# Patient Record
Sex: Male | Born: 1955 | Race: White | Hispanic: No | Marital: Married | Smoking: Former smoker
Health system: Southern US, Community
[De-identification: ages and names within clinical notes are randomized; demographics above are authoritative.]

## PROBLEM LIST (undated history)

## (undated) DIAGNOSIS — R740 Nonspecific elevation of levels of transaminase and lactic acid dehydrogenase [LDH]: Secondary | ICD-10-CM

## (undated) DIAGNOSIS — B009 Herpesviral infection, unspecified: Secondary | ICD-10-CM

## (undated) DIAGNOSIS — A0811 Acute gastroenteropathy due to Norwalk agent: Secondary | ICD-10-CM

## (undated) DIAGNOSIS — M545 Low back pain: Secondary | ICD-10-CM

## (undated) DIAGNOSIS — F1011 Alcohol abuse, in remission: Secondary | ICD-10-CM

## (undated) DIAGNOSIS — E785 Hyperlipidemia, unspecified: Secondary | ICD-10-CM

## (undated) DIAGNOSIS — K219 Gastro-esophageal reflux disease without esophagitis: Secondary | ICD-10-CM

## (undated) HISTORY — DX: Herpesviral infection, unspecified: B00.9

## (undated) HISTORY — DX: Gastro-esophageal reflux disease without esophagitis: K21.9

## (undated) HISTORY — PX: FUNCTIONAL ENDOSCOPIC SINUS SURGERY: SUR616

## (undated) HISTORY — PX: KNEE SURGERY: SHX244

## (undated) HISTORY — PX: TONSILLECTOMY: SUR1361

## (undated) HISTORY — DX: Alcohol abuse, in remission: F10.11

## (undated) HISTORY — PX: OTHER SURGICAL HISTORY: SHX169

## (undated) HISTORY — DX: Hyperlipidemia, unspecified: E78.5

## (undated) HISTORY — DX: Low back pain: M54.5

## (undated) HISTORY — PX: VASECTOMY: SHX75

## (undated) HISTORY — DX: Nonspecific elevation of levels of transaminase and lactic acid dehydrogenase (ldh): R74.0

---

## 2001-07-08 ENCOUNTER — Inpatient Hospital Stay (HOSPITAL_COMMUNITY): Admission: EM | Admit: 2001-07-08 | Discharge: 2001-07-09 | Payer: Self-pay | Admitting: Emergency Medicine

## 2001-07-08 ENCOUNTER — Encounter: Payer: Self-pay | Admitting: Emergency Medicine

## 2003-01-26 ENCOUNTER — Ambulatory Visit (HOSPITAL_BASED_OUTPATIENT_CLINIC_OR_DEPARTMENT_OTHER): Admission: RE | Admit: 2003-01-26 | Discharge: 2003-01-26 | Payer: Self-pay | Admitting: Orthopedic Surgery

## 2003-07-14 ENCOUNTER — Emergency Department (HOSPITAL_COMMUNITY): Admission: EM | Admit: 2003-07-14 | Discharge: 2003-07-14 | Payer: Self-pay | Admitting: Family Medicine

## 2003-07-17 ENCOUNTER — Emergency Department (HOSPITAL_COMMUNITY): Admission: EM | Admit: 2003-07-17 | Discharge: 2003-07-17 | Payer: Self-pay | Admitting: Family Medicine

## 2004-02-18 ENCOUNTER — Ambulatory Visit: Payer: Self-pay | Admitting: Internal Medicine

## 2004-02-26 ENCOUNTER — Ambulatory Visit: Payer: Self-pay | Admitting: Internal Medicine

## 2004-04-29 ENCOUNTER — Ambulatory Visit: Payer: Self-pay | Admitting: Internal Medicine

## 2004-05-24 ENCOUNTER — Ambulatory Visit (HOSPITAL_COMMUNITY): Admission: RE | Admit: 2004-05-24 | Discharge: 2004-05-24 | Payer: Self-pay | Admitting: Orthopedic Surgery

## 2004-07-01 ENCOUNTER — Ambulatory Visit: Payer: Self-pay | Admitting: Internal Medicine

## 2004-11-03 ENCOUNTER — Ambulatory Visit: Payer: Self-pay | Admitting: Internal Medicine

## 2004-11-24 ENCOUNTER — Ambulatory Visit: Payer: Self-pay | Admitting: Internal Medicine

## 2005-05-05 IMAGING — CR DG ORBITS FOR FOREIGN BODY
2 series · 2 of 2 positions shown · non-contrast
Comparison: none

CLINICAL DATA: Metal exposure to the eyes, pre-MRI evaluation.

EYE FOR FOREIGN BODY DETECTION
Two frontal views of the orbits demonstrate no orbital metallic foreign bodies. 
Unremarkable paranasal sinuses.
IMPRESSION
No orbital metallic foreign body.

[view not recorded (1 of 2)]
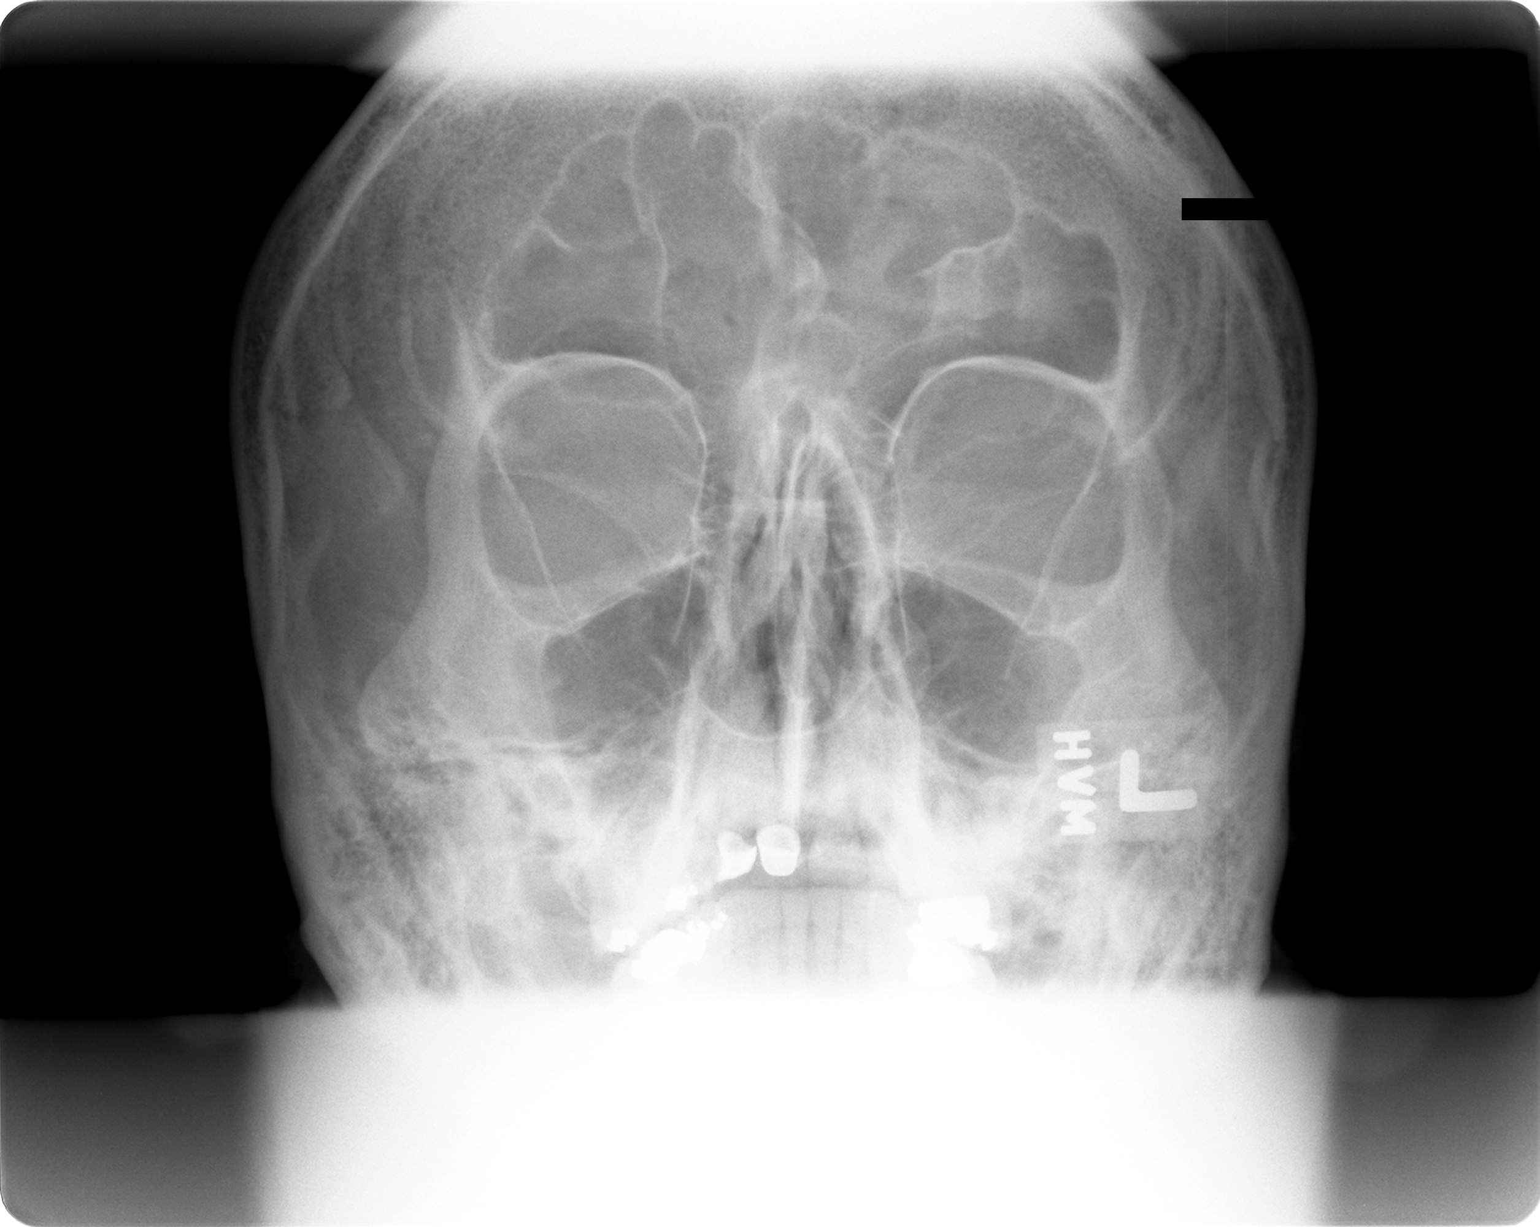

[view not recorded (2 of 2)]
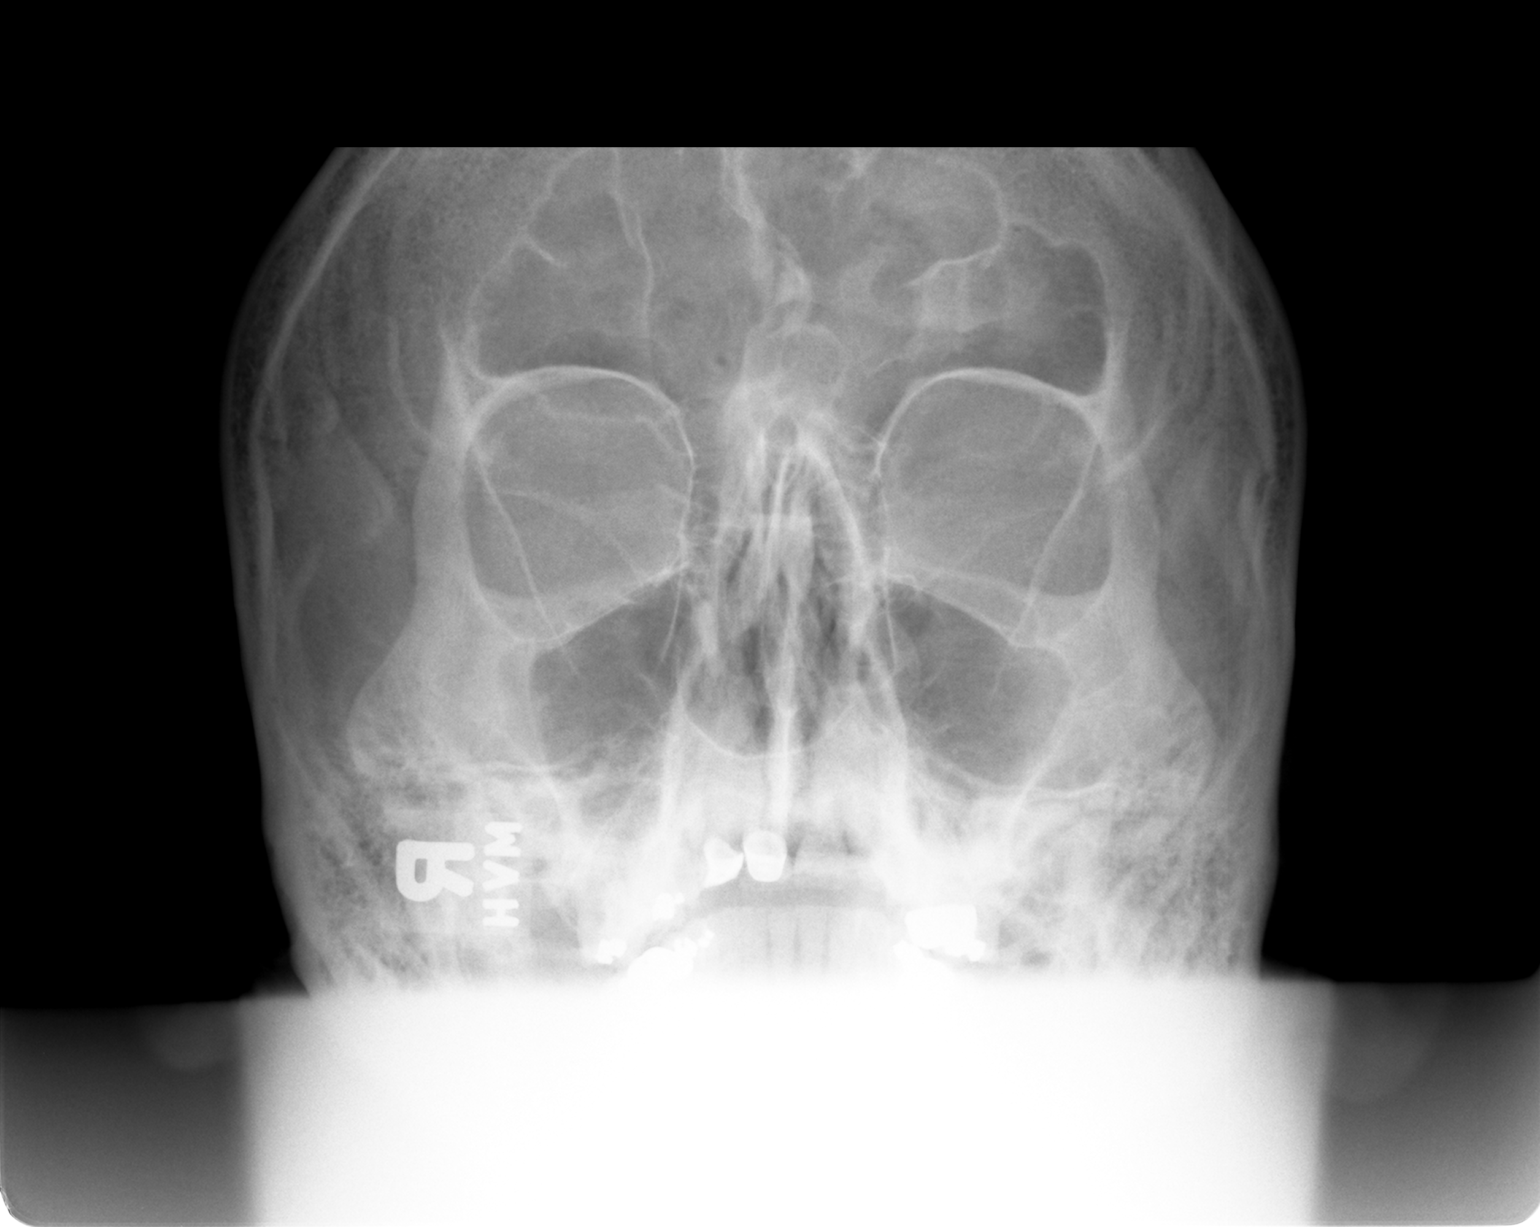

[2 of 2 positions shown; findings below may reference images not displayed]

## 2005-06-24 ENCOUNTER — Ambulatory Visit: Payer: Self-pay | Admitting: Internal Medicine

## 2005-07-25 ENCOUNTER — Emergency Department (HOSPITAL_COMMUNITY): Admission: EM | Admit: 2005-07-25 | Discharge: 2005-07-25 | Payer: Self-pay | Admitting: Family Medicine

## 2005-10-05 ENCOUNTER — Ambulatory Visit: Payer: Self-pay | Admitting: Internal Medicine

## 2006-09-23 ENCOUNTER — Ambulatory Visit: Payer: Self-pay | Admitting: Internal Medicine

## 2006-09-24 DIAGNOSIS — IMO0002 Reserved for concepts with insufficient information to code with codable children: Secondary | ICD-10-CM | POA: Insufficient documentation

## 2006-09-24 DIAGNOSIS — K219 Gastro-esophageal reflux disease without esophagitis: Secondary | ICD-10-CM | POA: Insufficient documentation

## 2006-09-24 DIAGNOSIS — E1165 Type 2 diabetes mellitus with hyperglycemia: Secondary | ICD-10-CM

## 2006-10-07 ENCOUNTER — Telehealth: Payer: Self-pay | Admitting: *Deleted

## 2007-08-01 ENCOUNTER — Emergency Department (HOSPITAL_COMMUNITY): Admission: EM | Admit: 2007-08-01 | Discharge: 2007-08-01 | Payer: Self-pay | Admitting: Emergency Medicine

## 2007-08-15 ENCOUNTER — Ambulatory Visit: Payer: Self-pay | Admitting: Internal Medicine

## 2007-08-15 LAB — CONVERTED CEMR LAB
ALT: 60 units/L — ABNORMAL HIGH (ref 0–53)
AST: 38 units/L — ABNORMAL HIGH (ref 0–37)
Albumin: 3.7 g/dL (ref 3.5–5.2)
Alkaline Phosphatase: 53 units/L (ref 39–117)
BUN: 15 mg/dL (ref 6–23)
Basophils Absolute: 0 10*3/uL (ref 0.0–0.1)
Basophils Relative: 0.1 % (ref 0.0–1.0)
Bilirubin Urine: NEGATIVE
Bilirubin, Direct: 0.1 mg/dL (ref 0.0–0.3)
Blood in Urine, dipstick: NEGATIVE
CO2: 30 meq/L (ref 19–32)
Calcium: 9.8 mg/dL (ref 8.4–10.5)
Chloride: 103 meq/L (ref 96–112)
Cholesterol: 239 mg/dL (ref 0–200)
Creatinine, Ser: 1.2 mg/dL (ref 0.4–1.5)
Direct LDL: 93.7 mg/dL
Eosinophils Absolute: 0.2 10*3/uL (ref 0.0–0.7)
Eosinophils Relative: 3 % (ref 0.0–5.0)
GFR calc Af Amer: 82 mL/min
GFR calc non Af Amer: 68 mL/min
Glucose, Bld: 122 mg/dL — ABNORMAL HIGH (ref 70–99)
Glucose, Urine, Semiquant: NEGATIVE
HCT: 40.6 % (ref 39.0–52.0)
HDL: 40.4 mg/dL (ref >39.0)
Hemoglobin: 14.5 g/dL (ref 13.0–17.0)
Ketones, urine, test strip: NEGATIVE
Lymphocytes Relative: 24.7 % (ref 12.0–46.0)
MCHC: 35.6 g/dL (ref 30.0–36.0)
MCV: 89.6 fL (ref 78.0–100.0)
Monocytes Absolute: 0.6 10*3/uL (ref 0.1–1.0)
Monocytes Relative: 8.5 % (ref 3.0–12.0)
Neutro Abs: 4.8 10*3/uL (ref 1.4–7.7)
Neutrophils Relative %: 63.7 % (ref 43.0–77.0)
Nitrite: NEGATIVE
PSA: 0.67 ng/mL (ref 0.10–4.00)
Platelets: 310 10*3/uL (ref 150–400)
Potassium: 3.5 meq/L (ref 3.5–5.1)
Protein, U semiquant: NEGATIVE
RBC: 4.53 M/uL (ref 4.22–5.81)
RDW: 13.2 % (ref 11.5–14.6)
Sodium: 140 meq/L (ref 135–145)
Specific Gravity, Urine: >=1.03
TSH: 1.11 microintl units/mL (ref 0.35–5.50)
Total Bilirubin: 1.1 mg/dL (ref 0.3–1.2)
Total CHOL/HDL Ratio: 5.9
Total Protein: 7.2 g/dL (ref 6.0–8.3)
Triglycerides: 245 mg/dL (ref 0–149)
Urobilinogen, UA: 0.2
VLDL: 49 mg/dL — ABNORMAL HIGH (ref 0–40)
WBC Urine, dipstick: NEGATIVE
WBC: 7.4 10*3/uL (ref 4.5–10.5)
pH: 5.5

## 2007-08-22 ENCOUNTER — Ambulatory Visit: Payer: Self-pay | Admitting: Internal Medicine

## 2007-08-22 DIAGNOSIS — N419 Inflammatory disease of prostate, unspecified: Secondary | ICD-10-CM | POA: Insufficient documentation

## 2007-08-22 DIAGNOSIS — E785 Hyperlipidemia, unspecified: Secondary | ICD-10-CM

## 2007-08-24 ENCOUNTER — Telehealth: Payer: Self-pay | Admitting: Internal Medicine

## 2007-08-26 ENCOUNTER — Ambulatory Visit: Payer: Self-pay | Admitting: Gastroenterology

## 2007-09-05 ENCOUNTER — Encounter: Payer: Self-pay | Admitting: Gastroenterology

## 2007-09-05 ENCOUNTER — Ambulatory Visit: Payer: Self-pay | Admitting: Gastroenterology

## 2007-09-08 ENCOUNTER — Encounter: Payer: Self-pay | Admitting: Gastroenterology

## 2008-07-06 ENCOUNTER — Telehealth: Payer: Self-pay | Admitting: Internal Medicine

## 2008-08-28 ENCOUNTER — Telehealth: Payer: Self-pay | Admitting: Internal Medicine

## 2008-09-11 ENCOUNTER — Ambulatory Visit: Payer: Self-pay | Admitting: Internal Medicine

## 2008-09-11 LAB — CONVERTED CEMR LAB: Hgb A1c MFr Bld: 7.4 % — ABNORMAL HIGH (ref 4.6–6.5)

## 2009-03-04 ENCOUNTER — Ambulatory Visit: Payer: Self-pay | Admitting: Internal Medicine

## 2009-03-04 DIAGNOSIS — R319 Hematuria, unspecified: Secondary | ICD-10-CM

## 2009-03-04 LAB — CONVERTED CEMR LAB
Bilirubin Urine: NEGATIVE
pH: 5

## 2010-03-31 ENCOUNTER — Ambulatory Visit
Admission: RE | Admit: 2010-03-31 | Discharge: 2010-03-31 | Payer: Self-pay | Source: Home / Self Care | Attending: Family Medicine | Admitting: Family Medicine

## 2010-04-13 LAB — CONVERTED CEMR LAB
Cholesterol, target level: 200 mg/dL
HDL goal, serum: 40 mg/dL
LDL Goal: 100 mg/dL

## 2010-04-17 NOTE — Assessment & Plan Note (Signed)
Summary: COUGH, CONGESTION // RS   Vital Signs:  Patient profile:   55 year old male Weight:      245 pounds O2 Sat:      97 % on Room air Temp:     98.0 degrees F oral Pulse rate:   110 / minute BP sitting:   120 / 80  (left arm) Cuff size:   regular  Vitals Entered By: Duard Brady LPN (March 31, 2010 3:17 PM)  O2 Flow:  Room air CC: c/o head and chest congestion, dry cough Is Patient Diabetic? Yes Did you bring your meter with you today? No   History of Present Illness: Patient seen with one-week history of illness.  Dominant symptoms of cough and nasal congestion. Intermittent sore throat. Cough is especially severe at night. Poor sleep secondary to cough. Cough mostly nonproductive. Some brownish nasal discharge. Possible low-grade fever over the weekend. Continues to have some chills and sweats. Getting ready to travel out of town. Nonsmoker.  Type 2 diabetes relatively stable.  Allergies (verified): No Known Drug Allergies  Past History:  Past Medical History: Last updated: 08/22/2007 GERD Herpes Diabetes mellitus, type II Hyperlipidemia PMH reviewed for relevance  Review of Systems       The patient complains of headaches.  The patient denies anorexia, weight loss, hoarseness, chest pain, syncope, dyspnea on exertion, peripheral edema, and hemoptysis.    Physical Exam  General:  patient coughing off and under an exam but in no respiratory distress Ears:  External ear exam shows no significant lesions or deformities.  Otoscopic examination reveals clear canals, tympanic membranes are intact bilaterally without bulging, retraction, inflammation or discharge. Hearing is grossly normal bilaterally. Mouth:  Oral mucosa and oropharynx without lesions or exudates.  Teeth in good repair. Neck:  No deformities, masses, or tenderness noted. Lungs:  Normal respiratory effort, chest expands symmetrically. Lungs are clear to auscultation, no crackles or  wheezes. Heart:  mild tachycardia.regular rhythm and no gallop.   Skin:  no rashes.   Cervical Nodes:  No lymphadenopathy noted   Impression & Recommendations:  Problem # 1:  URI (ICD-465.9)  His updated medication list for this problem includes:    Tussionex Pennkinetic Er 10-8 Mg/57ml Lqcr (Hydrocod polst-chlorphen polst) ..... One tsp by mouth q 12 hours as needed cough  Complete Medication List: 1)  Glimepiride 2 Mg Tabs (Glimepiride) .... Once daily 2)  Metformin Hcl 500 Mg Tabs (Metformin hcl) .Marland Kitchen.. 1 by mouth two times a day 3)  Azithromycin 250 Mg Tabs (Azithromycin) .... 2 by mouth today then one by mouth once daily for 4 days 4)  Tussionex Pennkinetic Er 10-8 Mg/86ml Lqcr (Hydrocod polst-chlorphen polst) .... One tsp by mouth q 12 hours as needed cough  Patient Instructions: 1)  Get plenty of rest, drink lots of clear liquids, and use Tylenol or Ibuprofen for fever and comfort. Return in 7-10 days if you're not better: sooner if you'er feeling worse.  Prescriptions: TUSSIONEX PENNKINETIC ER 10-8 MG/5ML LQCR (HYDROCOD POLST-CHLORPHEN POLST) one tsp by mouth q 12 hours as needed cough  #120 ml x 0   Entered and Authorized by:   Evelena Peat MD   Signed by:   Evelena Peat MD on 03/31/2010   Method used:   Print then Give to Patient   RxID:   [id number] AZITHROMYCIN 250 MG TABS (AZITHROMYCIN) 2 by mouth today then one by mouth once daily for 4 days  #6 x 0   Entered and Authorized  by:   Evelena Peat MD   Signed by:   Evelena Peat MD on 03/31/2010   Method used:   Print then Give to Patient   RxID:   [id number]    Orders Added: 1)  Est. Patient Level III [95284]

## 2010-05-29 ENCOUNTER — Telehealth: Payer: Self-pay | Admitting: *Deleted

## 2010-05-29 ENCOUNTER — Other Ambulatory Visit: Payer: Self-pay | Admitting: *Deleted

## 2010-05-29 MED ORDER — CIPROFLOXACIN HCL 500 MG PO TABS: 500.0000 mg | ORAL_TABLET | Freq: Two times a day (BID) | ORAL | Status: AC

## 2010-05-29 NOTE — Telephone Encounter (Signed)
Pt is having dysuria, and all the symptoms of prostatitis, and would like a pres. To be called to [facility]  He is in [place], but they will transfer it.

## 2010-05-29 NOTE — Telephone Encounter (Signed)
Per dr Lovell Sheehan- may have cipro 500 bid for 14 days

## 2010-05-29 NOTE — Telephone Encounter (Signed)
Error

## 2010-07-04 ENCOUNTER — Telehealth: Payer: Self-pay | Admitting: Internal Medicine

## 2010-07-04 DIAGNOSIS — E119 Type 2 diabetes mellitus without complications: Secondary | ICD-10-CM

## 2010-07-04 MED ORDER — GLIMEPIRIDE 2 MG PO TABS: 2.0000 mg | ORAL_TABLET | ORAL | Status: DC

## 2010-07-04 NOTE — Telephone Encounter (Signed)
Pt needs new rx glimepiride 2mg  sent to [facility] [phone]. [facility] of town he's back in town.

## 2010-07-21 ENCOUNTER — Other Ambulatory Visit: Payer: Self-pay | Admitting: Internal Medicine

## 2010-08-01 NOTE — Discharge Summary (Signed)
[place]  Patient:    SIGMOND, PATALANO Visit Number: [id number] MRN: [id number]          Service Type: MED Location: 2000 2004 01 Attending Physician:  Tresa Garter Dictated by:   Sonda Primes, M.D. [facility] Admit Date:  07/08/2001 Discharge Date: 07/09/2001   CC:         Stacie Glaze, M.D. [facility]   Discharge Summary  DATE OF BIRTH:  17-Apr-1955  DISCHARGE DIAGNOSES: 1. Cocaine chest pain, myocardial infarction ruled out. 2. Cocaine and alcohol abuse/addiction. 3. Elevated blood pressure. 4. Elevated blood sugar.  HISTORY OF PRESENT ILLNESS:  The patient is a 55 year old male who presented to the ER after using $600 worth of cocaine and drinking a case of beer.  He had some chest pain that has resolved in the emergency room.  For the details, please address my History and Physical.  HOSPITAL COURSE:  During the course of hospitalization, he was observed on telemetry.  His EKG in the morning was normal.  He has not recurrence of the chest pain.  He was placed on schedule of Ativan and some thiamine.  On the day of discharge, he is feeling well.  DISCHARGE PHYSICAL EXAMINATION:  GENERAL:  There is no chest pain or shortness of breath.  HEENT:  Moist mucosa.  NECK:  Supple.  LUNGS:  Clear with no wheezing or rales.  HEART:  Regular S1, S2 with no gallop.  ABDOMEN:  Soft, nontender, no organomegaly.  EXTREMITIES:  Lower extremities without edema.  NEUROLOGIC:  Cranial nerves II-XII intact.  Strength is normal.  She is alert, oriented and cooperative and denies being depressed.  DISCHARGE LABORATORY DATA AND X-RAY FINDINGS:  CK x3 normal, troponin x3 normal.  Hemoglobin 15.6, white count 13.2.  Urine drug screen positive for cocaine.  Urinalysis normal.  Alcohol level 80 (0-10).  Glucose 115.  Vitamin B12 level 597.  TSH 1.58.  EKG normal.  Chest x-ray normal.  DISCHARGE MEDICATIONS: 1. Baby aspirin one a  day. 2. Ativan 1 mg three times a day as needed for anxiety. 3. Multivitamin one a day.  DIET:  Low carbohydrate diet.  SPECIAL INSTRUCTIONS:  Call with problems.  Lose 20 pounds of weight.  Do not use alcohol or cocaine.  FOLLOWUP:  Follow up with Dr. Lovell Sheehan next week.  He will need a Cardiolite stress test as an outpatient. Dictated by:   Sonda Primes, M.D. [facility] Attending Physician:  Tresa Garter DD:  07/09/01 TD:  07/11/01 Job: [id number]

## 2010-08-27 ENCOUNTER — Other Ambulatory Visit (INDEPENDENT_AMBULATORY_CARE_PROVIDER_SITE_OTHER): Payer: PRIVATE HEALTH INSURANCE

## 2010-08-27 DIAGNOSIS — Z0000 Encounter for general adult medical examination without abnormal findings: Secondary | ICD-10-CM

## 2010-08-27 LAB — POCT URINALYSIS DIPSTICK
Bilirubin, UA: NEGATIVE
Leukocytes, UA: NEGATIVE
Nitrite, UA: NEGATIVE
Urobilinogen, UA: 0.2

## 2010-08-27 LAB — CBC WITH DIFFERENTIAL/PLATELET
Basophils Absolute: 0 10*3/uL (ref 0.0–0.1)
Basophils Relative: 0.4 % (ref 0.0–3.0)
Hemoglobin: 14.3 g/dL (ref 13.0–17.0)
Lymphocytes Relative: 22.9 % (ref 12.0–46.0)
Monocytes Relative: 6.7 % (ref 3.0–12.0)
Neutro Abs: 6.1 10*3/uL (ref 1.4–7.7)
Neutrophils Relative %: 67.3 % (ref 43.0–77.0)
RBC: 4.55 Mil/uL (ref 4.22–5.81)
RDW: 14 % (ref 11.5–14.6)

## 2010-08-27 LAB — MICROALBUMIN / CREATININE URINE RATIO
Creatinine,U: 152.1 mg/dL
Microalb, Ur: 3.9 mg/dL — ABNORMAL HIGH (ref 0.0–1.9)

## 2010-08-27 LAB — LIPID PANEL
Total CHOL/HDL Ratio: 6
Triglycerides: 542 mg/dL — ABNORMAL HIGH (ref 0.0–149.0)

## 2010-08-27 LAB — PSA: PSA: 0.61 ng/mL (ref 0.10–4.00)

## 2010-08-27 LAB — TSH: TSH: 1.14 u[IU]/mL (ref 0.35–5.50)

## 2010-08-27 LAB — HEPATIC FUNCTION PANEL
AST: 43 U/L — ABNORMAL HIGH (ref 0–37)
Albumin: 4.3 g/dL (ref 3.5–5.2)
Alkaline Phosphatase: 69 U/L (ref 39–117)
Bilirubin, Direct: 0.1 mg/dL (ref 0.0–0.3)

## 2010-08-27 LAB — BASIC METABOLIC PANEL
Calcium: 9.2 mg/dL (ref 8.4–10.5)
GFR: 90.82 mL/min (ref >60.00)
Sodium: 138 mEq/L (ref 135–145)

## 2010-09-02 ENCOUNTER — Encounter: Payer: Self-pay | Admitting: Internal Medicine

## 2010-09-09 ENCOUNTER — Ambulatory Visit (INDEPENDENT_AMBULATORY_CARE_PROVIDER_SITE_OTHER): Payer: PRIVATE HEALTH INSURANCE | Admitting: Internal Medicine

## 2010-09-09 VITALS — BP 124/80 | HR 86 | Temp 98.5°F | Resp 16 | Ht 67.0 in | Wt 233.0 lb

## 2010-09-09 DIAGNOSIS — E1165 Type 2 diabetes mellitus with hyperglycemia: Secondary | ICD-10-CM

## 2010-09-09 DIAGNOSIS — R7989 Other specified abnormal findings of blood chemistry: Secondary | ICD-10-CM

## 2010-09-09 DIAGNOSIS — F1010 Alcohol abuse, uncomplicated: Secondary | ICD-10-CM

## 2010-09-09 DIAGNOSIS — Z0000 Encounter for general adult medical examination without abnormal findings: Secondary | ICD-10-CM

## 2010-09-09 NOTE — Progress Notes (Signed)
  Subjective:    Patient ID: Rickey Anderson, male    DOB: 20-Feb-1956, 55 y.o.   MRN: [id number]  HPI The pt admits to etoh prior to labs weigth is down Patient has a history of problems with substance abuse including polysubstance abuse    Review of Systems  Constitutional: Negative for fever and fatigue.  HENT: Negative for hearing loss, congestion, neck pain and postnasal drip.   Eyes: Negative for discharge, redness and visual disturbance.  Respiratory: Negative for cough, shortness of breath and wheezing.   Cardiovascular: Negative for leg swelling.  Gastrointestinal: Negative for abdominal pain, constipation and abdominal distention.  Genitourinary: Negative for urgency and frequency.  Musculoskeletal: Negative for joint swelling and arthralgias.  Skin: Negative for color change and rash.  Neurological: Negative for weakness and light-headedness.  Hematological: Negative for adenopathy.  Psychiatric/Behavioral: Negative for behavioral problems.   Past Medical History  Diagnosis Date  . GERD (gastroesophageal reflux disease)   . Diabetes mellitus   . Hyperlipidemia   . Herpes    No past surgical history on file.  reports that he has quit smoking. He does not have any smokeless tobacco history on file. He reports that he drinks alcohol. He reports that he does not use illicit drugs. family history includes Alcohol abuse in an unspecified family member; Depression in an unspecified family member; Heart disease in an unspecified family member; and Hypertension in an unspecified family member. Allergies not on file     Objective:   Physical Exam  Constitutional: He is oriented to person, place, and time. He appears well-developed and well-nourished.  HENT:  Head: Normocephalic and atraumatic.  Eyes: Conjunctivae are normal. Pupils are equal, round, and reactive to light.  Neck: Normal range of motion. Neck supple.  Cardiovascular: Normal rate and regular rhythm.     Pulmonary/Chest: Effort normal and breath sounds normal.  Abdominal: Soft. Bowel sounds are normal.  Genitourinary: Rectum normal and prostate normal.  Musculoskeletal: He exhibits tenderness.  Neurological: He is alert and oriented to person, place, and time. He has normal reflexes.  Skin: Skin is warm and dry.  Psychiatric:       Strong component of denial          Assessment & Plan:  Patient must stop all alcohol.  He has fatty infiltration of his liver with diabetes and hypertension he has a triad of disease which may lead to cirrhosis and eventual liver failure.  His diabetes we are going to increase his Amaryl to 2 mg by mouth twice a day and continue his metformin he is urged to begin a weight loss and alcohol l free program   Patient presents for yearly preventative medicine examination.   all immunizations and health maintenance protocols were reviewed with the patient and they are up to date with these protocols.   screening laboratory values were reviewed with the patient including screening of hyperlipidemia PSA renal function and hepatic function.   There medications past medical history social history problem list and allergies were reviewed in detail.   Goals were established with regard to weight loss exercise diet in compliance with medications

## 2010-09-22 ENCOUNTER — Telehealth: Payer: Self-pay | Admitting: *Deleted

## 2010-09-22 MED ORDER — GLIMEPIRIDE 2 MG PO TABS: 2.0000 mg | ORAL_TABLET | Freq: Two times a day (BID) | ORAL | Status: DC

## 2010-09-22 NOTE — Telephone Encounter (Signed)
Amaryl 2 mg was increased at last ov.  Pt needs rx refill sent to [facility].

## 2010-09-22 NOTE — Telephone Encounter (Signed)
Med sent in.

## 2010-10-13 ENCOUNTER — Other Ambulatory Visit: Payer: Self-pay | Admitting: *Deleted

## 2010-10-13 MED ORDER — METFORMIN HCL 500 MG PO TABS: 500.0000 mg | ORAL_TABLET | Freq: Two times a day (BID) | ORAL | Status: DC

## 2010-10-14 ENCOUNTER — Telehealth: Payer: Self-pay | Admitting: *Deleted

## 2010-10-14 NOTE — Telephone Encounter (Addendum)
Pt is taking all his diabetic meds correctly (Amaryl (generic) is dosage is new.  He is exercising and dieting everyday, and cannot get his BS down below the 200's.  Please call with advice.  He is traveling x 2 weeks, and the number is [phone].  Pt called back, and is very concerned about this.  Talking about going to the pharmacy in [place] and getting name brand Amaryl.Marland Kitchen

## 2010-10-14 NOTE — Telephone Encounter (Signed)
Per dr Lovell Sheehan- increase metformin to 1000mg  twice a day

## 2010-10-14 NOTE — Telephone Encounter (Signed)
Notified pt. 

## 2010-11-04 ENCOUNTER — Inpatient Hospital Stay (INDEPENDENT_AMBULATORY_CARE_PROVIDER_SITE_OTHER)
Admission: RE | Admit: 2010-11-04 | Discharge: 2010-11-04 | Disposition: A | Discharge status: Home / Self Care | Payer: PRIVATE HEALTH INSURANCE | Source: Ambulatory Visit | Attending: Family Medicine | Admitting: Family Medicine

## 2010-11-04 DIAGNOSIS — M545 Low back pain, unspecified: Secondary | ICD-10-CM

## 2010-11-04 DIAGNOSIS — A6000 Herpesviral infection of urogenital system, unspecified: Secondary | ICD-10-CM

## 2010-11-04 DIAGNOSIS — E119 Type 2 diabetes mellitus without complications: Secondary | ICD-10-CM

## 2010-11-04 LAB — POCT URINALYSIS DIP (DEVICE)
Bilirubin Urine: NEGATIVE
Hgb urine dipstick: NEGATIVE
Ketones, ur: NEGATIVE mg/dL
Specific Gravity, Urine: 1.02 (ref 1.005–1.030)
pH: 5.5 (ref 5.0–8.0)

## 2010-11-04 LAB — POCT I-STAT, CHEM 8
BUN: 18 mg/dL (ref 6–23)
Calcium, Ion: 1.23 mmol/L (ref 1.12–1.32)
Chloride: 102 mEq/L (ref 96–112)
Creatinine, Ser: 0.9 mg/dL (ref 0.50–1.35)
Glucose, Bld: 115 mg/dL — ABNORMAL HIGH (ref 70–99)

## 2010-11-07 ENCOUNTER — Other Ambulatory Visit: Payer: Self-pay | Admitting: *Deleted

## 2010-11-07 MED ORDER — METFORMIN HCL 500 MG PO TABS: 1000.0000 mg | ORAL_TABLET | Freq: Two times a day (BID) | ORAL | Status: DC

## 2010-11-20 ENCOUNTER — Encounter: Payer: Self-pay | Admitting: Internal Medicine

## 2010-11-26 ENCOUNTER — Other Ambulatory Visit: Payer: Self-pay | Admitting: *Deleted

## 2010-11-26 MED ORDER — METFORMIN HCL 1000 MG PO TABS: 1000.0000 mg | ORAL_TABLET | Freq: Two times a day (BID) | ORAL | Status: DC

## 2010-11-26 MED ORDER — GLIMEPIRIDE 2 MG PO TABS: 2.0000 mg | ORAL_TABLET | Freq: Two times a day (BID) | ORAL | Status: DC

## 2010-12-04 ENCOUNTER — Telehealth: Payer: Self-pay | Admitting: Internal Medicine

## 2010-12-04 NOTE — Telephone Encounter (Signed)
Pt is sch for a1c on 01-19-2011. Pt would like to add lipid/liver to order. Can I sch?

## 2010-12-04 NOTE — Telephone Encounter (Signed)
Thanks norma

## 2010-12-04 NOTE — Telephone Encounter (Signed)
Yes you may-thanks 272.4

## 2010-12-05 NOTE — Telephone Encounter (Signed)
Pt is aware additional labs has been added to order

## 2010-12-08 ENCOUNTER — Other Ambulatory Visit: Payer: PRIVATE HEALTH INSURANCE

## 2010-12-10 LAB — POCT URINALYSIS DIP (DEVICE)
Bilirubin Urine: NEGATIVE
Ketones, ur: NEGATIVE
Operator id: 208841
Protein, ur: NEGATIVE
Specific Gravity, Urine: 1.025

## 2011-01-19 ENCOUNTER — Other Ambulatory Visit: Payer: PRIVATE HEALTH INSURANCE

## 2011-01-26 ENCOUNTER — Ambulatory Visit: Payer: PRIVATE HEALTH INSURANCE | Admitting: Internal Medicine

## 2011-02-09 ENCOUNTER — Other Ambulatory Visit: Payer: PRIVATE HEALTH INSURANCE

## 2011-02-16 ENCOUNTER — Ambulatory Visit: Payer: PRIVATE HEALTH INSURANCE | Admitting: Internal Medicine

## 2011-03-23 ENCOUNTER — Other Ambulatory Visit: Payer: PRIVATE HEALTH INSURANCE

## 2011-04-06 ENCOUNTER — Ambulatory Visit: Payer: PRIVATE HEALTH INSURANCE | Admitting: Internal Medicine

## 2011-05-04 ENCOUNTER — Ambulatory Visit (INDEPENDENT_AMBULATORY_CARE_PROVIDER_SITE_OTHER): Payer: PRIVATE HEALTH INSURANCE | Admitting: Family Medicine

## 2011-05-04 ENCOUNTER — Encounter: Payer: Self-pay | Admitting: Family Medicine

## 2011-05-04 VITALS — BP 120/84 | Temp 98.6°F | Wt 244.0 lb

## 2011-05-04 DIAGNOSIS — J069 Acute upper respiratory infection, unspecified: Secondary | ICD-10-CM

## 2011-05-04 MED ORDER — HYDROCODONE-HOMATROPINE 5-1.5 MG/5ML PO SYRP: ORAL_SOLUTION | ORAL | Status: DC

## 2011-05-04 NOTE — Progress Notes (Signed)
  Subjective:    Patient ID: Rickey Anderson, male    DOB: 02-13-1956, 56 y.o.   MRN: [id number]  HPI Terrius is a 56 year old married male nonsmoker patient of Dr. Lovell Sheehan who walks in today with symptoms of head congestion and cough for 6 hours!!!!!!!!!!  He has no fever  etc. His wife had a cold last week   Review of Systems General and pulmonary review of systems otherwise negative    Objective:   Physical Exam  Well-developed well-nourished overweight male in no acute distress examination of the HEENT negative neck was supple no adenopathy lungs are clear      Assessment & Plan:  Viral syndrome plan treat symptomatically with Tylenol lots of liquids Hydromet cough syrup

## 2011-05-04 NOTE — Patient Instructions (Signed)
Drink lots of water  Chloraseptic lozenge years helps with a sore throat  Hydromet 1/2-1 teaspoon up to 3 times daily. For cough and cold,,,,,,,,,,,, return when necessary

## 2011-06-07 ENCOUNTER — Other Ambulatory Visit: Payer: Self-pay | Admitting: Internal Medicine

## 2011-08-03 ENCOUNTER — Telehealth: Payer: Self-pay | Admitting: Internal Medicine

## 2011-08-03 NOTE — Telephone Encounter (Signed)
Pt would like to get lipid panel and A1c, and liver levels checked. Please advise

## 2011-08-03 NOTE — Telephone Encounter (Signed)
Pt was scheduled for these in the past but canceled, original appt was scheduled for 12/08/10 and had been scheduled 4 times since and has canceled every time. Please advise

## 2011-08-06 NOTE — Telephone Encounter (Signed)
Has not been seen by dr Lovell Sheehan in over a year- he needs ov and dr Lovell Sheehan can decided what labs he needs--

## 2011-08-11 ENCOUNTER — Telehealth: Payer: Self-pay | Admitting: Internal Medicine

## 2011-08-11 NOTE — Telephone Encounter (Signed)
Office visit given with padonda tomorrow

## 2011-08-11 NOTE — Telephone Encounter (Signed)
Patient came in stating that he is having severe left knee pain due to it being bone against bone. [facility] stated that it has been 4 years since his last visit so they will not prescribe anything. Patient will be seeing a physician at [facility] on 08/21/11. Patient has been taking 4 of the 200mg  ibuprofen and it is not working. Patient states he would like to have something called in or pick up an rx and it does not have to be a narcotic as he is going on a hiking trip Thurs. Please advise.

## 2011-08-12 ENCOUNTER — Encounter: Payer: Self-pay | Admitting: Family

## 2011-08-12 ENCOUNTER — Ambulatory Visit (INDEPENDENT_AMBULATORY_CARE_PROVIDER_SITE_OTHER): Payer: PRIVATE HEALTH INSURANCE | Admitting: Family

## 2011-08-12 VITALS — BP 140/90 | Temp 99.2°F | Wt 240.0 lb

## 2011-08-12 DIAGNOSIS — M25569 Pain in unspecified knee: Secondary | ICD-10-CM

## 2011-08-12 DIAGNOSIS — M1710 Unilateral primary osteoarthritis, unspecified knee: Secondary | ICD-10-CM

## 2011-08-12 MED ORDER — GLIMEPIRIDE 2 MG PO TABS: 2.0000 mg | ORAL_TABLET | Freq: Two times a day (BID) | ORAL | Status: DC

## 2011-08-12 MED ORDER — TRAMADOL HCL 50 MG PO TABS: 50.0000 mg | ORAL_TABLET | Freq: Three times a day (TID) | ORAL | Status: AC | PRN

## 2011-08-12 NOTE — Patient Instructions (Signed)
Knee Pain The knee is the complex joint between your thigh and your lower leg. It is made up of bones, tendons, ligaments, and cartilage. The bones that make up the knee are:  The femur in the thigh.   The tibia and fibula in the lower leg.   The patella or kneecap riding in the groove on the lower femur.  CAUSES  Knee pain is a common complaint with many causes. A few of these causes are:  Injury, such as:   A ruptured ligament or tendon injury.   Torn cartilage.   Medical conditions, such as:   Gout   Arthritis   Infections   Overuse, over training or overdoing a physical activity.  Knee pain can be minor or severe. Knee pain can accompany debilitating injury. Minor knee problems often respond well to self-care measures or get well on their own. More serious injuries may need medical intervention or even surgery. SYMPTOMS The knee is complex. Symptoms of knee problems can vary widely. Some of the problems are:  Pain with movement and weight bearing.   Swelling and tenderness.   Buckling of the knee.   Inability to straighten or extend your knee.   Your knee locks and you cannot straighten it.   Warmth and redness with pain and fever.   Deformity or dislocation of the kneecap.  DIAGNOSIS  Determining what is wrong may be very straight forward such as when there is an injury. It can also be challenging because of the complexity of the knee. Tests to make a diagnosis may include:  Your caregiver taking a history and doing a physical exam.   Routine X-rays can be used to rule out other problems. X-rays will not reveal a cartilage tear. Some injuries of the knee can be diagnosed by:   Arthroscopy a surgical technique by which a small video camera is inserted through tiny incisions on the sides of the knee. This procedure is used to examine and repair internal knee joint problems. Tiny instruments can be used during arthroscopy to repair the torn knee cartilage  (meniscus).   Arthrography is a radiology technique. A contrast liquid is directly injected into the knee joint. Internal structures of the knee joint then become visible on X-ray film.   An MRI scan is a non x-ray radiology procedure in which magnetic fields and a computer produce two- or three-dimensional images of the inside of the knee. Cartilage tears are often visible using an MRI scanner. MRI scans have largely replaced arthrography in diagnosing cartilage tears of the knee.   Blood work.   Examination of the fluid that helps to lubricate the knee joint (synovial fluid). This is done by taking a sample out using a needle and a syringe.  TREATMENT The treatment of knee problems depends on the cause. Some of these treatments are:  Depending on the injury, proper casting, splinting, surgery or physical therapy care will be needed.   Give yourself adequate recovery time. Do not overuse your joints. If you begin to get sore during workout routines, back off. Slow down or do fewer repetitions.   For repetitive activities such as cycling or running, maintain your strength and nutrition.   Alternate muscle groups. For example if you are a weight lifter, work the upper body on one day and the lower body the next.   Either tight or weak muscles do not give the proper support for your knee. Tight or weak muscles do not absorb the stress placed   on the knee joint. Keep the muscles surrounding the knee strong.   Take care of mechanical problems.   If you have flat feet, orthotics or special shoes may help. See your caregiver if you need help.   Arch supports, sometimes with wedges on the inner or outer aspect of the heel, can help. These can shift pressure away from the side of the knee most bothered by osteoarthritis.   A brace called an "unloader" brace also may be used to help ease the pressure on the most arthritic side of the knee.   If your caregiver has prescribed crutches, braces,  wraps or ice, use as directed. The acronym for this is PRICE. This means protection, rest, ice, compression and elevation.   Nonsteroidal anti-inflammatory drugs (NSAID's), can help relieve pain. But if taken immediately after an injury, they may actually increase swelling. Take NSAID's with food in your stomach. Stop them if you develop stomach problems. Do not take these if you have a history of ulcers, stomach pain or bleeding from the bowel. Do not take without your caregiver's approval if you have problems with fluid retention, heart failure, or kidney problems.   For ongoing knee problems, physical therapy may be helpful.   Glucosamine and chondroitin are over-the-counter dietary supplements. Both may help relieve the pain of osteoarthritis in the knee. These medicines are different from the usual anti-inflammatory drugs. Glucosamine may decrease the rate of cartilage destruction.   Injections of a corticosteroid drug into your knee joint may help reduce the symptoms of an arthritis flare-up. They may provide pain relief that lasts a few months. You may have to wait a few months between injections. The injections do have a small increased risk of infection, water retention and elevated blood sugar levels.   Hyaluronic acid injected into damaged joints may ease pain and provide lubrication. These injections may work by reducing inflammation. A series of shots may give relief for as long as 6 months.   Topical painkillers. Applying certain ointments to your skin may help relieve the pain and stiffness of osteoarthritis. Ask your pharmacist for suggestions. Many over the-counter products are approved for temporary relief of arthritis pain.   In some countries, doctors often prescribe topical NSAID's for relief of chronic conditions such as arthritis and tendinitis. A review of treatment with NSAID creams found that they worked as well as oral medications but without the serious side effects.    PREVENTION  Maintain a healthy weight. Extra pounds put more strain on your joints.   Get strong, stay limber. Weak muscles are a common cause of knee injuries. Stretching is important. Include flexibility exercises in your workouts.   Be smart about exercise. If you have osteoarthritis, chronic knee pain or recurring injuries, you may need to change the way you exercise. This does not mean you have to stop being active. If your knees ache after jogging or playing basketball, consider switching to swimming, water aerobics or other low-impact activities, at least for a few days a week. Sometimes limiting high-impact activities will provide relief.   Make sure your shoes fit well. Choose footwear that is right for your sport.   Protect your knees. Use the proper gear for knee-sensitive activities. Use kneepads when playing volleyball or laying carpet. Buckle your seat belt every time you drive. Most shattered kneecaps occur in car accidents.   Rest when you are tired.  SEEK MEDICAL CARE IF:  You have knee pain that is continual and does not   seem to be getting better.  SEEK IMMEDIATE MEDICAL CARE IF:  Your knee joint feels hot to the touch and you have a high fever. MAKE SURE YOU:   Understand these instructions.   Will watch your condition.   Will get help right away if you are not doing well or get worse.  Document Released: 12/28/2006 Document Revised: 02/19/2011 Document Reviewed: 12/28/2006 [employer] Patient Information 2012 [employer]. 

## 2011-08-12 NOTE — Progress Notes (Signed)
Subjective:    Patient ID: Rickey Anderson, male    DOB: 1955-07-10, 56 y.o.   MRN: [id number]  HPI 56 year old Caucasian male, former smoker, presents today with bilateral knee pain. He previously skied professionally and reports having issues with his knees for many years. Over the past 6 months he has noticed the pain increasing, especially following his spinning exercise for cardio. Rates the pain a 10/10 in his left knee and an 8/10 in his right knee and describes it as sore and achy. States he notices swelling to his left knee intermittently. Has an appointment with orthopedics next week.   Review of Systems  Constitutional: Negative.   HENT: Negative.   Eyes: Negative.   Respiratory: Negative.   Cardiovascular: Negative.   Gastrointestinal: Negative.   Genitourinary: Negative.   Musculoskeletal: Positive for joint swelling (intermittent ), arthralgias (bilateral knees) and gait problem (states he limps when pain level elevated).  Skin: Negative.   Neurological: Negative.   Hematological: Negative.   Psychiatric/Behavioral: Negative.    Past Medical History  Diagnosis Date  . GERD (gastroesophageal reflux disease)   . Diabetes mellitus   . Hyperlipidemia   . Herpes     History   Social History  . Marital Status: Married    Spouse Name: N/A    Number of Children: N/A  . Years of Education: N/A   Occupational History  . Not on file.   Social History Main Topics  . Smoking status: Former Games developer  . Smokeless tobacco: Not on file  . Alcohol Use: Yes  . Drug Use: No  . Sexually Active: Not on file   Other Topics Concern  . Not on file   Social History Narrative  . No narrative on file    No past surgical history on file.  Family History  Problem Relation Age of Onset  . Hypertension    . Heart disease    . Alcohol abuse    . Depression      No Known Allergies  Current Outpatient Prescriptions on File Prior to Visit  Medication Sig Dispense  Refill  . metFORMIN (GLUCOPHAGE) 1000 MG tablet Take 1 tablet (1,000 mg total) by mouth 2 (two) times daily.  60 tablet  11  . omeprazole (PRILOSEC) 20 MG capsule Take 20 mg by mouth daily.        Marland Kitchen DISCONTD: glimepiride (AMARYL) 2 MG tablet TAKE ONE TABLET BY MOUTH TWICE DAILY  60 tablet  3  . HYDROcodone-homatropine (HYCODAN) 5-1.5 MG/5ML syrup One half to 1 teaspoon 3 times daily when necessary for cough  240 mL  0    BP 140/90  Temp(Src) 99.2 F (37.3 C) (Oral)  Wt 240 lb (108.863 kg)chart    Objective:   Physical Exam  Constitutional: He is oriented to person, place, and time. He appears well-developed and well-nourished.  Cardiovascular: Normal rate, regular rhythm and normal heart sounds.  Exam reveals no gallop and no friction rub.   No murmur heard. Pulmonary/Chest: Effort normal and breath sounds normal. No respiratory distress. He has no wheezes. He has no rales.  Musculoskeletal:       Right knee: He exhibits normal range of motion, no swelling and no effusion. tenderness found.       Left knee: He exhibits decreased range of motion. He exhibits no swelling and no effusion. tenderness found.  Neurological: He is alert and oriented to person, place, and time.  Assessment & Plan:  Assessment: Bilateral knee pain  Plan: Start Tramadol 50 mg every eight hours as needed. Patient will follow up with orthopedics next week for his scheduled appointment.

## 2011-10-26 ENCOUNTER — Other Ambulatory Visit (INDEPENDENT_AMBULATORY_CARE_PROVIDER_SITE_OTHER): Payer: PRIVATE HEALTH INSURANCE

## 2011-10-26 DIAGNOSIS — Z0000 Encounter for general adult medical examination without abnormal findings: Secondary | ICD-10-CM

## 2011-10-26 LAB — CBC WITH DIFFERENTIAL/PLATELET
Basophils Absolute: 0.1 10*3/uL (ref 0.0–0.1)
Basophils Relative: 0.7 % (ref 0.0–3.0)
Eosinophils Absolute: 0.3 10*3/uL (ref 0.0–0.7)
HCT: 41.1 % (ref 39.0–52.0)
Hemoglobin: 13.5 g/dL (ref 13.0–17.0)
Lymphocytes Relative: 23 % (ref 12.0–46.0)
Lymphs Abs: 1.7 10*3/uL (ref 0.7–4.0)
MCHC: 32.8 g/dL (ref 30.0–36.0)
MCV: 91.6 fl (ref 78.0–100.0)
Monocytes Absolute: 0.7 10*3/uL (ref 0.1–1.0)
Neutro Abs: 4.8 10*3/uL (ref 1.4–7.7)
RBC: 4.49 Mil/uL (ref 4.22–5.81)
RDW: 14.4 % (ref 11.5–14.6)

## 2011-10-26 LAB — MICROALBUMIN / CREATININE URINE RATIO
Creatinine,U: 350.6 mg/dL
Microalb Creat Ratio: 1.8 mg/g (ref 0.0–30.0)

## 2011-10-26 LAB — POCT URINALYSIS DIPSTICK
Leukocytes, UA: NEGATIVE
Spec Grav, UA: >=1.03
pH, UA: 5

## 2011-10-26 LAB — BASIC METABOLIC PANEL
CO2: 30 mEq/L (ref 19–32)
Chloride: 98 mEq/L (ref 96–112)
Glucose, Bld: 175 mg/dL — ABNORMAL HIGH (ref 70–99)
Sodium: 137 mEq/L (ref 135–145)

## 2011-10-26 LAB — HEPATIC FUNCTION PANEL
Albumin: 4.1 g/dL (ref 3.5–5.2)
Alkaline Phosphatase: 73 U/L (ref 39–117)
Total Protein: 7.2 g/dL (ref 6.0–8.3)

## 2011-10-26 LAB — PSA: PSA: 0.59 ng/mL (ref 0.10–4.00)

## 2011-10-26 LAB — LIPID PANEL: Triglycerides: 251 mg/dL — ABNORMAL HIGH (ref 0.0–149.0)

## 2011-10-26 LAB — HEMOGLOBIN A1C: Hgb A1c MFr Bld: 9.9 % — ABNORMAL HIGH (ref 4.6–6.5)

## 2011-10-26 LAB — TSH: TSH: 1.5 u[IU]/mL (ref 0.35–5.50)

## 2011-10-30 ENCOUNTER — Telehealth: Payer: Self-pay | Admitting: Internal Medicine

## 2011-10-30 NOTE — Telephone Encounter (Signed)
Tell pt that dr Lovell Sheehan can check it the day of cpx and we can call him with results

## 2011-10-30 NOTE — Telephone Encounter (Addendum)
Pt was sch for cpx on 11-02-11 had to rsc cpx due to death in family. Pt would like to repeat a1c and any additional labs MD feels is necessary prior to cpx sch on 01-11-2012. Pt already had cpx labs. Pt is concern because cpx is 2 month later

## 2011-10-30 NOTE — Telephone Encounter (Signed)
Pt aware.

## 2011-11-02 ENCOUNTER — Encounter: Payer: PRIVATE HEALTH INSURANCE | Admitting: Internal Medicine

## 2011-11-04 ENCOUNTER — Encounter: Payer: Self-pay | Admitting: Internal Medicine

## 2011-11-04 ENCOUNTER — Ambulatory Visit (INDEPENDENT_AMBULATORY_CARE_PROVIDER_SITE_OTHER): Payer: PRIVATE HEALTH INSURANCE | Admitting: Internal Medicine

## 2011-11-04 ENCOUNTER — Telehealth: Payer: Self-pay | Admitting: Internal Medicine

## 2011-11-04 VITALS — BP 144/94 | Temp 97.7°F | Wt 234.0 lb

## 2011-11-04 DIAGNOSIS — E785 Hyperlipidemia, unspecified: Secondary | ICD-10-CM

## 2011-11-04 DIAGNOSIS — IMO0002 Reserved for concepts with insufficient information to code with codable children: Secondary | ICD-10-CM

## 2011-11-04 DIAGNOSIS — J329 Chronic sinusitis, unspecified: Secondary | ICD-10-CM

## 2011-11-04 DIAGNOSIS — E119 Type 2 diabetes mellitus without complications: Secondary | ICD-10-CM

## 2011-11-04 HISTORY — DX: Reserved for concepts with insufficient information to code with codable children: IMO0002

## 2011-11-04 MED ORDER — AMOXICILLIN-POT CLAVULANATE 875-125 MG PO TABS: 1.0000 | ORAL_TABLET | Freq: Two times a day (BID) | ORAL | Status: DC

## 2011-11-04 MED ORDER — INSULIN PEN NEEDLE 31G X 8 MM MISC: Status: DC

## 2011-11-04 MED ORDER — LIRAGLUTIDE 18 MG/3ML ~~LOC~~ SOLN: SUBCUTANEOUS | Status: DC

## 2011-11-04 NOTE — Telephone Encounter (Signed)
Pt is a [employer] on [address] and is needing to get a rx for One Touch Ultra Mini test strips. Pls call in asap.

## 2011-11-04 NOTE — Assessment & Plan Note (Addendum)
Patient has uncontrolled type 2 diabetes. His last A1c 9.9. Patient urged to start checking his blood sugar in the morning. We discussed natural history of type 2 diabetes. His beta cell function may be declining. Trial of the Victoza.  Decrease glyburide dose by half. Limit carbohydrate intake to 30 g per meal. Reassess in 10 days.  He understands he may require insulin therapy.  Refer to [facility] ophthalmology for diabetic eye exam. Foot exam is normal. He has mild microalbuminuria. Consider starting ARB at next office visit. His last potassium was slightly elevated. This may be associated with over-the-counter NSAID use. Patient advised to discontinue over-the-counter NSAIDs.

## 2011-11-04 NOTE — Progress Notes (Signed)
Subjective:    Patient ID: MAXTON NOREEN, male    DOB: [birth year], 56 y.o.   MRN: [id number]  HPI  56 year old white male with history of type 2 diabetes complains of possible chronic sinusitis for last several weeks.  Patient reports undergoing oral surgery in 09/03/2011. He had 5 teeth extracted that required bone grafts which surgeon took from his right maxillary sinus. 2 weeks after his surgery he has splitting headache and purulent nasal discharge. His nasal discharge also had foul odor. He was treated with Augmentin twice daily for 10 days. His symptoms improved. However patient reports his symptoms of sinus drainage recurring. He periodically has foul-smelling nasal discharge.  He denies fever or chills.  Type II diabetes-patient does not check his home blood pressure readings. His most recent A1c has significantly worsened to 9.9. Patient has sporadic dietary compliance. He is currently taking metformin 1000 mg twice daily and glyburide 2 mg twice daily. Patient reports her at time of his sinus surgery his dietary compliance was poor. Patient also reports receiving cortisone injections in his knees which may have also exacerbated blood sugar control.  Recent lab results reviewed in detail with patient.  Review of Systems Negative for fever or chills. Denies hx of pancreatitis.  He has hx of alcoholism Denies hx of thyroid disorder  Past Medical History  Diagnosis Date  . GERD (gastroesophageal reflux disease)   . Diabetes mellitus   . Hyperlipidemia   . Herpes     History   Social History  . Marital Status: Married    Spouse Name: N/A    Number of Children: N/A  . Years of Education: N/A   Occupational History  . Not on file.   Social History Main Topics  . Smoking status: Former Games developer  . Smokeless tobacco: Not on file  . Alcohol Use: Yes  . Drug Use: No  . Sexually Active: Not on file   Other Topics Concern  . Not on file   Social History Narrative  .  No narrative on file    No past surgical history on file.  Family History  Problem Relation Age of Onset  . Hypertension    . Heart disease    . Alcohol abuse    . Depression      No Known Allergies  Current Outpatient Prescriptions on File Prior to Visit  Medication Sig Dispense Refill  . metFORMIN (GLUCOPHAGE) 1000 MG tablet Take 1 tablet (1,000 mg total) by mouth 2 (two) times daily.  60 tablet  11  . omeprazole (PRILOSEC) 20 MG capsule Take 20 mg by mouth daily.        Marland Kitchen DISCONTD: glimepiride (AMARYL) 2 MG tablet Take 1 tablet (2 mg total) by mouth 2 (two) times daily.  60 tablet  3  . Liraglutide (VICTOZA) 18 MG/3ML SOLN Inject .6 mg once daily for 2 weeks then increase to 1.2 mg once daily  6 mL  1    BP 144/94  Temp 97.7 F (36.5 C) (Oral)  Wt 234 lb (106.142 kg)       Objective:   Physical Exam  Constitutional: He is oriented to person, place, and time. He appears well-developed and well-nourished. No distress.  HENT:  Head: Normocephalic and atraumatic.  Right Ear: External ear normal.  Left Ear: External ear normal.       Yellowish drainage from right nares.  Neck: Neck supple.  Cardiovascular: Normal rate, regular rhythm, normal heart sounds and intact distal pulses.  Pulmonary/Chest: Effort normal and breath sounds normal. He has no wheezes.  Musculoskeletal: He exhibits no edema.  Lymphadenopathy:    He has no cervical adenopathy.  Neurological: He is alert and oriented to person, place, and time. No cranial nerve deficit.  Skin: Skin is warm and dry.       No cracks or fissures of her feet bilaterally. Normal sensation to vibration and temperature.  Psychiatric: He has a normal mood and affect. His behavior is normal.        Assessment & Plan:

## 2011-11-04 NOTE — Assessment & Plan Note (Signed)
56 year old white male underwent oral surgery in June 20th, 2013. He required bone grafts from his right maxillary sinus. The patient had sinusitis as a complication of his surgery. He was treated with 10 day course of Augmentin. He initially improved but now is having recurrence.  Restart Augmentin twice daily. Obtain CT of sinuses.

## 2011-11-04 NOTE — Patient Instructions (Addendum)
Limit your carbohydrate intake to 25-30 grams per meal (75-90 per day) Bring your glucometer to your next follow up appointment.

## 2011-11-04 NOTE — Assessment & Plan Note (Signed)
Patient has mild elevation of his LFTs. He has history of alcohol abuse. His diabetes uncontrolled. Transaminitis likely secondary to fatty liver. Consider obtaining liver ultra sound. We will further discuss at next office visit.

## 2011-11-04 NOTE — Assessment & Plan Note (Signed)
Patient has elevated triglycerides. This is likely consequence of uncontrolled blood sugars. LDL is is acceptable at 77.

## 2011-11-05 ENCOUNTER — Ambulatory Visit (INDEPENDENT_AMBULATORY_CARE_PROVIDER_SITE_OTHER)
Admission: RE | Admit: 2011-11-05 | Discharge: 2011-11-05 | Disposition: A | Discharge status: Home / Self Care | Payer: PRIVATE HEALTH INSURANCE | Source: Ambulatory Visit | Attending: Internal Medicine | Admitting: Internal Medicine

## 2011-11-05 ENCOUNTER — Other Ambulatory Visit: Payer: Self-pay | Admitting: *Deleted

## 2011-11-05 DIAGNOSIS — J329 Chronic sinusitis, unspecified: Secondary | ICD-10-CM

## 2011-11-05 NOTE — Telephone Encounter (Signed)
Called to [phone] with 3 refills

## 2011-11-06 ENCOUNTER — Other Ambulatory Visit: Payer: Self-pay | Admitting: *Deleted

## 2011-11-06 MED ORDER — AMOXICILLIN-POT CLAVULANATE 875-125 MG PO TABS: 1.0000 | ORAL_TABLET | Freq: Two times a day (BID) | ORAL | Status: DC

## 2011-11-06 MED ORDER — GLUCOSE BLOOD VI STRP: ORAL_STRIP | Status: DC

## 2011-11-09 ENCOUNTER — Other Ambulatory Visit: Payer: Self-pay | Admitting: Internal Medicine

## 2011-11-09 ENCOUNTER — Telehealth: Payer: Self-pay | Admitting: Internal Medicine

## 2011-11-09 ENCOUNTER — Encounter: Payer: Self-pay | Admitting: *Deleted

## 2011-11-09 DIAGNOSIS — E119 Type 2 diabetes mellitus without complications: Secondary | ICD-10-CM

## 2011-11-09 NOTE — Telephone Encounter (Signed)
I am sending you this message that goes along with referral I sent you on this pt

## 2011-11-09 NOTE — Telephone Encounter (Addendum)
Pt would like Alc. Pt also needs a referral to Dr Johnston Ebbs retina specialist [phone]. Pt has appt this Friday at 8.30am. Pt will pay out of pocket if eye doctor is not in network with [employer]. Pt saw Dr Artist Pais on Friday who recommended opth appt. Pt make own appt to [facility] on [address] who referred patient to Dr Allyne Gee

## 2011-11-17 ENCOUNTER — Ambulatory Visit (INDEPENDENT_AMBULATORY_CARE_PROVIDER_SITE_OTHER): Payer: PRIVATE HEALTH INSURANCE | Admitting: Internal Medicine

## 2011-11-17 ENCOUNTER — Encounter: Payer: Self-pay | Admitting: Internal Medicine

## 2011-11-17 VITALS — BP 122/84 | Temp 98.9°F | Wt 227.0 lb

## 2011-11-17 DIAGNOSIS — J329 Chronic sinusitis, unspecified: Secondary | ICD-10-CM

## 2011-11-17 DIAGNOSIS — E119 Type 2 diabetes mellitus without complications: Secondary | ICD-10-CM

## 2011-11-17 MED ORDER — LIRAGLUTIDE 18 MG/3ML ~~LOC~~ SOLN: 0.6000 mg | Freq: Every day | SUBCUTANEOUS | Status: DC

## 2011-11-17 NOTE — Patient Instructions (Addendum)
Please follow up with Dr. Lovell Sheehan in October, 2013 Stop taking glimepiride. Call out office if your blood sugars trend upward ( greater than 200)

## 2011-11-17 NOTE — Progress Notes (Signed)
  Subjective:    Patient ID: Rickey Anderson, male    DOB: 1955-06-11, 56 y.o.   MRN: [id number]  HPI  56 year old white male with type 2 diabetes previously seen for chronic sinusitis for followup. At previous visit patient was started on Victoza. He experienced nausea for the first 4 days after starting injections but then nausea resolved.  His blood sugars have significantly improved. Blood sugar log reviewed. His morning blood sugars are now less than 100. He has had one episode where his blood sugar was 76. He decreased his glimepiride dose to 1 mg as directed.  Chronic sinusitis-CT results reviewed. Patient has been taking Augmentin for 3 weeks. She denies any current nasal discharge. He did not start saline sinus rhythm as directed.  Diabetic eye exam was performed by Dr. Allyne Gee. It was negative for diabetic retinopathy.  Review of Systems Negative for abdominal pain or vomiting Negative for fever or chills  Past Medical History  Diagnosis Date  . GERD (gastroesophageal reflux disease)   . Diabetes mellitus   . Hyperlipidemia   . Herpes     History   Social History  . Marital Status: Married    Spouse Name: N/A    Number of Children: N/A  . Years of Education: N/A   Occupational History  . Not on file.   Social History Main Topics  . Smoking status: Former Games developer  . Smokeless tobacco: Not on file  . Alcohol Use: Yes  . Drug Use: No  . Sexually Active: Not on file   Other Topics Concern  . Not on file   Social History Narrative  . No narrative on file    No past surgical history on file.  Family History  Problem Relation Age of Onset  . Hypertension    . Heart disease    . Alcohol abuse    . Depression      No Known Allergies  Current Outpatient Prescriptions on File Prior to Visit  Medication Sig Dispense Refill  . glucose blood test strip Use as instructed  100 each  12  . Insulin Pen Needle (FIFTY50 PEN NEEDLES) 31G X 8 MM MISC Use once  daily as directed  100 each  3  . metFORMIN (GLUCOPHAGE) 1000 MG tablet Take 1 tablet (1,000 mg total) by mouth 2 (two) times daily.  60 tablet  11  . omeprazole (PRILOSEC) 20 MG capsule Take 20 mg by mouth daily.        Marland Kitchen DISCONTD: Liraglutide (VICTOZA) 18 MG/3ML SOLN Inject .6 mg once daily for 2 weeks then increase to 1.2 mg once daily  6 mL  1    BP 122/84  Temp 98.9 F (37.2 C) (Oral)  Wt 227 lb (102.967 kg)       Objective:   Physical Exam  Constitutional: He is oriented to person, place, and time. He appears well-developed and well-nourished. No distress.  Neck:       No carotid bruit  Cardiovascular: Normal rate, regular rhythm and normal heart sounds.   Pulmonary/Chest: Effort normal and breath sounds normal. He has no wheezes.  Neurological: He is alert and oriented to person, place, and time.  Skin: Skin is warm and dry.  Psychiatric: He has a normal mood and affect. His behavior is normal.          Assessment & Plan:

## 2011-11-17 NOTE — Assessment & Plan Note (Signed)
Improved.   Complete three-week course of Augmentin 875/125 twice daily. Use saline sinus rinse as directed.

## 2011-11-17 NOTE — Assessment & Plan Note (Signed)
Improved. Continue Victoza 0.6 mg once daily along with metformin. Discontinue glyburide. Patient advised to call if upward trend in his blood sugars after stopping sulfonylurea. A1c before next OV with PCP.

## 2011-11-24 ENCOUNTER — Telehealth: Payer: Self-pay | Admitting: Family Medicine

## 2011-11-24 NOTE — Telephone Encounter (Signed)
Yes.  Stay on evening dose of amaryl until he seen by Dr. Lovell Sheehan

## 2011-11-24 NOTE — Telephone Encounter (Signed)
Patient was here 9/3 and saw Dr. Artist Pais. Is on Victoza in hopes of stopping Amaryl (taking BID and then taking once every night with the Victoza). He has stopped the Amaryl, but since he has done so (for 2 nights), his blood sugar has gone up quite a bit (from normal levels 90-107 to 173). He is traveling for business, which he does every 2wks, so not eating as clean, but is concerned. Should he up the Victoza to 1.2, or stay on the Amaryl? Please advise and call pt. Pt has gone back to taking 1 Amaryl at night d/t high blood sugar.

## 2011-11-25 NOTE — Telephone Encounter (Signed)
Spoke with [name]. He understands and will continue taking 1 Amaryl nightly with the Victoza until his appt with Dr. Lovell Sheehan on 10/28.

## 2011-12-12 ENCOUNTER — Other Ambulatory Visit: Payer: Self-pay | Admitting: Family

## 2011-12-12 ENCOUNTER — Other Ambulatory Visit: Payer: Self-pay | Admitting: Internal Medicine

## 2011-12-14 MED ORDER — GLIMEPIRIDE 2 MG PO TABS: 2.0000 mg | ORAL_TABLET | Freq: Two times a day (BID) | ORAL | Status: DC

## 2011-12-14 NOTE — Addendum Note (Signed)
Addended by: Beverely Low on: 12/14/2011 09:34 AM   Modules accepted: Orders

## 2011-12-28 ENCOUNTER — Other Ambulatory Visit: Payer: Self-pay | Admitting: *Deleted

## 2011-12-28 MED ORDER — GLIMEPIRIDE 2 MG PO TABS: 2.0000 mg | ORAL_TABLET | Freq: Two times a day (BID) | ORAL | Status: DC

## 2012-01-04 ENCOUNTER — Ambulatory Visit (INDEPENDENT_AMBULATORY_CARE_PROVIDER_SITE_OTHER): Payer: PRIVATE HEALTH INSURANCE | Admitting: Internal Medicine

## 2012-01-04 ENCOUNTER — Other Ambulatory Visit (INDEPENDENT_AMBULATORY_CARE_PROVIDER_SITE_OTHER): Payer: PRIVATE HEALTH INSURANCE

## 2012-01-04 VITALS — BP 144/88 | HR 96 | Temp 97.9°F | Wt 230.0 lb

## 2012-01-04 DIAGNOSIS — J329 Chronic sinusitis, unspecified: Secondary | ICD-10-CM

## 2012-01-04 DIAGNOSIS — Z0000 Encounter for general adult medical examination without abnormal findings: Secondary | ICD-10-CM

## 2012-01-04 LAB — POCT URINALYSIS DIPSTICK
Blood, UA: NEGATIVE
Leukocytes, UA: NEGATIVE
Spec Grav, UA: >=1.03
pH, UA: 5

## 2012-01-04 LAB — CBC WITH DIFFERENTIAL/PLATELET
Basophils Absolute: 0.1 10*3/uL (ref 0.0–0.1)
Lymphocytes Relative: 23.9 % (ref 12.0–46.0)
Monocytes Relative: 6.9 % (ref 3.0–12.0)
Platelets: 378 10*3/uL (ref 150.0–400.0)
RDW: 13.5 % (ref 11.5–14.6)

## 2012-01-04 LAB — LIPID PANEL
Cholesterol: 169 mg/dL (ref 0–200)
HDL: 39.7 mg/dL (ref >39.00)
Triglycerides: 165 mg/dL — ABNORMAL HIGH (ref 0.0–149.0)
VLDL: 33 mg/dL (ref 0.0–40.0)

## 2012-01-04 LAB — BASIC METABOLIC PANEL
BUN: 12 mg/dL (ref 6–23)
Calcium: 9.7 mg/dL (ref 8.4–10.5)
GFR: 106.19 mL/min (ref >60.00)
Glucose, Bld: 134 mg/dL — ABNORMAL HIGH (ref 70–99)
Sodium: 137 mEq/L (ref 135–145)

## 2012-01-04 LAB — HEPATIC FUNCTION PANEL
AST: 25 U/L (ref 0–37)
Albumin: 3.6 g/dL (ref 3.5–5.2)
Alkaline Phosphatase: 67 U/L (ref 39–117)

## 2012-01-04 LAB — MICROALBUMIN / CREATININE URINE RATIO: Creatinine,U: 331.2 mg/dL

## 2012-01-04 MED ORDER — LEVOFLOXACIN 500 MG PO TABS: 500.0000 mg | ORAL_TABLET | Freq: Every day | ORAL | Status: DC

## 2012-01-04 NOTE — Progress Notes (Signed)
Subjective:    Patient ID: Rickey Anderson, male    DOB: 1955-06-12, 56 y.o.   MRN: [id number]  HPI  56 year old white male previously seen for chronic sinusitis for follow up. Patient was started on three-week course of Augmentin. Patient sinus symptoms improved while he was taking antibiotics but within approximately one week after stopping Augmentin his sinus symptoms gradually returned. Over last 2 weeks his symptoms have been gradually worsening. He was seen at urgent care in [place]. He was restarted on Augmentin and is feeling somewhat better. He has pain in the right side of his face. He is also having purulent, malodorous nasal discharge.  Review of Systems Negative for fever or chills, negative for visual changes  Past Medical History  Diagnosis Date  . GERD (gastroesophageal reflux disease)   . Diabetes mellitus   . Hyperlipidemia   . Herpes     History   Social History  . Marital Status: Married    Spouse Name: N/A    Number of Children: N/A  . Years of Education: N/A   Occupational History  . Not on file.   Social History Main Topics  . Smoking status: Former Games developer  . Smokeless tobacco: Not on file  . Alcohol Use: Yes  . Drug Use: No  . Sexually Active: Not on file   Other Topics Concern  . Not on file   Social History Narrative  . No narrative on file    No past surgical history on file.  Family History  Problem Relation Age of Onset  . Hypertension    . Heart disease    . Alcohol abuse    . Depression      No Known Allergies  Current Outpatient Prescriptions on File Prior to Visit  Medication Sig Dispense Refill  . glimepiride (AMARYL) 2 MG tablet Take 1 tablet (2 mg total) by mouth 2 (two) times daily.  60 tablet  2  . glucose blood test strip Use as instructed  100 each  12  . Insulin Pen Needle (FIFTY50 PEN NEEDLES) 31G X 8 MM MISC Use once daily as directed  100 each  3  . metFORMIN (GLUCOPHAGE) 1000 MG tablet TAKE ONE TABLET BY  MOUTH TWICE DAILY  60 tablet  2  . omeprazole (PRILOSEC) 20 MG capsule Take 20 mg by mouth daily.        Marland Kitchen DISCONTD: Liraglutide (VICTOZA) 18 MG/3ML SOLN Inject 0.1 mLs (0.6 mg total) into the skin daily.  6 mL  3    BP 144/88  Pulse 96  Temp 97.9 F (36.6 C) (Oral)  Wt 230 lb (104.327 kg)  SpO2 93%       Objective:   Physical Exam  Constitutional: He is oriented to person, place, and time. He appears well-developed and well-nourished.  HENT:  Head: Normocephalic and atraumatic.  Right Ear: External ear normal.  Left Ear: External ear normal.  Mouth/Throat: Oropharynx is clear and moist.       Purulent drainage noticeable around right nasal turbinates.  Eyes: Conjunctivae normal and EOM are normal. Pupils are equal, round, and reactive to light.  Neck: Neck supple.  Cardiovascular: Normal rate, regular rhythm and normal heart sounds.   Pulmonary/Chest: Effort normal and breath sounds normal. He has no wheezes.  Lymphadenopathy:    He has no cervical adenopathy.  Neurological: He is alert and oriented to person, place, and time. No cranial nerve deficit.          Assessment &  Plan:

## 2012-01-04 NOTE — Patient Instructions (Signed)
Hold your glimepiride while you are taking Levaquin. Our office will contact you Re: referral to ENT Please call our office if your symptoms do not improve or gets worse.

## 2012-01-04 NOTE — Assessment & Plan Note (Signed)
Patient finished three-week course of Augmentin. His sinusitis symptoms gradually returned. He he is again experiencing purulent foul smelling nasal discharge and right-sided facial pain. Treat with Levaquin 500 mg once daily for 10 days. Refer to ENT for further evaluation and treatment.

## 2012-01-11 ENCOUNTER — Ambulatory Visit (INDEPENDENT_AMBULATORY_CARE_PROVIDER_SITE_OTHER): Payer: PRIVATE HEALTH INSURANCE | Admitting: Internal Medicine

## 2012-01-11 ENCOUNTER — Encounter: Payer: Self-pay | Admitting: Internal Medicine

## 2012-01-11 VITALS — BP 146/84 | HR 80 | Temp 98.3°F | Resp 16 | Ht 67.0 in | Wt 230.0 lb

## 2012-01-11 DIAGNOSIS — Z0000 Encounter for general adult medical examination without abnormal findings: Secondary | ICD-10-CM

## 2012-01-11 NOTE — Patient Instructions (Signed)
The patient is instructed to continue all medications as prescribed. Schedule followup with check out clerk upon leaving the clinic  

## 2012-01-11 NOTE — Progress Notes (Signed)
Subjective:    Patient ID: Rickey Anderson, male    DOB: Jul 19, 1955, 56 y.o.   MRN: [id number]  HPI Patient has chronic sinusitis was placed on Levaquin 500 milligrams by mouth daily as an appointment with an ear nose and throat he relates to chronic sinusitis to the onset of symptoms following dental implants and dental surgery.  He has adult-onset diabetes with obesity he has been on injectable agent with good response his A1c has dropped 2 points and his weight is dropping he presents today for complete physical examination   Review of Systems  Constitutional: Negative for fever and fatigue.       Morbidly obese white male  HENT: Negative for hearing loss, congestion, neck pain and postnasal drip.   Eyes: Negative for discharge, redness and visual disturbance.  Respiratory: Negative for cough, shortness of breath and wheezing.   Cardiovascular: Negative for leg swelling.  Gastrointestinal: Negative for abdominal pain, constipation and abdominal distention.  Genitourinary: Negative for urgency and frequency.  Musculoskeletal: Negative for joint swelling and arthralgias.  Skin: Negative for color change and rash.  Neurological: Negative for weakness and light-headedness.  Hematological: Negative for adenopathy.  Psychiatric/Behavioral: Negative for behavioral problems.   Past Medical History  Diagnosis Date  . GERD (gastroesophageal reflux disease)   . Diabetes mellitus   . Hyperlipidemia   . Herpes     History   Social History  . Marital Status: Married    Spouse Name: N/A    Number of Children: N/A  . Years of Education: N/A   Occupational History  . Not on file.   Social History Main Topics  . Smoking status: Former Games developer  . Smokeless tobacco: Not on file  . Alcohol Use: Yes  . Drug Use: No  . Sexually Active: Not on file   Other Topics Concern  . Not on file   Social History Narrative  . No narrative on file    No past surgical history on  file.  Family History  Problem Relation Age of Onset  . Hypertension    . Heart disease    . Alcohol abuse    . Depression      No Known Allergies  Current Outpatient Prescriptions on File Prior to Visit  Medication Sig Dispense Refill  . glimepiride (AMARYL) 2 MG tablet Take 1 tablet (2 mg total) by mouth 2 (two) times daily.  60 tablet  2  . glucose blood test strip Use as instructed  100 each  12  . Insulin Pen Needle (FIFTY50 PEN NEEDLES) 31G X 8 MM MISC Use once daily as directed  100 each  3  . levofloxacin (LEVAQUIN) 500 MG tablet Take 1 tablet (500 mg total) by mouth daily.  10 tablet  0  . Liraglutide (VICTOZA) 18 MG/3ML SOLN Inject 0.2 mLs (1.2 mg total) into the skin daily.  6 mL  3  . metFORMIN (GLUCOPHAGE) 1000 MG tablet TAKE ONE TABLET BY MOUTH TWICE DAILY  60 tablet  2  . omeprazole (PRILOSEC) 20 MG capsule Take 20 mg by mouth daily.          BP 146/84  Pulse 80  Temp 98.3 F (36.8 C)  Resp 16  Ht 5\' 7"  (1.702 m)  Wt 230 lb (104.327 kg)  BMI 36.02 kg/m2       Objective:   Physical Exam  Constitutional: He appears well-developed and well-nourished.  HENT:  Head: Normocephalic and atraumatic.  Eyes: Conjunctivae normal are normal.  Pupils are equal, round, and reactive to light.  Neck: Normal range of motion. Neck supple.  Cardiovascular: Normal rate.        Frequent PACs  Pulmonary/Chest: Effort normal and breath sounds normal.  Abdominal: Soft. Bowel sounds are normal.          Assessment & Plan:   Patient presents for yearly preventative medicine examination.   all immunizations and health maintenance protocols were reviewed with the patient and they are up to date with these protocols.   screening laboratory values were reviewed with the patient including screening of hyperlipidemia PSA renal function and hepatic function.   There medications past medical history social history problem list and allergies were reviewed in detail.   Goals  were established with regard to weight loss exercise diet in compliance with medications

## 2012-02-04 ENCOUNTER — Other Ambulatory Visit: Payer: Self-pay | Admitting: Otolaryngology

## 2012-03-21 ENCOUNTER — Encounter (HOSPITAL_COMMUNITY): Payer: Self-pay | Admitting: Emergency Medicine

## 2012-03-21 ENCOUNTER — Emergency Department (HOSPITAL_COMMUNITY)
Admission: EM | Admit: 2012-03-21 | Discharge: 2012-03-21 | Disposition: A | Discharge status: Home / Self Care | Payer: PRIVATE HEALTH INSURANCE | Source: Home / Self Care | Attending: Emergency Medicine | Admitting: Emergency Medicine

## 2012-03-21 DIAGNOSIS — J209 Acute bronchitis, unspecified: Secondary | ICD-10-CM

## 2012-03-21 MED ORDER — AZITHROMYCIN 250 MG PO TABS: ORAL_TABLET | ORAL | Status: DC

## 2012-03-21 MED ORDER — HYDROCOD POLST-CHLORPHEN POLST 10-8 MG/5ML PO LQCR: 5.0000 mL | Freq: Two times a day (BID) | ORAL | Status: DC | PRN

## 2012-03-21 MED ORDER — ALBUTEROL SULFATE HFA 108 (90 BASE) MCG/ACT IN AERS: 1.0000 | INHALATION_SPRAY | Freq: Four times a day (QID) | RESPIRATORY_TRACT | Status: DC | PRN

## 2012-03-21 NOTE — ED Notes (Signed)
Pt c/o cold sx x1 day.  Sx include: sore throat and dry cough Denies: fevers, vomiting, nauseas, diarrhea He is alert w/no signs of acute distress.

## 2012-03-21 NOTE — ED Provider Notes (Signed)
Chief Complaint  Patient presents with  . URI    History of Present Illness:   Rickey Anderson is a 57 year old diabetic male who went skiing in [place] over the weekend and then yesterday developed a nonproductive cough, slight wheezing, sore throat, chills, nasal congestion, and rhinorrhea with clear drainage. He denies fever, headache, ear pain, or GI symptoms. He has not been exposed to anything in particular. He has not tried any medication for symptomatic relief.  Review of Systems:  Other than noted above, the patient denies any of the following symptoms. Systemic:  No fever, chills, sweats, fatigue, myalgias, headache, or anorexia. Eye:  No redness, pain or drainage. ENT:  No earache, ear congestion, nasal congestion, sneezing, rhinorrhea, sinus pressure, sinus pain, post nasal drip, or sore throat. Lungs:  No cough, sputum production, wheezing, shortness of breath, or chest pain. GI:  No abdominal pain, nausea, vomiting, or diarrhea.  PMFSH:  Past medical history, family history, social history, meds, and allergies were reviewed.  Physical Exam:   Vital signs:  BP 136/95  Pulse 124  Temp 98.5 F (36.9 C) (Oral)  Resp 20  SpO2 97% General:  Alert, in no distress. Eye:  No conjunctival injection or drainage. Lids were normal. ENT:  TMs and canals were normal, without erythema or inflammation.  Nasal mucosa was clear and uncongested, without drainage.  Mucous membranes were moist.  Pharynx was clear, without exudate or drainage.  There were no oral ulcerations or lesions. Neck:  Supple, no adenopathy, tenderness or mass. Lungs:  No respiratory distress.  Lungs were clear to auscultation, without wheezes, rales or rhonchi.  Breath sounds were clear and equal bilaterally.  Heart:  Regular rhythm, without gallops, murmers or rubs. Skin:  Clear, warm, and dry, without rash or lesions.  Assessment:  The encounter diagnosis was Acute bronchitis.  Plan:   1.  The following meds were  prescribed:   New Prescriptions   ALBUTEROL (PROVENTIL HFA;VENTOLIN HFA) 108 (90 BASE) MCG/ACT INHALER    Inhale 1-2 puffs into the lungs every 6 (six) hours as needed for wheezing.   AZITHROMYCIN (ZITHROMAX Z-PAK) 250 MG TABLET    Take as directed.   CHLORPHENIRAMINE-HYDROCODONE (TUSSIONEX) 10-8 MG/5ML LQCR    Take 5 mLs by mouth every 12 (twelve) hours as needed.   2.  The patient was instructed in symptomatic care and handouts were given. 3.  The patient was told to return if becoming worse in any way, if no better in 3 or 4 days, and given some red flag symptoms that would indicate earlier return.   Reuben Likes, MD 03/21/12 [phone]

## 2012-04-11 ENCOUNTER — Other Ambulatory Visit: Payer: PRIVATE HEALTH INSURANCE

## 2012-04-18 ENCOUNTER — Ambulatory Visit: Payer: PRIVATE HEALTH INSURANCE | Admitting: Internal Medicine

## 2012-04-23 ENCOUNTER — Other Ambulatory Visit: Payer: Self-pay | Admitting: Family

## 2012-04-23 ENCOUNTER — Other Ambulatory Visit: Payer: Self-pay | Admitting: Internal Medicine

## 2012-04-25 ENCOUNTER — Other Ambulatory Visit: Payer: Self-pay | Admitting: Internal Medicine

## 2012-04-26 ENCOUNTER — Other Ambulatory Visit: Payer: Self-pay | Admitting: *Deleted

## 2012-04-26 ENCOUNTER — Encounter (HOSPITAL_COMMUNITY): Payer: Self-pay | Admitting: *Deleted

## 2012-04-26 ENCOUNTER — Emergency Department (HOSPITAL_COMMUNITY)
Admission: EM | Admit: 2012-04-26 | Discharge: 2012-04-26 | Disposition: A | Discharge status: Home / Self Care | Payer: PRIVATE HEALTH INSURANCE | Source: Home / Self Care | Attending: Family Medicine | Admitting: Family Medicine

## 2012-04-26 DIAGNOSIS — K589 Irritable bowel syndrome without diarrhea: Secondary | ICD-10-CM

## 2012-04-26 HISTORY — DX: Acute gastroenteropathy due to Norwalk agent: A08.11

## 2012-04-26 MED ORDER — METFORMIN HCL 1000 MG PO TABS: 1000.0000 mg | ORAL_TABLET | Freq: Two times a day (BID) | ORAL | Status: DC

## 2012-04-26 MED ORDER — ONDANSETRON 4 MG PO TBDP
4.0000 mg | ORAL_TABLET | Freq: Once | ORAL | Status: AC
  Administered 2012-04-26: 4 mg via ORAL

## 2012-04-26 MED ORDER — GLIMEPIRIDE 2 MG PO TABS: 2.0000 mg | ORAL_TABLET | Freq: Two times a day (BID) | ORAL | Status: DC

## 2012-04-26 MED ORDER — ONDANSETRON 4 MG PO TBDP
ORAL_TABLET | ORAL | Status: AC
  Filled 2012-04-26: qty 1

## 2012-04-26 NOTE — ED Notes (Signed)
C/o LUQ abdominal pain onset yesterday.  He ate lamb from [employer] yesterday afternoon.  He got bad indigestion and pain has moved down to L side of abdomen.  C/o nausea, but V or D.  Had Norovirus last week but has been well since last Wed.  Eating without any problems.  No chills or fever.

## 2012-04-26 NOTE — ED Provider Notes (Signed)
History     CSN: [id number]  Arrival date & time 04/26/12  1818   First MD Initiated Contact with Patient 04/26/12 1835      Chief Complaint  Patient presents with  . Abdominal Pain    (Consider location/radiation/quality/duration/timing/severity/associated sxs/prior treatment) Patient is a 57 y.o. male presenting with abdominal pain. The history is provided by the patient.  Abdominal Pain Pain location:  LLQ Pain quality: cramping   Pain severity:  Mild Duration:  1 day Progression:  Unchanged Chronicity:  New Context comment:  Onset after eating lunch at rest[employer]unt Associated symptoms: nausea   Associated symptoms: no constipation, no diarrhea and no vomiting     Past Medical History  Diagnosis Date  . GERD (gastroesophageal reflux disease)   . Diabetes mellitus   . Hyperlipidemia   . Herpes   . Norovirus     Past Surgical History  Procedure Laterality Date  . Knee surgery      both knees    Family History  Problem Relation Age of Onset  . Hypertension    . Heart disease    . Alcohol abuse    . Depression      History  Substance Use Topics  . Smoking status: Former Games developer  . Smokeless tobacco: Not on file  . Alcohol Use: No      Review of Systems  Constitutional: Negative.   Gastrointestinal: Positive for nausea and abdominal pain. Negative for vomiting, diarrhea, constipation, blood in stool and anal bleeding.  Genitourinary: Negative.     Allergies  Review of patient's allergies indicates no known allergies.  Home Medications   Current Outpatient Rx  Name  Route  Sig  Dispense  Refill  . albuterol (PROVENTIL HFA;VENTOLIN HFA) 108 (90 BASE) MCG/ACT inhaler   Inhalation   Inhale 1-2 puffs into the lungs every 6 (six) hours as needed for wheezing.   1 Inhaler   0   . glimepiride (AMARYL) 2 MG tablet   Oral   Take 1 tablet (2 mg total) by mouth 2 (two) times daily.   180 tablet   3   . glucose blood test strip      Use as  instructed   100 each   12   . Insulin Pen Needle (FIFTY50 PEN NEEDLES) 31G X 8 MM MISC      Use once daily as directed   100 each   3   . Liraglutide (VICTOZA) 18 MG/3ML SOLN   Subcutaneous   Inject 0.2 mLs (1.2 mg total) into the skin daily.   6 mL   3   . metFORMIN (GLUCOPHAGE) 1000 MG tablet   Oral   Take 1 tablet (1,000 mg total) by mouth 2 (two) times daily with a meal.   60 tablet   11   . omeprazole (PRILOSEC) 20 MG capsule   Oral   Take 20 mg by mouth daily.           Marland Kitchen azithromycin (ZITHROMAX Z-PAK) 250 MG tablet      Take as directed.   6 tablet   0   . chlorpheniramine-HYDROcodone (TUSSIONEX) 10-8 MG/5ML LQCR   Oral   Take 5 mLs by mouth every 12 (twelve) hours as needed.   140 mL   0   . levofloxacin (LEVAQUIN) 500 MG tablet   Oral   Take 1 tablet (500 mg total) by mouth daily.   10 tablet   0     BP 148/102  Pulse  89  Temp(Src) 98.6 F (37 C) (Oral)  Resp 16  SpO2 99%  Physical Exam  Nursing note and vitals reviewed. Constitutional: He is oriented to person, place, and time. He appears well-developed and well-nourished. No distress.  Abdominal: Soft. Bowel sounds are normal. He exhibits no distension and no mass. There is no hepatosplenomegaly. There is tenderness. There is no rigidity, no rebound, no guarding and no CVA tenderness.    Neurological: He is alert and oriented to person, place, and time.  Skin: Skin is warm and dry.    ED Course  Procedures (including critical care time)  Labs Reviewed - No data to display No results found.   1. Spastic colon       MDM          Linna Hoff, MD 04/26/12 2000

## 2012-05-18 ENCOUNTER — Telehealth: Payer: Self-pay | Admitting: Internal Medicine

## 2012-05-18 NOTE — Telephone Encounter (Signed)
Per drjenkins-coming from prednisone injection- take 1 extra amaryl for 2 days-pt informed

## 2012-05-18 NOTE — Telephone Encounter (Signed)
Patient Information:  Caller Name: Rickey Anderson  Phone: [phone]  Patient: Rickey Anderson, Rickey Anderson  Gender: Male  DOB: 04-21-1955  Age: 57 Years  PCP: Darryll Capers (Adults only)  Office Follow Up:  Does the office need to follow up with this patient?: Yes  Instructions For The Office: PLS READ RN NOTE  RN Note:  Pt had Cortizone shots in Knees on 3-4.  FSBS was 304 at 8am.  Current FSBS is 247.  Pt is asymptomatic at time of call.  Pt has taken Amaryl and Metformin prior to call.  Pt due to take Victoza 1.2 units at 1800 before dinner. Advised Pt to increase water intake, Pt will also exercise.  PLEASE REIVEW W/ MD IF VICTOZA SHOULD BE TAKEN NOW W/ FINGER STICK OF 247 AND NO SYMPTOMS.  Symptoms  Reason For Call & Symptoms: Elevated Glucose  Reviewed Health History In EMR: N/A  Reviewed Medications In EMR: N/A  Reviewed Allergies In EMR: N/A  Reviewed Surgeries / Procedures: N/A  Date of Onset of Symptoms: 05/18/2012  Guideline(s) Used:  Diabetes - High Blood Sugar  Disposition Per Guideline:   Home Care  Reason For Disposition Reached:   Blood glucose > 240 mg/dl (13 mmol/l)  Advice Given:  Treatment - Liquids  Drink at least one glass (8 oz or 240 ml) of water per hour for the next 4 hours. (Reason: adequate hydration will reduce hyperglycemia).  Generally, you should try to drink 6-8 glasses of water each day.

## 2012-07-04 ENCOUNTER — Telehealth: Payer: Self-pay | Admitting: Internal Medicine

## 2012-07-04 ENCOUNTER — Other Ambulatory Visit: Payer: Self-pay | Admitting: Internal Medicine

## 2012-07-04 MED ORDER — LIRAGLUTIDE 18 MG/3ML ~~LOC~~ SOLN: 1.2000 mg | Freq: Every day | SUBCUTANEOUS | Status: DC

## 2012-07-04 NOTE — Telephone Encounter (Signed)
I sent in as he requested,but not sure pharmacy can give that amount

## 2012-07-04 NOTE — Telephone Encounter (Signed)
PT called and stated that he is out of his Liraglutide (VICTOZA) 18 MG/3ML SOLN, he would like a 6 month refill if at all possible. He states that he travels over the road and a 6 month supply would greatly help him.

## 2012-07-05 ENCOUNTER — Other Ambulatory Visit: Payer: Self-pay | Admitting: *Deleted

## 2012-07-12 ENCOUNTER — Other Ambulatory Visit: Payer: Self-pay | Admitting: Orthopedic Surgery

## 2012-07-12 ENCOUNTER — Encounter (HOSPITAL_COMMUNITY): Payer: Self-pay | Admitting: Pharmacy Technician

## 2012-07-12 MED ORDER — DEXAMETHASONE SODIUM PHOSPHATE 10 MG/ML IJ SOLN: 10.0000 mg | Freq: Once | INTRAMUSCULAR | Status: DC

## 2012-07-12 NOTE — Progress Notes (Signed)
NEED PRE OP ORDERS PLEASE-  THANKS DREW

## 2012-07-12 NOTE — Progress Notes (Signed)
Preoperative surgical orders have been place into the Epic hospital system for Rickey Anderson on 07/12/2012, 5:32 PM  by Patrica Duel for surgery on 07/27/12.  Preop Bilateral Total Knee orders including Epidural per Anesthesia, IV Tylenol, and IV Decadron as long as there are no contraindications to the above medications. Avel Peace, PA-C

## 2012-07-15 ENCOUNTER — Other Ambulatory Visit (HOSPITAL_COMMUNITY): Payer: Self-pay | Admitting: Orthopedic Surgery

## 2012-07-15 NOTE — Patient Instructions (Addendum)
20 Rickey Anderson  07/15/2012   Your procedure is scheduled on: 07-27-2012  Report to [facility] at 1000 AM.  Call this number if you have problems the morning of surgery [phone] [name]  07/18/2012    Call this number if you have problems the morning of surgery: [phone]  Or Presurgical Testing [phone](Kaylan Yates)      Do not eat food:After Midnight.  May have clear liquids:up to 6 Hours before arrival. Nothing after : 0700 am.  Clear liquids include soda, tea, black coffee, apple or grape juice, broth.  Take these medicines the morning of surgery with A SIP OF WATER: Omeprazole.  Take 1/2 dose of Victoza night before surgery. Take no insulin or Diabetic meds AM of surgery.   Do not wear jewelry, make-up or nail polish.  Do not wear lotions, powders, or perfumes. You may wear deodorant.  Do not shave 12 hours prior to first CHG shower(legs and under arms).(face and neck okay.)  Do not bring valuables to the hospital.  Contacts, dentures or bridgework,body piercing,  may not be worn into surgery.  Leave suitcase in the car. After surgery it may be brought to your room.  For patients admitted to the hospital, checkout time is 11:00 AM the day of discharge.   Patients discharged the day of surgery will not be allowed to drive home. Must have responsible person with you x 24 hours once discharged.  Name and phone number of your driver: Neal [email or web address] [phone] cell  Special Instructions: CHG(Chlorhedine 4%-"Hibiclens","Betasept","Aplicare") Shower Use Special Wash: see special instructions.(avoid face and genitals)   Please read over the following fact sheets that you were given: MRSA Information, Blood Transfusion fact sheet, Incentive Spirometry Instruction.    Failure to follow these instructions may result in Cancellation of your surgery.   Patient signature_______________________________________________________

## 2012-07-18 ENCOUNTER — Encounter (HOSPITAL_COMMUNITY)
Admission: RE | Admit: 2012-07-18 | Discharge: 2012-07-18 | Disposition: A | Discharge status: Home / Self Care | Payer: PRIVATE HEALTH INSURANCE | Source: Ambulatory Visit | Attending: Orthopedic Surgery | Admitting: Orthopedic Surgery

## 2012-07-18 ENCOUNTER — Encounter (HOSPITAL_COMMUNITY): Payer: Self-pay

## 2012-07-18 DIAGNOSIS — Z01812 Encounter for preprocedural laboratory examination: Secondary | ICD-10-CM | POA: Insufficient documentation

## 2012-07-18 DIAGNOSIS — M1710 Unilateral primary osteoarthritis, unspecified knee: Secondary | ICD-10-CM | POA: Insufficient documentation

## 2012-07-18 LAB — COMPREHENSIVE METABOLIC PANEL
ALT: 47 U/L (ref 0–53)
BUN: 16 mg/dL (ref 6–23)
CO2: 26 mEq/L (ref 19–32)
Calcium: 10 mg/dL (ref 8.4–10.5)
Creatinine, Ser: 0.76 mg/dL (ref 0.50–1.35)
GFR calc Af Amer: >90 mL/min (ref >90)
GFR calc non Af Amer: >90 mL/min (ref >90)
Glucose, Bld: 98 mg/dL (ref 70–99)
Sodium: 136 mEq/L (ref 135–145)
Total Protein: 7.6 g/dL (ref 6.0–8.3)

## 2012-07-18 LAB — URINALYSIS, ROUTINE W REFLEX MICROSCOPIC
Hgb urine dipstick: NEGATIVE
Leukocytes, UA: NEGATIVE
Nitrite: NEGATIVE
Specific Gravity, Urine: 1.031 — ABNORMAL HIGH (ref 1.005–1.030)
Urobilinogen, UA: 0.2 mg/dL (ref 0.0–1.0)

## 2012-07-18 LAB — CBC
HCT: 42.8 % (ref 39.0–52.0)
Hemoglobin: 14.4 g/dL (ref 13.0–17.0)
MCH: 29.9 pg (ref 26.0–34.0)
MCHC: 33.6 g/dL (ref 30.0–36.0)
MCV: 89 fL (ref 78.0–100.0)
RBC: 4.81 MIL/uL (ref 4.22–5.81)

## 2012-07-18 LAB — PROTIME-INR: Prothrombin Time: 11.9 seconds (ref 11.6–15.2)

## 2012-07-19 NOTE — Pre-Procedure Instructions (Signed)
07-19-12 1420 Pt. Notified of Positive PCR screen for Staph aureus-will use Mupirocin as directed.

## 2012-07-24 ENCOUNTER — Other Ambulatory Visit: Payer: Self-pay | Admitting: Surgical

## 2012-07-24 NOTE — H&P (Signed)
TOTAL KNEE ADMISSION H&P  Patient is being admitted for bilaterally total knee arthroplasty.  Subjective:  Chief Complaint:bilaterally knee pain.  HPI: Rickey Anderson, 57 y.o. male, has a history of pain and functional disability in the bilaterally knee due to arthritis and has failed non-surgical conservative treatments for greater than 12 weeks to includeNSAID's and/or analgesics, corticosteriod injections, flexibility and strengthening excercises and activity modification.  Onset of symptoms was gradual, starting 3 years ago with gradually worsening course since that time. The patient noted prior procedures on the knee to include  arthroscopy and menisectomy on the bilaterally knee(s).  Patient currently rates pain in the bilaterally knee(s) at 6 out of 10 with activity. Patient has night pain, worsening of pain with activity and weight bearing, pain that interferes with activities of daily living, pain with passive range of motion and joint swelling.  Patient has evidence of subchondral cysts, periarticular osteophytes and joint space narrowing by imaging studies.  There is no active infection.  Patient Active Problem List   Diagnosis Date Noted  . Chronic sinusitis 11/04/2011  . Nonspecific elevation of level of transaminase or lactic acid dehydrogenase (LDH) 11/04/2011  . HEMATURIA UNSPECIFIED 03/04/2009  . HYPERLIPIDEMIA 08/22/2007  . UNSPECIFIED PROSTATITIS 08/22/2007  . DIABETES MELLITUS, TYPE II 09/24/2006  . GERD 09/24/2006   Past Medical History  Diagnosis Date  . GERD (gastroesophageal reflux disease)   . Diabetes mellitus   . Hyperlipidemia   . Herpes   . Norovirus     1'14- no reoccurring issues    Past Surgical History  Procedure Laterality Date  . Knee surgery      both knees-scopes  . Tonsillectomy    . Functional endoscopic sinus surgery      Deviated septum repair 10'13  . Vasectomy    . Eardrum      hx. perforated ear drum surgery right     Current  outpatient prescriptions:glimepiride (AMARYL) 2 MG tablet, Take 1 tablet (2 mg total) by mouth 2 (two) times daily., Disp: 180 tablet, Rfl: 3;  Liraglutide (VICTOZA) 18 MG/3ML SOLN injection, Inject 0.2 mLs (1.2 mg total) into the skin daily., Disp: 36 mL, Rfl: 3;  metFORMIN (GLUCOPHAGE) 1000 MG tablet, Take 1 tablet (1,000 mg total) by mouth 2 (two) times daily with a meal., Disp: 60 tablet, Rfl: 11 Multiple Vitamin (MULTIVITAMIN WITH MINERALS) TABS, Take 1 tablet by mouth daily., Disp: , Rfl: ;  omeprazole (PRILOSEC) 20 MG capsule, Take 20 mg by mouth daily.  , Disp: , Rfl: ;  polycarbophil (FIBERCON) 625 MG tablet, Take 625 mg by mouth daily., Disp: , Rfl:   No Known Allergies  History  Substance Use Topics  . Smoking status: Former Smoker    Quit date: 07/18/1997  . Smokeless tobacco: Not on file  . Alcohol Use: No    Family History  Problem Relation Age of Onset  . Hypertension    . Heart disease    . Alcohol abuse    . Depression       Review of Systems  Constitutional: Negative.   HENT: Negative.  Negative for neck pain.   Eyes: Negative.   Respiratory: Negative.   Cardiovascular: Negative.   Gastrointestinal: Negative.   Genitourinary: Negative.   Musculoskeletal: Positive for joint pain. Negative for myalgias, back pain and falls.       Bilateral knee pain  Skin: Negative.   Neurological: Negative.   Endo/Heme/Allergies: Negative.   Psychiatric/Behavioral: Negative.     Objective:  Physical Exam  Constitutional: He is oriented to person, place, and time. He appears well-developed and well-nourished. No distress.  HENT:  Head: Normocephalic and atraumatic.  Right Ear: External ear normal.  Left Ear: External ear normal.  Nose: Nose normal.  Mouth/Throat: Oropharynx is clear and moist.  Eyes: Conjunctivae and EOM are normal.  Neck: Normal range of motion. Neck supple. No tracheal deviation present. No thyromegaly present.  Cardiovascular: Normal rate, regular  rhythm, normal heart sounds and intact distal pulses.   No murmur heard. Respiratory: Effort normal and breath sounds normal. No respiratory distress. He has no wheezes. He exhibits no tenderness.  GI: Soft. Bowel sounds are normal. He exhibits no distension and no mass. There is no tenderness.  Musculoskeletal:       Right hip: Normal.       Left hip: Normal.       Right knee: He exhibits decreased range of motion and swelling. He exhibits no effusion and no erythema. Tenderness found. Medial joint line and lateral joint line tenderness noted.       Left knee: He exhibits decreased range of motion and swelling. He exhibits no effusion and no erythema. Tenderness found. Medial joint line and lateral joint line tenderness noted.       Right lower leg: He exhibits no tenderness and no swelling.       Left lower leg: He exhibits no tenderness and no swelling.  His left knee shows no effusion. He has a varus deformity. His range is 10-120. He has a lot of lateral tenderness and a palpable joint line mass consistent with a meniscal cyst. There is some AP laxity. There is no varus or valgus laxity. The right knee range is 5-125. Slight varus. He is tender medial greater than lateral with no instability noted.  Lymphadenopathy:    He has no cervical adenopathy.  Neurological: He is alert and oriented to person, place, and time. He has normal strength. No sensory deficit.  Skin: No rash noted. He is not diaphoretic. No erythema.  Psychiatric: He has a normal mood and affect. His behavior is normal.    Vitals Pulse: 76 (Regular) BP: 126/78 (Sitting, Left Arm, Standard)  Estimated body mass index is 36.01 kg/(m^2) as calculated from the following:   Height as of 01/11/12: 5\' 7"  (1.702 m).   Weight as of 01/11/12: 104.327 kg (230 lb).   Imaging Review Plain radiographs demonstrate severe degenerative joint disease of the bilaterally knee(s). The overall alignment ismild varus. The bone  quality appears to be good for age and reported activity level.  Assessment/Plan:  End stage arthritis, bilaterally knee   The patient history, physical examination, clinical judgment of the provider and imaging studies are consistent with end stage degenerative joint disease of the bilaterally knee(s) and total knee arthroplasty is deemed medically necessary. The treatment options including medical management, injection therapy arthroscopy and arthroplasty were discussed at length. The risks and benefits of total knee arthroplasty were presented and reviewed. The risks due to aseptic loosening, infection, stiffness, patella tracking problems, thromboembolic complications and other imponderables were discussed. The patient acknowledged the explanation, agreed to proceed with the plan and consent was signed. Patient is being admitted for inpatient treatment for surgery, pain control, PT, OT, prophylactic antibiotics, VTE prophylaxis, progressive ambulation and ADL's and discharge planning. The patient is planning to be discharged to skilled nursing facility    [place], [place]

## 2012-07-27 ENCOUNTER — Encounter (HOSPITAL_COMMUNITY)
Admission: RE | Disposition: A | Discharge status: Rehabilitation Facility | Payer: Self-pay | Source: Ambulatory Visit | Attending: Orthopedic Surgery

## 2012-07-27 ENCOUNTER — Ambulatory Visit (HOSPITAL_COMMUNITY): Payer: PRIVATE HEALTH INSURANCE | Admitting: Anesthesiology

## 2012-07-27 ENCOUNTER — Inpatient Hospital Stay (HOSPITAL_COMMUNITY)
Admission: RE | Admit: 2012-07-27 | Discharge: 2012-08-01 | DRG: 462 | Disposition: A | Discharge status: Rehabilitation Facility | Payer: PRIVATE HEALTH INSURANCE | Source: Ambulatory Visit | Attending: Orthopedic Surgery | Admitting: Orthopedic Surgery

## 2012-07-27 ENCOUNTER — Encounter (HOSPITAL_COMMUNITY): Payer: Self-pay | Admitting: Anesthesiology

## 2012-07-27 ENCOUNTER — Encounter (HOSPITAL_COMMUNITY): Payer: Self-pay | Admitting: *Deleted

## 2012-07-27 DIAGNOSIS — Z79899 Other long term (current) drug therapy: Secondary | ICD-10-CM

## 2012-07-27 DIAGNOSIS — E785 Hyperlipidemia, unspecified: Secondary | ICD-10-CM | POA: Diagnosis present

## 2012-07-27 DIAGNOSIS — J329 Chronic sinusitis, unspecified: Secondary | ICD-10-CM | POA: Diagnosis present

## 2012-07-27 DIAGNOSIS — D62 Acute posthemorrhagic anemia: Secondary | ICD-10-CM | POA: Diagnosis not present

## 2012-07-27 DIAGNOSIS — Z87891 Personal history of nicotine dependence: Secondary | ICD-10-CM

## 2012-07-27 DIAGNOSIS — Z96653 Presence of artificial knee joint, bilateral: Secondary | ICD-10-CM

## 2012-07-27 DIAGNOSIS — E119 Type 2 diabetes mellitus without complications: Secondary | ICD-10-CM | POA: Diagnosis present

## 2012-07-27 DIAGNOSIS — M1710 Unilateral primary osteoarthritis, unspecified knee: Principal | ICD-10-CM | POA: Diagnosis present

## 2012-07-27 DIAGNOSIS — M179 Osteoarthritis of knee, unspecified: Secondary | ICD-10-CM | POA: Diagnosis present

## 2012-07-27 DIAGNOSIS — K219 Gastro-esophageal reflux disease without esophagitis: Secondary | ICD-10-CM | POA: Diagnosis present

## 2012-07-27 HISTORY — PX: TOTAL KNEE ARTHROPLASTY: SHX125

## 2012-07-27 LAB — TYPE AND SCREEN
ABO/RH(D): A POS
Antibody Screen: NEGATIVE

## 2012-07-27 LAB — GLUCOSE, CAPILLARY

## 2012-07-27 SURGERY — ARTHROPLASTY, KNEE, BILATERAL, TOTAL
Anesthesia: General | Site: Knee | Laterality: Bilateral | Wound class: Clean

## 2012-07-27 MED ORDER — CEFAZOLIN SODIUM-DEXTROSE 2-3 GM-% IV SOLR
INTRAVENOUS | Status: AC
  Filled 2012-07-27: qty 50

## 2012-07-27 MED ORDER — ACETAMINOPHEN 10 MG/ML IV SOLN
INTRAVENOUS | Status: AC
  Filled 2012-07-27: qty 100

## 2012-07-27 MED ORDER — GLYCOPYRROLATE 0.2 MG/ML IJ SOLN
INTRAMUSCULAR | Status: DC | PRN
  Administered 2012-07-27: 0.4 mg via INTRAVENOUS

## 2012-07-27 MED ORDER — SODIUM CHLORIDE 0.9 % IR SOLN
Status: DC | PRN
  Administered 2012-07-27: 3000 mL

## 2012-07-27 MED ORDER — NEOSTIGMINE METHYLSULFATE 1 MG/ML IJ SOLN
INTRAMUSCULAR | Status: DC | PRN
  Administered 2012-07-27: 3 mg via INTRAVENOUS

## 2012-07-27 MED ORDER — LIRAGLUTIDE 18 MG/3ML ~~LOC~~ SOPN
1.2000 mg | PEN_INJECTOR | Freq: Every day | SUBCUTANEOUS | Status: DC
Start: 2012-07-28 — End: 2012-08-01
  Administered 2012-07-29 – 2012-07-31 (×3): 1.2 mg via SUBCUTANEOUS

## 2012-07-27 MED ORDER — METHOCARBAMOL 100 MG/ML IJ SOLN
500.0000 mg | Freq: Four times a day (QID) | INTRAVENOUS | Status: DC | PRN
  Administered 2012-07-27 – 2012-07-29 (×2): 500 mg via INTRAVENOUS
  Filled 2012-07-27 (×2): qty 5

## 2012-07-27 MED ORDER — HYDROMORPHONE HCL PF 1 MG/ML IJ SOLN
0.2500 mg | INTRAMUSCULAR | Status: DC | PRN
  Administered 2012-07-27 (×2): 0.25 mg via INTRAVENOUS

## 2012-07-27 MED ORDER — TRAMADOL HCL 50 MG PO TABS: 50.0000 mg | ORAL_TABLET | Freq: Four times a day (QID) | ORAL | Status: DC | PRN

## 2012-07-27 MED ORDER — GLIMEPIRIDE 2 MG PO TABS
2.0000 mg | ORAL_TABLET | Freq: Two times a day (BID) | ORAL | Status: DC
  Administered 2012-07-28 – 2012-08-01 (×9): 2 mg via ORAL
  Filled 2012-07-27 (×12): qty 1

## 2012-07-27 MED ORDER — ONDANSETRON HCL 4 MG PO TABS
4.0000 mg | ORAL_TABLET | Freq: Four times a day (QID) | ORAL | Status: DC | PRN
  Administered 2012-07-28: 4 mg via ORAL
  Filled 2012-07-27: qty 1

## 2012-07-27 MED ORDER — SODIUM CHLORIDE 0.9 % IV SOLN
INTRAVENOUS | Status: DC
  Administered 2012-07-27 – 2012-07-29 (×4): via INTRAVENOUS

## 2012-07-27 MED ORDER — INSULIN ASPART 100 UNIT/ML ~~LOC~~ SOLN
0.0000 [IU] | Freq: Three times a day (TID) | SUBCUTANEOUS | Status: DC
  Administered 2012-07-28: 3 [IU] via SUBCUTANEOUS
  Administered 2012-07-28: 2 [IU] via SUBCUTANEOUS
  Administered 2012-07-28 – 2012-07-29 (×2): 3 [IU] via SUBCUTANEOUS
  Administered 2012-07-29: 5 [IU] via SUBCUTANEOUS
  Administered 2012-07-30 (×2): 3 [IU] via SUBCUTANEOUS
  Administered 2012-07-30 – 2012-07-31 (×3): 2 [IU] via SUBCUTANEOUS
  Administered 2012-08-01: 3 [IU] via SUBCUTANEOUS

## 2012-07-27 MED ORDER — PHENOL 1.4 % MT LIQD: 1.0000 | OROMUCOSAL | Status: DC | PRN

## 2012-07-27 MED ORDER — ACETAMINOPHEN 10 MG/ML IV SOLN
INTRAVENOUS | Status: DC | PRN
  Administered 2012-07-27: 1000 mg via INTRAVENOUS

## 2012-07-27 MED ORDER — HYDROMORPHONE HCL PF 1 MG/ML IJ SOLN
INTRAMUSCULAR | Status: DC | PRN
  Administered 2012-07-27 (×2): 1 mg via INTRAVENOUS

## 2012-07-27 MED ORDER — CEFAZOLIN SODIUM 1-5 GM-% IV SOLN
1.0000 g | Freq: Four times a day (QID) | INTRAVENOUS | Status: AC
  Administered 2012-07-27 – 2012-07-28 (×2): 1 g via INTRAVENOUS
  Filled 2012-07-27 (×2): qty 50

## 2012-07-27 MED ORDER — BISACODYL 10 MG RE SUPP: 10.0000 mg | Freq: Every day | RECTAL | Status: DC | PRN

## 2012-07-27 MED ORDER — METHOCARBAMOL 500 MG PO TABS
500.0000 mg | ORAL_TABLET | Freq: Four times a day (QID) | ORAL | Status: DC | PRN
  Administered 2012-07-28 – 2012-08-01 (×13): 500 mg via ORAL
  Filled 2012-07-27 (×13): qty 1

## 2012-07-27 MED ORDER — DOCUSATE SODIUM 100 MG PO CAPS
100.0000 mg | ORAL_CAPSULE | Freq: Two times a day (BID) | ORAL | Status: DC
  Administered 2012-07-27 – 2012-08-01 (×10): 100 mg via ORAL

## 2012-07-27 MED ORDER — ACETAMINOPHEN 650 MG RE SUPP: 650.0000 mg | Freq: Four times a day (QID) | RECTAL | Status: DC | PRN

## 2012-07-27 MED ORDER — HYDROMORPHONE HCL PF 1 MG/ML IJ SOLN
INTRAMUSCULAR | Status: AC
  Filled 2012-07-27: qty 1

## 2012-07-27 MED ORDER — OXYCODONE HCL 5 MG PO TABS
5.0000 mg | ORAL_TABLET | ORAL | Status: DC | PRN
  Administered 2012-07-28: 10 mg via ORAL
  Administered 2012-07-28 (×3): 20 mg via ORAL
  Filled 2012-07-27 (×3): qty 4
  Filled 2012-07-27: qty 2

## 2012-07-27 MED ORDER — EPHEDRINE SULFATE 50 MG/ML IJ SOLN
INTRAMUSCULAR | Status: DC | PRN
  Administered 2012-07-27: 10 mg via INTRAVENOUS
  Administered 2012-07-27: 5 mg via INTRAVENOUS
  Administered 2012-07-27: 10 mg via INTRAVENOUS

## 2012-07-27 MED ORDER — TRANEXAMIC ACID 100 MG/ML IV SOLN
1000.0000 mg | INTRAVENOUS | Status: AC
  Administered 2012-07-27: 1000 mg via INTRAVENOUS
  Filled 2012-07-27: qty 10

## 2012-07-27 MED ORDER — PROMETHAZINE HCL 25 MG/ML IJ SOLN
12.5000 mg | INTRAMUSCULAR | Status: DC | PRN
  Administered 2012-07-27: 6.25 mg via INTRAVENOUS

## 2012-07-27 MED ORDER — BUPIVACAINE HCL (PF) 0.25 % IJ SOLN
INTRAMUSCULAR | Status: AC
  Filled 2012-07-27: qty 30

## 2012-07-27 MED ORDER — LIDOCAINE HCL (CARDIAC) 20 MG/ML IV SOLN
INTRAVENOUS | Status: DC | PRN
  Administered 2012-07-27: 30 mg via INTRAVENOUS

## 2012-07-27 MED ORDER — LIRAGLUTIDE 18 MG/3ML ~~LOC~~ SOLN: 1.2000 mg | Freq: Every day | SUBCUTANEOUS | Status: DC

## 2012-07-27 MED ORDER — SUCCINYLCHOLINE CHLORIDE 20 MG/ML IJ SOLN
INTRAMUSCULAR | Status: DC | PRN
  Administered 2012-07-27 (×2): 150 mg via INTRAVENOUS

## 2012-07-27 MED ORDER — METOCLOPRAMIDE HCL 10 MG PO TABS: 5.0000 mg | ORAL_TABLET | Freq: Three times a day (TID) | ORAL | Status: DC | PRN

## 2012-07-27 MED ORDER — PHENYLEPHRINE HCL 10 MG/ML IJ SOLN
INTRAMUSCULAR | Status: DC | PRN
  Administered 2012-07-27 (×2): 40 ug via INTRAVENOUS

## 2012-07-27 MED ORDER — MORPHINE SULFATE 2 MG/ML IJ SOLN
1.0000 mg | INTRAMUSCULAR | Status: DC | PRN
  Administered 2012-07-28 (×3): 2 mg via INTRAVENOUS
  Administered 2012-07-28: 1 mg via INTRAVENOUS
  Administered 2012-07-28 – 2012-07-31 (×25): 2 mg via INTRAVENOUS
  Filled 2012-07-27 (×29): qty 1

## 2012-07-27 MED ORDER — ACETAMINOPHEN 325 MG PO TABS
650.0000 mg | ORAL_TABLET | Freq: Four times a day (QID) | ORAL | Status: DC | PRN
  Administered 2012-07-28 – 2012-07-30 (×2): 650 mg via ORAL
  Filled 2012-07-27 (×2): qty 2

## 2012-07-27 MED ORDER — PROPOFOL 10 MG/ML IV BOLUS
INTRAVENOUS | Status: DC | PRN
  Administered 2012-07-27: 200 mg via INTRAVENOUS
  Administered 2012-07-27: 50 mg via INTRAVENOUS

## 2012-07-27 MED ORDER — LACTATED RINGERS IV SOLN
INTRAVENOUS | Status: DC | PRN
  Administered 2012-07-27 (×3): via INTRAVENOUS

## 2012-07-27 MED ORDER — SODIUM CHLORIDE 0.9 % IV SOLN: INTRAVENOUS | Status: DC

## 2012-07-27 MED ORDER — MIDAZOLAM HCL 2 MG/2ML IJ SOLN
1.0000 mg | INTRAMUSCULAR | Status: DC | PRN
  Administered 2012-07-27: 2 mg via INTRAVENOUS

## 2012-07-27 MED ORDER — ONDANSETRON HCL 4 MG/2ML IJ SOLN
4.0000 mg | Freq: Four times a day (QID) | INTRAMUSCULAR | Status: DC | PRN
  Administered 2012-07-29 – 2012-07-31 (×4): 4 mg via INTRAVENOUS
  Filled 2012-07-27 (×4): qty 2

## 2012-07-27 MED ORDER — MIDAZOLAM HCL 2 MG/2ML IJ SOLN
INTRAMUSCULAR | Status: AC
  Filled 2012-07-27: qty 2

## 2012-07-27 MED ORDER — BUPIVACAINE HCL (PF) 0.25 % IJ SOLN
INTRAMUSCULAR | Status: DC | PRN
  Administered 2012-07-27: 15 mL via EPIDURAL

## 2012-07-27 MED ORDER — LACTATED RINGERS IV SOLN: INTRAVENOUS | Status: DC

## 2012-07-27 MED ORDER — PROMETHAZINE HCL 25 MG/ML IJ SOLN
INTRAMUSCULAR | Status: AC
  Filled 2012-07-27: qty 1

## 2012-07-27 MED ORDER — CHLORHEXIDINE GLUCONATE 4 % EX LIQD: 60.0000 mL | Freq: Once | CUTANEOUS | Status: DC

## 2012-07-27 MED ORDER — ONDANSETRON HCL 4 MG/2ML IJ SOLN
INTRAMUSCULAR | Status: DC | PRN
  Administered 2012-07-27 (×2): 2 mg via INTRAVENOUS

## 2012-07-27 MED ORDER — DIPHENHYDRAMINE HCL 12.5 MG/5ML PO ELIX
12.5000 mg | ORAL_SOLUTION | ORAL | Status: DC | PRN
  Administered 2012-07-27 – 2012-07-31 (×8): 25 mg via ORAL
  Filled 2012-07-27: qty 10
  Filled 2012-07-27: qty 5
  Filled 2012-07-27 (×5): qty 10
  Filled 2012-07-27: qty 5
  Filled 2012-07-27: qty 10

## 2012-07-27 MED ORDER — METFORMIN HCL 500 MG PO TABS
1000.0000 mg | ORAL_TABLET | Freq: Two times a day (BID) | ORAL | Status: DC
  Administered 2012-07-28 – 2012-08-01 (×9): 1000 mg via ORAL
  Filled 2012-07-27 (×12): qty 2

## 2012-07-27 MED ORDER — FLEET ENEMA 7-19 GM/118ML RE ENEM: 1.0000 | ENEMA | Freq: Once | RECTAL | Status: AC | PRN

## 2012-07-27 MED ORDER — FENTANYL CITRATE 0.05 MG/ML IJ SOLN
INTRAMUSCULAR | Status: DC | PRN
  Administered 2012-07-27 (×6): 50 ug via INTRAVENOUS
  Administered 2012-07-27: 100 ug via INTRAVENOUS
  Administered 2012-07-27: 50 ug via INTRAVENOUS

## 2012-07-27 MED ORDER — ACETAMINOPHEN 10 MG/ML IV SOLN: 1000.0000 mg | Freq: Once | INTRAVENOUS | Status: DC

## 2012-07-27 MED ORDER — SODIUM CHLORIDE 0.9 % IV SOLN
INTRAVENOUS | Status: DC
  Administered 2012-07-27 – 2012-07-28 (×3): via EPIDURAL
  Filled 2012-07-27 (×9): qty 20

## 2012-07-27 MED ORDER — MIDAZOLAM HCL 5 MG/5ML IJ SOLN
INTRAMUSCULAR | Status: DC | PRN
  Administered 2012-07-27 (×2): 1 mg via INTRAVENOUS

## 2012-07-27 MED ORDER — MENTHOL 3 MG MT LOZG: 1.0000 | LOZENGE | OROMUCOSAL | Status: DC | PRN

## 2012-07-27 MED ORDER — CISATRACURIUM BESYLATE (PF) 10 MG/5ML IV SOLN
INTRAVENOUS | Status: DC | PRN
  Administered 2012-07-27: 6 mg via INTRAVENOUS
  Administered 2012-07-27: 10 mg via INTRAVENOUS

## 2012-07-27 MED ORDER — POLYETHYLENE GLYCOL 3350 17 G PO PACK: 17.0000 g | PACK | Freq: Every day | ORAL | Status: DC | PRN

## 2012-07-27 MED ORDER — PANTOPRAZOLE SODIUM 40 MG PO TBEC
40.0000 mg | DELAYED_RELEASE_TABLET | Freq: Every day | ORAL | Status: DC
  Filled 2012-07-27: qty 1

## 2012-07-27 MED ORDER — METOCLOPRAMIDE HCL 5 MG/ML IJ SOLN
5.0000 mg | Freq: Three times a day (TID) | INTRAMUSCULAR | Status: DC | PRN
  Administered 2012-07-27 – 2012-07-28 (×2): 10 mg via INTRAVENOUS
  Filled 2012-07-27 (×2): qty 2

## 2012-07-27 MED ORDER — 0.9 % SODIUM CHLORIDE (POUR BTL) OPTIME
TOPICAL | Status: DC | PRN
  Administered 2012-07-27: 1000 mL

## 2012-07-27 MED ORDER — KETAMINE HCL 10 MG/ML IJ SOLN
INTRAMUSCULAR | Status: DC | PRN
  Administered 2012-07-27: 30 mg via INTRAVENOUS

## 2012-07-27 MED ORDER — CEFAZOLIN SODIUM-DEXTROSE 2-3 GM-% IV SOLR
2.0000 g | INTRAVENOUS | Status: AC
  Administered 2012-07-27: 2 g via INTRAVENOUS

## 2012-07-27 SURGICAL SUPPLY — 59 items
AUTOTRANSFUSION W/QD PVC DRAIN (AUTOTRANSFUSION) ×4 IMPLANT
BAG SPEC THK2 15X12 ZIP CLS (MISCELLANEOUS) ×2
BAG ZIPLOCK 12X15 (MISCELLANEOUS) ×4 IMPLANT
BANDAGE ELASTIC 6 VELCRO ST LF (GAUZE/BANDAGES/DRESSINGS) ×3 IMPLANT
BANDAGE ESMARK 6X9 LF (GAUZE/BANDAGES/DRESSINGS) ×2 IMPLANT
BLADE SAG 18X100X1.27 (BLADE) ×3 IMPLANT
BLADE SAW SGTL 11.0X1.19X90.0M (BLADE) ×3 IMPLANT
BLADE SURG SZ10 CARB STEEL (BLADE) ×4 IMPLANT
BNDG CMPR 9X6 STRL LF SNTH (GAUZE/BANDAGES/DRESSINGS) ×2
BNDG COHESIVE 6X5 TAN STRL LF (GAUZE/BANDAGES/DRESSINGS) ×2 IMPLANT
BNDG ESMARK 6X9 LF (GAUZE/BANDAGES/DRESSINGS) ×4
BOWL SMART MIX CTS (DISPOSABLE) ×4 IMPLANT
CEMENT HV SMART SET (Cement) ×6 IMPLANT
CLOTH BEACON ORANGE TIMEOUT ST (SAFETY) ×2 IMPLANT
CUFF TOURN SGL QUICK 34 (TOURNIQUET CUFF) ×4
CUFF TRNQT CYL 34X4X40X1 (TOURNIQUET CUFF) ×2 IMPLANT
DRAPE EXTREMITY BILATERAL (DRAPE) ×2 IMPLANT
DRAPE INCISE IOBAN 66X45 STRL (DRAPES) ×2 IMPLANT
DRAPE POUCH INSTRU U-SHP 10X18 (DRAPES) ×2 IMPLANT
DRAPE U-SHAPE 47X51 STRL (DRAPES) ×6 IMPLANT
DRSG ADAPTIC 3X8 NADH LF (GAUZE/BANDAGES/DRESSINGS) ×3 IMPLANT
DRSG PAD ABDOMINAL 8X10 ST (GAUZE/BANDAGES/DRESSINGS) ×3 IMPLANT
DURAPREP 26ML APPLICATOR (WOUND CARE) ×4 IMPLANT
ELECT REM PT RETURN 9FT ADLT (ELECTROSURGICAL) ×2
ELECTRODE REM PT RTRN 9FT ADLT (ELECTROSURGICAL) ×1 IMPLANT
EVACUATOR 1/8 PVC DRAIN (DRAIN) ×2 IMPLANT
FACESHIELD LNG OPTICON STERILE (SAFETY) ×16 IMPLANT
GLOVE BIO SURGEON STRL SZ7.5 (GLOVE) ×8 IMPLANT
GLOVE BIO SURGEON STRL SZ8 (GLOVE) ×4 IMPLANT
GLOVE BIOGEL PI IND STRL 8 (GLOVE) ×2 IMPLANT
GLOVE BIOGEL PI INDICATOR 8 (GLOVE) ×2
GOWN STRL NON-REIN LRG LVL3 (GOWN DISPOSABLE) ×3 IMPLANT
GOWN STRL REIN XL XLG (GOWN DISPOSABLE) ×3 IMPLANT
HANDPIECE INTERPULSE COAX TIP (DISPOSABLE) ×2
IMMOBILIZER KNEE 20 (SOFTGOODS) ×2
IMMOBILIZER KNEE 20 THIGH 36 (SOFTGOODS) ×2 IMPLANT
KIT BASIN OR (CUSTOM PROCEDURE TRAY) ×2 IMPLANT
MANIFOLD NEPTUNE II (INSTRUMENTS) ×2 IMPLANT
NDL SAFETY ECLIPSE 18X1.5 (NEEDLE) ×1 IMPLANT
NEEDLE HYPO 18GX1.5 SHARP (NEEDLE)
NS IRRIG 1000ML POUR BTL (IV SOLUTION) ×2 IMPLANT
PACK TOTAL JOINT (CUSTOM PROCEDURE TRAY) ×2 IMPLANT
PADDING CAST COTTON 6X4 STRL (CAST SUPPLIES) ×9 IMPLANT
SET HNDPC FAN SPRY TIP SCT (DISPOSABLE) ×1 IMPLANT
SPONGE GAUZE 4X4 12PLY (GAUZE/BANDAGES/DRESSINGS) ×4 IMPLANT
SPONGE LAP 18X18 X RAY DECT (DISPOSABLE) ×2 IMPLANT
STOCKINETTE 8 INCH (MISCELLANEOUS) ×2 IMPLANT
STRIP CLOSURE SKIN 1/2X4 (GAUZE/BANDAGES/DRESSINGS) ×6 IMPLANT
SUCTION FRAZIER 12FR DISP (SUCTIONS) ×2 IMPLANT
SUT MNCRL AB 4-0 PS2 18 (SUTURE) ×4 IMPLANT
SUT VIC AB 2-0 CT1 27 (SUTURE) ×12
SUT VIC AB 2-0 CT1 TAPERPNT 27 (SUTURE) ×6 IMPLANT
SUT VLOC 180 0 24IN GS25 (SUTURE) ×4 IMPLANT
SYR 20CC LL (SYRINGE) IMPLANT
SYR 50ML LL SCALE MARK (SYRINGE) ×2 IMPLANT
TOWEL OR 17X26 10 PK STRL BLUE (TOWEL DISPOSABLE) ×4 IMPLANT
TRAY FOLEY CATH 14FRSI W/METER (CATHETERS) ×2 IMPLANT
WATER STERILE IRR 1500ML POUR (IV SOLUTION) ×3 IMPLANT
WRAP KNEE MAXI GEL POST OP (GAUZE/BANDAGES/DRESSINGS) ×6 IMPLANT

## 2012-07-27 NOTE — Anesthesia Preprocedure Evaluation (Signed)
Anesthesia Evaluation  Patient identified by MRN, date of birth, ID band Patient awake    Reviewed: Allergy & Precautions, H&P , NPO status , Patient's Chart, lab work & pertinent test results  Airway Mallampati: II TM Distance: >3 FB Neck ROM: full    Dental no notable dental hx.    Pulmonary neg pulmonary ROS,  breath sounds clear to auscultation  Pulmonary exam normal       Cardiovascular Exercise Tolerance: Good negative cardio ROS  Rhythm:regular Rate:Normal     Neuro/Psych negative neurological ROS  negative psych ROS   GI/Hepatic negative GI ROS, Neg liver ROS, GERD-  Medicated and Controlled,  Endo/Other  diabetes, Well Controlled, Type 2, Oral Hypoglycemic Agents  Renal/GU negative Renal ROS  negative genitourinary   Musculoskeletal   Abdominal   Peds  Hematology negative hematology ROS (+)   Anesthesia Other Findings   Reproductive/Obstetrics negative OB ROS                           Anesthesia Physical Anesthesia Plan  ASA: II  Anesthesia Plan: General   Post-op Pain Management:    Induction: Intravenous  Airway Management Planned: Oral ETT  Additional Equipment:   Intra-op Plan:   Post-operative Plan: Extubation in OR  Informed Consent: I have reviewed the patients History and Physical, chart, labs and discussed the procedure including the risks, benefits and alternatives for the proposed anesthesia with the patient or authorized representative who has indicated his/her understanding and acceptance.   Dental Advisory Given  Plan Discussed with: CRNA and Surgeon  Anesthesia Plan Comments:         Anesthesia Quick Evaluation

## 2012-07-27 NOTE — Interval H&P Note (Signed)
History and Physical Interval Note:  07/27/2012 1:30 PM  Rickey Anderson  has presented today for surgery, with the diagnosis of OA BILATERAL KNEES  The various methods of treatment have been discussed with the patient and family. After consideration of risks, benefits and other options for treatment, the patient has consented to  Procedure(s): TOTAL KNEE BILATERAL (Bilateral) as a surgical intervention .  The patient's history has been reviewed, patient examined, no change in status, stable for surgery.  I have reviewed the patient's chart and labs.  Questions were answered to the patient's satisfaction.     Loanne Drilling

## 2012-07-27 NOTE — Plan of Care (Signed)
Problem: Consults Goal: Diagnosis- Total Joint Replacement Primary Total Knee     

## 2012-07-27 NOTE — Transfer of Care (Signed)
Immediate Anesthesia Transfer of Care Note  Patient: Rickey Anderson  Procedure(s) Performed: Procedure(s): TOTAL KNEE BILATERAL (Bilateral)  Patient Location: PACU  Anesthesia Type:General and Regional  Level of Consciousness: awake, alert , oriented and patient cooperative  Airway & Oxygen Therapy: Patient Spontanous Breathing and Patient connected to face mask oxygen  Post-op Assessment: Report given to PACU RN, Post -op Vital signs reviewed and stable and Patient moving all extremities X 4  Post vital signs: stable  Complications: No apparent anesthesia complications

## 2012-07-27 NOTE — Op Note (Signed)
Pre-operative diagnosis- Osteoarthritis  Bilateral knee(s)  Post-operative diagnosis- Osteoarthritis Bilateral knee(s)  Procedure-  Bilateral  Total Knee Arthroplasty  Surgeon- Gus Rankin. Malcolm Quast, MD  Assistant- Avel Peace, PA-C   Anesthesia-  General and Epidural EBL-* No blood loss amount entered *  Drains Autovac each side  Tourniquet time-  Total Tourniquet Time Documented: Thigh (Right) - 40 minutes Total: Thigh (Right) - 40 minutes  Thigh (Left) - 35 minutes Total: Thigh (Left) - 35 minutes    Complications- None  Condition-PACU - hemodynamically stable.   Brief Clinical Note  Rickey Anderson is a 57 y.o. year old male with end stage OA of both knees  with progressively worsening pain and dysfunction. He has constant pain, with activity and at rest and significant functional deficits with difficulties even with ADLs. He has had extensive non-op management including analgesics, injections of cortisone and viscosupplements, and home exercise program, but remains in significant pain with significant dysfunction.Radiographs show bone on bone arthritis medial and patellofemoral compartments both knees. We discussed doing both knees at the same setting vs. one knee at a time including procedure, risks, potential complications and rehab course associated with each and he elected for bilateral TKA. He presents now for bilateralTotal Knee Arthroplasty.    Procedure in detail---   The patient is brought into the operating room and positioned supine on the operating table. After successful administration of  General and Epidural,   a tourniquet is placed high on the  Bilateral thigh(s) and the lower extremities are prepped and draped in the usual sterile fashion. Time out is performed by the operating team and then the  Left lower extremity is wrapped in Esmarch, knee flexed and the tourniquet inflated to 300 mmHg.       A midline incision is made with a ten blade through the  subcutaneous tissue to the level of the extensor mechanism. A fresh blade is used to make a medial parapatellar arthrotomy. Soft tissue over the proximal medial tibia is subperiosteally elevated to the joint line with a knife and into the semimembranosus bursa with a Cobb elevator. Soft tissue over the proximal lateral tibia is elevated with attention being paid to avoiding the patellar tendon on the tibial tubercle. The patella is everted, knee flexed 90 degrees and the ACL and PCL are removed. Findings are bone on bone medial and patellofemoral with massive global osteophytes.        The drill is used to create a starting hole in the distal femur and the canal is thoroughly irrigated with sterile saline to remove the fatty contents. The 5 degree Left  valgus alignment guide is placed into the femoral canal and the distal femoral cutting block is pinned to remove 10 mm off the distal femur. Resection is made with an oscillating saw.      The tibia is subluxed forward and the menisci are removed. The extramedullary alignment guide is placed referencing proximally at the medial aspect of the tibial tubercle and distally along the second metatarsal axis and tibial crest. The block is pinned to remove 2mm off the more deficient medial  side. Resection is made with an oscillating saw. Size 5is the most appropriate size for the tibia and the proximal tibia is prepared with the modular drill and keel punch for that size.      The femoral sizing guide is placed and size 5 is most appropriate. Rotation is marked off the epicondylar axis and confirmed by creating a rectangular flexion  gap at 90 degrees. The size 5 cutting block is pinned in this rotation and the anterior, posterior and chamfer cuts are made with the oscillating saw. The intercondylar block is then placed and that cut is made.      Trial size 5 tibial component, trial size 5 posterior stabilized femur and a 10  mm posterior stabilized rotating platform  insert trial is placed. Full extension is achieved with excellent varus/valgus and anterior/posterior balance throughout full range of motion. The patella is everted and thickness measured to be 27  mm. Free hand resection is taken to 15 mm, a 38 template is placed, lug holes are drilled, trial patella is placed, and it tracks normally. Osteophytes are removed off the posterior femur with the trial in place. All trials are removed and the cut bone surfaces prepared with pulsatile lavage. Cement is mixed and once ready for implantation, the size 5 tibial implant, size  5 posterior stabilized femoral component, and the size 38 patella are cemented in place and the patella is held with the clamp. The trial insert is placed and the knee held in full extension.  All extruded cement is removed and once the cement is hard the permanent 10 mm posterior stabilized rotating platform insert is placed into the tibial tray.      The wound is copiously irrigated with saline solution and the extensor mechanism closed over a autovac drain with #1 V-loc suture. The tourniquet is released for a total tourniquet time of 35  minutes. Flexion against gravity is 140 degrees and the patella tracks normally. Subcutaneous tissue is closed with 2.0 vicryl and subcuticular with running 4.0 Monocryl.       The  Right lower extremity is wrapped in Esmarch, knee flexed and the tourniquet inflated to 300 mmHg.       A midline incision is made with a ten blade through the subcutaneous tissue to the level of the extensor mechanism. A fresh blade is used to make a medial parapatellar arthrotomy. Soft tissue over the proximal medial tibia is subperiosteally elevated to the joint line with a knife and into the semimembranosus bursa with a Cobb elevator. Soft tissue over the proximal lateral tibia is elevated with attention being paid to avoiding the patellar tendon on the tibial tubercle. The patella is everted, knee flexed 90 degrees and the ACL  and PCL are removed. Findings are bone on bone medial and patellofemoral with large medial osteophytes.        The drill is used to create a starting hole in the distal femur and the canal is thoroughly irrigated with sterile saline to remove the fatty contents. The 5 degree Right  valgus alignment guide is placed into the femoral canal and the distal femoral cutting block is pinned to remove 10 mm off the distal femur. Resection is made with an oscillating saw.      The tibia is subluxed forward and the menisci are removed. The extramedullary alignment guide is placed referencing proximally at the medial aspect of the tibial tubercle and distally along the second metatarsal axis and tibial crest. The block is pinned to remove 2mm off the more deficient medial  side. Resection is made with an oscillating saw. Size 5 is the most appropriate size for the tibia and the proximal tibia is prepared with the modular drill and keel punch for that size.      The femoral sizing guide is placed and size 5 is most appropriate. Rotation is marked  off the epicondylar axis and confirmed by creating a rectangular flexion gap at 90 degrees. The size 5 cutting block is pinned in this rotation and the anterior, posterior and chamfer cuts are made with the oscillating saw. The intercondylar block is then placed and that cut is made.      Trial size 5 tibial component, trial size 5 posterior stabilized femur and a 10  mm posterior stabilized rotating platform insert trial is placed. Full extension is achieved with excellent varus/valgus and anterior/posterior balance throughout full range of motion. The patella is everted and thickness measured to be 27  mm. Free hand resection is taken to 15 mm, a 38 template is placed, lug holes are drilled, trial patella is placed, and it tracks normally. Osteophytes are removed off the posterior femur with the trial in place. All trials are removed and the cut bone surfaces prepared with  pulsatile lavage. Cement is mixed and once ready for implantation, the size 5 tibial implant, size  5 posterior stabilized femoral component, and the size 38 patella are cemented in place and the patella is held with the clamp. The trial insert is placed and the knee held in full extension.  All extruded cement is removed and once the cement is hard the permanent 10 mm posterior stabilized rotating platform insert is placed into the tibial tray.      The wound is copiously irrigated with saline solution and the extensor mechanism closed over a autovac drain with #1 V-loc suture. The tourniquet is released for a total tourniquet time of 40  minutes. Flexion against gravity is 140 degrees and the patella tracks normally. Subcutaneous tissue is closed with 2.0 vicryl and subcuticular with running 4.0 Monocryl. The incisions are cleaned and dried and steri-strips and  bulky sterile dressings are applied. The limbs are placed into  knee immobilizers and the patient is awakened and transported to recovery in stable condition.       Please note that a surgical assistant was a medical necessity for this procedure in order to perform it in a safe and expeditious manner. Surgical assistant was necessary to retract the ligaments and vital neurovascular structures to prevent injury to them and also necessary for proper positioning of the limb to allow for anatomic placement of the prosthesis.   Gus Rankin Sidney Kann, MD    07/27/2012, 3:55 PM

## 2012-07-27 NOTE — Anesthesia Postprocedure Evaluation (Signed)
  Anesthesia Post-op Note  Patient: Rickey Anderson  Procedure(s) Performed: Procedure(s) (LRB): TOTAL KNEE BILATERAL (Bilateral)  Patient Location: PACU  Anesthesia Type: GA combined with regional for post-op pain  Level of Consciousness: awake and alert   Airway and Oxygen Therapy: Patient Spontanous Breathing  Post-op Pain: mild  Post-op Assessment: Post-op Vital signs reviewed, Patient's Cardiovascular Status Stable, Respiratory Function Stable, Patent Airway and No signs of Nausea or vomiting  Last Vitals:  Filed Vitals:   07/27/12 1653  BP:   Pulse:   Temp:   Resp: 16    Post-op Vital Signs: stable   Complications: No apparent anesthesia complications

## 2012-07-27 NOTE — Anesthesia Procedure Notes (Signed)
Epidural Patient location during procedure: pre-op Start time: 07/27/2012 1:10 PM End time: 07/27/2012 1:25 PM  Staffing Anesthesiologist: Ronelle Nigh L Performed by: anesthesiologist   Preanesthetic Checklist Completed: patient identified, site marked, surgical consent, pre-op evaluation, timeout performed, IV checked, risks and benefits discussed, monitors and equipment checked and post-op pain management  Epidural Patient position: sitting Prep: Betadine Patient monitoring: heart rate and continuous pulse ox Approach: midline Injection technique: LOR air  Needle:  Needle type: Tuohy  Needle gauge: 18 G Catheter size: 20 Guage Test dose: 2% lidocaine with Epi 1:200 K  Assessment Sensory level: T4 Events: paresthesia  Additional Notes Had paresthesia on first attempt and moved to interspace below with no paresthesia.  No CSF or heme.Reason for block:post-op pain management

## 2012-07-28 ENCOUNTER — Encounter (HOSPITAL_COMMUNITY): Payer: Self-pay | Admitting: Orthopedic Surgery

## 2012-07-28 LAB — BASIC METABOLIC PANEL
BUN: 12 mg/dL (ref 6–23)
CO2: 29 mEq/L (ref 19–32)
Calcium: 8.4 mg/dL (ref 8.4–10.5)
Creatinine, Ser: 0.94 mg/dL (ref 0.50–1.35)
GFR calc Af Amer: >90 mL/min (ref >90)

## 2012-07-28 LAB — CBC
MCH: 29.6 pg (ref 26.0–34.0)
MCV: 89.2 fL (ref 78.0–100.0)
Platelets: 228 10*3/uL (ref 150–400)
RDW: 13.9 % (ref 11.5–15.5)

## 2012-07-28 LAB — GLUCOSE, CAPILLARY: Glucose-Capillary: 143 mg/dL — ABNORMAL HIGH (ref 70–99)

## 2012-07-28 LAB — PROTIME-INR: Prothrombin Time: 13.3 seconds (ref 11.6–15.2)

## 2012-07-28 MED ORDER — SODIUM CHLORIDE 0.9 % IV SOLN
INTRAVENOUS | Status: DC
  Administered 2012-07-28 – 2012-07-29 (×4): via EPIDURAL
  Filled 2012-07-28 (×8): qty 25

## 2012-07-28 MED ORDER — COUMADIN BOOK
1.0000 | Freq: Once | Status: AC
  Administered 2012-07-28: 1
  Filled 2012-07-28: qty 1

## 2012-07-28 MED ORDER — NON FORMULARY: 20.0000 mg | Freq: Every day | Status: DC

## 2012-07-28 MED ORDER — WARFARIN VIDEO: Freq: Once | Status: DC

## 2012-07-28 MED ORDER — WARFARIN - PHARMACIST DOSING INPATIENT: Freq: Every day | Status: DC

## 2012-07-28 MED ORDER — WARFARIN SODIUM 7.5 MG PO TABS
7.5000 mg | ORAL_TABLET | Freq: Once | ORAL | Status: AC
  Administered 2012-07-28: 7.5 mg via ORAL
  Filled 2012-07-28: qty 1

## 2012-07-28 MED ORDER — SODIUM CHLORIDE 0.9 % IV SOLN: INTRAVENOUS | Status: DC

## 2012-07-28 MED ORDER — OMEPRAZOLE 20 MG PO CPDR
20.0000 mg | DELAYED_RELEASE_CAPSULE | Freq: Every day | ORAL | Status: DC
  Administered 2012-07-28 – 2012-08-01 (×5): 20 mg via ORAL
  Filled 2012-07-28 (×5): qty 1

## 2012-07-28 NOTE — Progress Notes (Signed)
07/28/12 1500  PT Visit Information  Last PT Received On 07/28/12  Assistance Needed +2  PT Time Calculation  PT Start Time 1445  PT Stop Time 1520  PT Time Calculation (min) 35 min  Subjective Data  Patient Stated Goal CIR  Precautions  Precautions Knee  Required Braces or Orthoses Knee Immobilizer - Right;Knee Immobilizer - Left  Knee Immobilizer - Right Discontinue once straight leg raise with < 10 degree lag  Knee Immobilizer - Left Discontinue once straight leg raise with < 10 degree lag  Restrictions  RLE Weight Bearing WBAT  LLE Weight Bearing WBAT  Cognition  Arousal/Alertness Awake/alert  Behavior During Therapy WFL for tasks assessed/performed  Overall Cognitive Status Within Functional Limits for tasks assessed  Bed Mobility  Bed Mobility Sit to Supine  Sit to Supine 1: +2 Total assist  Sit to Supine: Patient Percentage 70%  Details for Bed Mobility Assistance verbal cues for technique; +2 for lines safety and bil LE assist  Transfers  Transfers Sit to Stand;Stand to Sit  Sit to Stand 1: +2 Total assist  Sit to Stand: Patient Percentage 50%  Stand to Sit 1: +2 Total assist;With upper extremity assist;To bed  Stand to Sit: Patient Percentage 60%  Details for Transfer Assistance verbal cues for hand placement and wt shift;   Ambulation/Gait  Ambulation/Gait Assistance 1: +2 Total assist  Ambulation/Gait: Patient Percentage 60%  Ambulation Distance (Feet) 5 Feet  Assistive device Rolling walker  Ambulation/Gait Assistance Details cues for sequence, RW distance from self and posture/truink extension; assist to advance LLE due to decr sensation  Gait Pattern Step-to pattern;Trunk flexed;Wide base of support;Antalgic  Total Joint Exercises  Ankle Circles/Pumps AROM;Both;10 reps  Quad Sets AROM;Both;10 reps  Heel Slides AROM;Left;10 reps;AAROM  Straight Leg Raises AROM;AAROM  PT - End of Session  Equipment Utilized During Treatment Gait belt;Right knee  immobilizer;Left knee immobilizer  Activity Tolerance Patient tolerated treatment well;Patient limited by fatigue  Patient left in chair;with call bell/phone within reach;with family/visitor present  Nurse Communication Mobility status  PT - Assessment/Plan  Comments on Treatment Session progressing; still with more pain in RLE and more numbness in LLE due to epidural  PT Plan Discharge plan remains appropriate;Frequency remains appropriate  PT Frequency 7X/week  Follow Up Recommendations CIR  PT equipment Other (comment) (pt to have wife check on walker at home)  Acute Rehab PT Goals  Time For Goal Achievement 08/11/12  Potential to Achieve Goals Good  Pt will go Sit to Supine/Side with min assist  PT Goal: Sit to Supine/Side - Progress Progressing toward goal  Pt will go Sit to Stand with min assist  PT Goal: Sit to Stand - Progress Progressing toward goal  Pt will go Stand to Sit with min assist  PT Goal: Stand to Sit - Progress Progressing toward goal  Pt will Ambulate 51 - 150 feet;with min assist;with rolling walker  PT Goal: Ambulate - Progress Progressing toward goal  PT General Charges  $$ ACUTE PT VISIT 1 Procedure  PT Treatments  $Gait Training 8-22 mins  $Therapeutic Exercise 8-22 mins

## 2012-07-28 NOTE — Progress Notes (Signed)
ANTICOAGULATION CONSULT NOTE - Initial Consult  Pharmacy Consult for warfarin Indication: VTE prophylaxis    No Known Allergies  Patient Measurements: Height: 5\' 7"  (170.2 cm) Weight: 221 lb (100.245 kg) IBW/kg (Calculated) : 66.1 Heparin Dosing Weight:   Vital Signs: Temp: 97.8 F (36.6 C) (05/14 2314) Temp src: Oral (05/14 2314) BP: 108/70 mmHg (05/14 2314) Pulse Rate: 88 (05/14 2314)  Labs: No results found for this basename: HGB, HCT, PLT, APTT, LABPROT, INR, HEPARINUNFRC, CREATININE, CKTOTAL, CKMB, TROPONINI,  in the last 72 hours  Estimated Creatinine Clearance: 116.2 ml/min (by C-G formula based on Cr of 0.76).   Medical History: Past Medical History  Diagnosis Date  . GERD (gastroesophageal reflux disease)   . Diabetes mellitus   . Hyperlipidemia   . Herpes   . Norovirus     1'14- no reoccurring issues    Medications:  Prescriptions prior to admission  Medication Sig Dispense Refill  . glimepiride (AMARYL) 2 MG tablet Take 1 tablet (2 mg total) by mouth 2 (two) times daily.  180 tablet  3  . Liraglutide (VICTOZA) 18 MG/3ML SOLN injection Inject 0.2 mLs (1.2 mg total) into the skin daily.  36 mL  3  . metFORMIN (GLUCOPHAGE) 1000 MG tablet Take 1 tablet (1,000 mg total) by mouth 2 (two) times daily with a meal.  60 tablet  11  . Multiple Vitamin (MULTIVITAMIN WITH MINERALS) TABS Take 1 tablet by mouth daily.      Marland Kitchen omeprazole (PRILOSEC) 20 MG capsule Take 20 mg by mouth daily.        . polycarbophil (FIBERCON) 625 MG tablet Take 625 mg by mouth daily.       Scheduled:  .  ceFAZolin (ANCEF) IV  1 g Intravenous Q6H  . coumadin book  1 each Does not apply Once  . docusate sodium  100 mg Oral BID  . glimepiride  2 mg Oral BID AC  . HYDROmorphone      . insulin aspart  0-15 Units Subcutaneous TID WC  . Liraglutide  1.2 mg Subcutaneous Daily  . metFORMIN  1,000 mg Oral BID WC  . pantoprazole  40 mg Oral Daily  . promethazine      . warfarin  7.5 mg Oral  ONCE-1800  . [START ON 07/29/2012] warfarin   Does not apply Once  . Warfarin - Pharmacist Dosing Inpatient   Does not apply q1800    Assessment: Patient s/p ortho surgery.  Warfarin per pharmacy ordered.  Goal of Therapy:  INR 2-3    Plan:  Start with Coumadin 7.5 mg tonight. Check PT/INR daily. Provide Coumadin education.   Darlina Guys, Jacquenette Shone Crowford 07/28/2012,1:19 AM

## 2012-07-28 NOTE — Care Management Note (Signed)
  Page 1 of 1   07/28/2012     4:18:10 PM   CARE MANAGEMENT NOTE 07/28/2012  Patient:  Rickey Anderson, Rickey Anderson   Account Number:  [id number]  Date Initiated:  07/28/2012  Documentation initiated by:  Colleen Can  Subjective/Objective Assessment:   dx osteoarthritis bilateral knees; bilateral knee arthroplasty     Action/Plan:   CM spoke with patient. Patient wants inpatient rehab at [facility] as 1st choice and SNF rehab 2nd choice when discharged from hospital.   Anticipated DC Date:  07/31/2012   Anticipated DC Plan:  IP REHAB FACILITY  In-house referral  Clinical Social Worker      DC Planning Services  CM consult      PAC Choice  IP REHAB   Choice offered to / List presented to:  C-1 Patient           Status of service:  In process, will continue to follow Medicare Important Message given?   (If response is "NO", the following Medicare IM given date fields will be blank) Date Medicare IM given:   Date Additional Medicare IM given:    Discharge Disposition:    Per UR Regulation:  Reviewed for med. necessity/level of care/duration of stay  If discussed at Long Length of Stay Meetings, dates discussed:    Comments:

## 2012-07-28 NOTE — Progress Notes (Signed)
   Subjective: 1 Day Post-Op Procedure(s) (LRB): TOTAL KNEE BILATERAL (Bilateral) Patient reports doing well this morning.  Did get some sleep last night. Patient seen in rounds with Dr. Lequita Halt. Patient is well, and has had no acute complaints or problems We will start therapy today.  Plan is to go Rehab after hospital stay.  [facility] Inpatient Rehab consult ordered.  Objective: Vital signs in last 24 hours: Temp:  [97.5 F (36.4 C)-99.8 F (37.7 C)] 99.6 F (37.6 C) (05/15 0527) Pulse Rate:  [83-103] 103 (05/15 0527) Resp:  [11-18] 14 (05/15 0527) BP: (108-155)/(64-97) 113/72 mmHg (05/15 0527) SpO2:  [94 %-100 %] 99 % (05/15 0527) Weight:  [100.245 kg (221 lb)] 100.245 kg (221 lb) (05/14 1900)  Intake/Output from previous day:  Intake/Output Summary (Last 24 hours) at 07/28/12 0751 Last data filed at 07/28/12 0549  Gross per 24 hour  Intake 5701.66 ml  Output   3595 ml  Net 2106.66 ml    Intake/Output this shift: UOP 2000 sonce MN +2106   Labs:  Recent Labs  07/28/12 0405  HGB 9.9*    Recent Labs  07/28/12 0405  WBC 8.7  RBC 3.34*  HCT 29.8*  PLT 228    Recent Labs  07/28/12 0405  NA 136  K 4.1  CL 100  CO2 29  BUN 12  CREATININE 0.94  GLUCOSE 187*  CALCIUM 8.4    Recent Labs  07/28/12 0405  INR 1.02    EXAM General - Patient is Alert, Appropriate and Oriented Extremity - Neurovascular intact Sensation intact distally Dorsiflexion/Plantar flexion intact Dressing - dressing C/D/I Motor Function - intact, moving feet and toes well on exam.  Both Hemovacs pulled without difficulty.  Past Medical History  Diagnosis Date  . GERD (gastroesophageal reflux disease)   . Diabetes mellitus   . Hyperlipidemia   . Herpes   . Norovirus     1'14- no reoccurring issues    Assessment/Plan: 1 Day Post-Op Procedure(s) (LRB): TOTAL KNEE BILATERAL (Bilateral) Principal Problem:   OA (osteoarthritis) of knee  Estimated body mass index is 34.61  kg/(m^2) as calculated from the following:   Height as of this encounter: 5\' 7"  (1.702 m).   Weight as of this encounter: 100.245 kg (221 lb). Up with therapy Continue foley due to strict I&O and epiduarl in place Continue foley for now.  Will keep foley until tomorrow and will not be removed until at least 6-8 hours following the removal of the epidural catheter.  DVT Prophylaxis - Lovenox and Coumadin, Lovenox will not start until tomorrow afternoon following removal of the epidural. First dose of Coumadin this evening. Weight-Bearing as tolerated to both leg  No vaccines.  Continue O2 and Pulse OX   Take Coumadin for four weeks and then discontinue.  The dose may need to be adjusted based upon the INR.  Please follow the INR and titrate Coumadin dose for a therapeutic range between 2.0 and 3.0 INR.  After completing the four weeks of Coumadin, the patient may stop the Coumadin and then start 81 mg Aspirin daily.  Lovenox injections will start tomorrow evening after the epidural has been removed and continue until the INR is therapeutic at or greater than 2.0.  When INR reaches the therapeutic level of equal to or greater than 2.0, the patient may discontinue the Lovenox injections.  PERKINS, ALEXZANDREW 07/28/2012, 7:51 AM

## 2012-07-28 NOTE — Progress Notes (Signed)
Patient c/o epidural not giving pain relief and request increase in epidural rate. Dr. Acey Lav MD notified via telephone. T.O.V. To increase epidural rate  from 4ml/hr. to 59ml/hr. Rate adjusted at bedside per 2 RNs. Pharmacy notified via telephone of change in rate.

## 2012-07-28 NOTE — Progress Notes (Signed)
Post op Day #1 s/p Bilat TKR  Epidural for post op pain. Bupiv 0.1% at 15cc/hr.  S: pt completely pain free on R leg but complaining of aching discomfort in L leg.  O: awake. Sitting up in chair in NAD. Talkative. Afebrile VSS  A/P: Continue epidural. Switch to 0.125% bupiv. Reluctant to pull catheter back and risk losing pain relief in R leg. Pt understands and agrees. Bolus of 5 cc 1% lido given. Plan to D/c catheter tomorrow AM.  Acey Lav

## 2012-07-28 NOTE — Evaluation (Signed)
Physical Therapy Evaluation Patient Details Name: Rickey Anderson MRN: [id number] DOB: 1955-05-11 Today's Date: 07/28/2012 Time: 0076-2263 PT Time Calculation (min): 31 min  PT Assessment / Plan / Recommendation Clinical Impression  pt is s/p bil TKAs and will benefit from PT to maximize independence and safety prior to next venue of care; Pt would benefit from CIR level therapies to allow return to I lifestyle    PT Assessment  Patient needs continued PT services    Follow Up Recommendations  CIR    Does the patient have the potential to tolerate intense rehabilitation      Barriers to Discharge        Equipment Recommendations   (TBA)    Recommendations for Other Services     Frequency 7X/week    Precautions / Restrictions Precautions Precautions: Knee Required Braces or Orthoses: Knee Immobilizer - Right;Knee Immobilizer - Left Knee Immobilizer - Right: Discontinue once straight leg raise with < 10 degree lag Knee Immobilizer - Left: Discontinue once straight leg raise with < 10 degree lag Restrictions RLE Weight Bearing: Weight bearing as tolerated LLE Weight Bearing: Weight bearing as tolerated   Pertinent Vitals/Pain LLE pain, RN aware      Mobility  Bed Mobility Bed Mobility: Supine to Sit Supine to Sit: 1: +2 Total assist;With rails Supine to Sit: Patient Percentage: 70% Details for Bed Mobility Assistance: verbal cues for technique; +2 for lines safety and bil LE assist Transfers Transfers: Sit to Stand;Stand to Sit Sit to Stand: 1: +2 Total assist;From bed;From elevated surface;With upper extremity assist Sit to Stand: Patient Percentage: 50% Stand to Sit: 1: +2 Total assist;To chair/3-in-1;With upper extremity assist Details for Transfer Assistance: verbal cues for hand placement and wt shift;  Ambulation/Gait Ambulation/Gait Assistance: 1: +2 Total assist Ambulation/Gait: Patient Percentage: 60% Ambulation Distance (Feet): 8 Feet Assistive  device: Rolling walker Ambulation/Gait Assistance Details: cues for sequence, RW distance from self and posture/truink extension; assist to advance LLE due to decr sensation Gait Pattern: Step-to pattern;Trunk flexed;Wide base of support;Antalgic    Exercises Total Joint Exercises Ankle Circles/Pumps: AROM;Both;10 reps Quad Sets: AROM;Both;5 reps Heel Slides: AROM;Left;10 reps   PT Diagnosis: Difficulty walking;Acute pain  PT Problem List: Decreased strength;Decreased range of motion;Decreased activity tolerance;Decreased mobility;Decreased knowledge of precautions;Decreased knowledge of use of DME PT Treatment Interventions: DME instruction;Gait training;Functional mobility training;Therapeutic activities;Therapeutic exercise;Patient/family education   PT Goals Acute Rehab PT Goals PT Goal Formulation: With patient Time For Goal Achievement: 08/11/12 Potential to Achieve Goals: Good Pt will go Supine/Side to Sit: with min assist PT Goal: Supine/Side to Sit - Progress: Goal set today Pt will go Sit to Supine/Side: with min assist PT Goal: Sit to Supine/Side - Progress: Goal set today Pt will go Sit to Stand: with min assist PT Goal: Sit to Stand - Progress: Goal set today Pt will go Stand to Sit: with min assist PT Goal: Stand to Sit - Progress: Goal set today Pt will Ambulate: 51 - 150 feet;with min assist;with rolling walker PT Goal: Ambulate - Progress: Goal set today  Visit Information  Last PT Received On: 07/28/12 Assistance Needed: +2    Subjective Data  Subjective: one leg is hurting Patient Stated Goal: CIR   Prior Functioning  Home Living Lives With: Spouse Available Help at Discharge:  (works days) Type of Home: House Home Access: Stairs to enter Secretary/administrator of Steps: 2 Home Layout: One level Home Adaptive Equipment: Other (comment) Additional Comments: has a walker, not sure if it  is rolling or standard; also has toilet riser and carex stand  assist Prior Function Level of Independence: Independent Able to Take Stairs?: Yes Communication Communication: No difficulties    Cognition  Cognition Arousal/Alertness: Awake/alert Behavior During Therapy: WFL for tasks assessed/performed Overall Cognitive Status: Within Functional Limits for tasks assessed    Extremity/Trunk Assessment Right Upper Extremity Assessment RUE ROM/Strength/Tone: [facility] for tasks assessed Left Upper Extremity Assessment LUE ROM/Strength/Tone: WFL for tasks assessed Right Lower Extremity Assessment RLE ROM/Strength/Tone: Deficits RLE ROM/Strength/Tone Deficits: ankle WFL; able to assist with SLR Left Lower Extremity Assessment LLE ROM/Strength/Tone: Deficits LLE ROM/Strength/Tone Deficits: ankle WFL; quads 2+/5 but thigh/knee numb due to epidural LLE Sensation: Deficits LLE Sensation Deficits: epidural affecting LLE > RLE Trunk Assessment Trunk Assessment: Normal   Balance    End of Session PT - End of Session Equipment Utilized During Treatment: Gait belt;Right knee immobilizer;Left knee immobilizer Patient left: in chair;with call bell/phone within reach  GP     [facility] 07/28/2012, 12:40 PM

## 2012-07-28 NOTE — Progress Notes (Signed)
Clinical Social Work Department BRIEF PSYCHOSOCIAL ASSESSMENT 07/28/2012  Patient:  Rickey Anderson, Rickey Anderson     Account Number:  [id number]     Admit date:  07/27/2012  Clinical Social Worker:  Candie Chroman  Date/Time:  07/28/2012 01:23 PM  Referred by:  Physician  Date Referred:  07/28/2012 Referred for  SNF Placement   Other Referral:   Interview type:  Patient Other interview type:    PSYCHOSOCIAL DATA Living Status:  WIFE Admitted from facility:   Level of care:   Primary support name:  Taran Haynesworth Primary support relationship to patient:  SPOUSE Degree of support available:   supportive    CURRENT CONCERNS Current Concerns  Post-Acute Placement   Other Concerns:    SOCIAL WORK ASSESSMENT / PLAN Pt is a 57 yr old gentleman living at home prior to hospitalization. CSW met with pt to assist with d/c planning. Pt will need rehab followng hospital d/c. He is interested in CIR. Pt is willing to consider SNF placement if CIR is unable to assist. CSW will initiate SNF search and provide bed offers as received.   Assessment/plan status:  Psychosocial Support/Ongoing Assessment of Needs Other assessment/ plan:   CIR vs SNF   Information/referral to community resources:   SNF list provided.    PATIENT'S/FAMILY'S RESPONSE TO PLAN OF CARE: Pt would like to have Rehab at [facility] when ready for d/c.   Cori Razor LCSW [phone]

## 2012-07-28 NOTE — Consult Note (Signed)
Physical Medicine and Rehabilitation Consult Reason for Consult: Bilateral TKA Referring Physician: Dr.Alusio   HPI: Rickey Anderson is a 57 y.o. right-handed male admitted 07/27/2012 with end stage changes bilateral knees secondary to osteoarthritis and no relief with conservative care. Patient with noted procedures on the knees include arthroscopy and meniscectomy. Underwent bilateral total knee arthroplasty 07/27/2012 per Dr. Despina Hick. Placed on Coumadin for DVT prophylaxis and weightbearing as tolerated bilateral lower extremities. Postoperative pain management. Acute blood loss anemia with hemoglobin 9.9 and monitored. Physical and occupational therapy evaluations pending. M.D. as requested physical medicine rehabilitation consult to consider inpatient rehabilitation services Patient states that he was riding exercise bike prior to surgery.  Review of Systems  Gastrointestinal:       Reflux  Musculoskeletal: Positive for myalgias and joint pain.  Neurological: Negative for weakness.  All other systems reviewed and are negative.   Past Medical History  Diagnosis Date  . GERD (gastroesophageal reflux disease)   . Diabetes mellitus   . Hyperlipidemia   . Herpes   . Norovirus     1'14- no reoccurring issues   Past Surgical History  Procedure Laterality Date  . Knee surgery      both knees-scopes  . Tonsillectomy    . Functional endoscopic sinus surgery      Deviated septum repair 10'13  . Vasectomy    . Eardrum      hx. perforated ear drum surgery right  . Total knee arthroplasty Bilateral 07/27/2012    Procedure: TOTAL KNEE BILATERAL;  Surgeon: Loanne Drilling, MD;  Location: [facility] ORS;  Service: Orthopedics;  Laterality: Bilateral;   Family History  Problem Relation Age of Onset  . Hypertension    . Heart disease    . Alcohol abuse    . Depression     Social History:  reports that he quit smoking about 15 years ago. He does not have any smokeless tobacco history on file.  He reports that he does not drink alcohol or use illicit drugs. Allergies: No Known Allergies Medications Prior to Admission  Medication Sig Dispense Refill  . glimepiride (AMARYL) 2 MG tablet Take 1 tablet (2 mg total) by mouth 2 (two) times daily.  180 tablet  3  . Liraglutide (VICTOZA) 18 MG/3ML SOLN injection Inject 0.2 mLs (1.2 mg total) into the skin daily.  36 mL  3  . metFORMIN (GLUCOPHAGE) 1000 MG tablet Take 1 tablet (1,000 mg total) by mouth 2 (two) times daily with a meal.  60 tablet  11  . Multiple Vitamin (MULTIVITAMIN WITH MINERALS) TABS Take 1 tablet by mouth daily.      Marland Kitchen omeprazole (PRILOSEC) 20 MG capsule Take 20 mg by mouth daily.        . polycarbophil (FIBERCON) 625 MG tablet Take 625 mg by mouth daily.        Home:    Functional History:   Functional Status:  Mobility:          ADL:    Cognition: Cognition Orientation Level: Oriented X4    Blood pressure 113/72, pulse 103, temperature 99.6 F (37.6 C), temperature source Oral, resp. rate 14, height 5\' 7"  (1.702 m), weight 100.245 kg (221 lb), SpO2 99.00%. Physical Exam  Vitals reviewed. Constitutional: He is oriented to person, place, and time.  HENT:  Head: Normocephalic.  Eyes: EOM are normal.  Neck: Neck supple. No thyromegaly present.  Cardiovascular: Normal rate and regular rhythm.   Pulmonary/Chest: Effort normal and breath sounds normal.  No respiratory distress.  Abdominal: Soft. Bowel sounds are normal. He exhibits no distension.  Neurological: He is alert and oriented to person, place, and time.  Skin:  Bilateral knee incisions are dressed  Psychiatric: He has a normal mood and affect.   motor strength is 5/5 in bilateral deltoid, biceps, triceps, grip 3 minus right hip flexion 1/5 left hip flexion knee extension not tested secondary to knee extension splints ankle dorsiflexion plantar flexion 4/5 bilateral Sensation intact to light touch bilaterally.  Results for orders placed during  the hospital encounter of 07/27/12 (from the past 24 hour(s))  GLUCOSE, CAPILLARY     Status: Abnormal   Collection Time    07/27/12 10:50 AM      Result Value Range   Glucose-Capillary 114 (*) 70 - 99 mg/dL   Comment 1 Documented in Chart    GLUCOSE, CAPILLARY     Status: None   Collection Time    07/27/12  4:27 PM      Result Value Range   Glucose-Capillary 79  70 - 99 mg/dL   Comment 1 Documented in Chart    GLUCOSE, CAPILLARY     Status: Abnormal   Collection Time    07/27/12  6:44 PM      Result Value Range   Glucose-Capillary 114 (*) 70 - 99 mg/dL  CBC     Status: Abnormal   Collection Time    07/28/12  4:05 AM      Result Value Range   WBC 8.7  4.0 - 10.5 K/uL   RBC 3.34 (*) 4.22 - 5.81 MIL/uL   Hemoglobin 9.9 (*) 13.0 - 17.0 g/dL   HCT 16.1 (*) 09.6 - 04.5 %   MCV 89.2  78.0 - 100.0 fL   MCH 29.6  26.0 - 34.0 pg   MCHC 33.2  30.0 - 36.0 g/dL   RDW 40.9  81.1 - 91.4 %   Platelets 228  150 - 400 K/uL  BASIC METABOLIC PANEL     Status: Abnormal   Collection Time    07/28/12  4:05 AM      Result Value Range   Sodium 136  135 - 145 mEq/L   Potassium 4.1  3.5 - 5.1 mEq/L   Chloride 100  96 - 112 mEq/L   CO2 29  19 - 32 mEq/L   Glucose, Bld 187 (*) 70 - 99 mg/dL   BUN 12  6 - 23 mg/dL   Creatinine, Ser 7.82  0.50 - 1.35 mg/dL   Calcium 8.4  8.4 - 95.6 mg/dL   GFR calc non Af Amer >90  >90 mL/min   GFR calc Af Amer >90  >90 mL/min  PROTIME-INR     Status: None   Collection Time    07/28/12  4:05 AM      Result Value Range   Prothrombin Time 13.3  11.6 - 15.2 seconds   INR 1.02  0.00 - 1.49   No results found.  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 Does the need for close, 24 hr/day medical supervision in concert with the patient's rehab needs make it unreasonable for this patient to be served in a less intensive setting? Yes 2. Co-Morbidities requiring supervision/potential complications: Postoperative  anemia, diabetes 3. Due to bladder management, bowel management, safety, skin/wound care, disease management, medication administration, pain management and patient education, does the patient require 24 hr/day rehab nursing? Yes 4. Does the patient require coordinated care  of a physician, rehab nurse, PT (1-2 hrs/day, 5 days/week) and OT (1-2 hrs/day, 5 days/week) to address physical and functional deficits in the context of the above medical diagnosis(es)? Yes Addressing deficits in the following areas: balance, endurance, locomotion, strength, transferring, bowel/bladder control, bathing, dressing, feeding, grooming and toileting 5. Can the patient actively participate in an intensive therapy program of at least 3 hrs of therapy per day at least 5 days per week? Potentially 6. The potential for patient to make measurable gains while on inpatient rehab is good 7. Anticipated functional outcomes upon discharge from inpatient rehab are modified independent mobility with PT, modified independent ADLs with OT, not applicable with SLP. 8. Estimated rehab length of stay to reach the above functional goals is: 7 days 9. Does the patient have adequate social supports to accommodate these discharge functional goals? Potentially 10. Anticipated D/C setting: Home 11. Anticipated post D/C treatments: HH therapy 12. Overall Rehab/Functional Prognosis: excellent  RECOMMENDATIONS: This patient's condition is appropriate for continued rehabilitative care in the following setting: CIR Patient has agreed to participate in recommended program. Yes Note that insurance prior authorization may be required for reimbursement for recommended care.  Comment: Need to recheck functional status once epidural catheter is out    07/28/2012

## 2012-07-28 NOTE — Progress Notes (Signed)
Patient with uncontrolled pain, especially in the right knee, despite oral pain meds, muscle relaxers and IV pushes. Anesthesiology called. Will come up to see patient. Bupivicaine Epidural currently running at 15 mL/hr. Monitoring closely. La Shehan Jackson, [place] 07/28/2012 1:52 PM

## 2012-07-28 NOTE — Plan of Care (Signed)
Problem: Phase I Progression Outcomes Goal: Dangle or out of bed evening of surgery Outcome: Not Applicable Date Met:  07/28/12 epidural

## 2012-07-29 DIAGNOSIS — D62 Acute posthemorrhagic anemia: Secondary | ICD-10-CM | POA: Diagnosis not present

## 2012-07-29 LAB — GLUCOSE, CAPILLARY
Glucose-Capillary: 154 mg/dL — ABNORMAL HIGH (ref 70–99)
Glucose-Capillary: 183 mg/dL — ABNORMAL HIGH (ref 70–99)
Glucose-Capillary: 236 mg/dL — ABNORMAL HIGH (ref 70–99)
Glucose-Capillary: 176 mg/dL — ABNORMAL HIGH (ref 70–99)

## 2012-07-29 LAB — CBC
HCT: 26.9 % — ABNORMAL LOW (ref 39.0–52.0)
Hemoglobin: 9.2 g/dL — ABNORMAL LOW (ref 13.0–17.0)
MCH: 30.6 pg (ref 26.0–34.0)
MCV: 89.4 fL (ref 78.0–100.0)
RBC: 3.01 MIL/uL — ABNORMAL LOW (ref 4.22–5.81)
WBC: 11.9 10*3/uL — ABNORMAL HIGH (ref 4.0–10.5)

## 2012-07-29 LAB — BASIC METABOLIC PANEL
BUN: 9 mg/dL (ref 6–23)
CO2: 31 mEq/L (ref 19–32)
Calcium: 8.8 mg/dL (ref 8.4–10.5)
Chloride: 99 mEq/L (ref 96–112)
Creatinine, Ser: 0.9 mg/dL (ref 0.50–1.35)
Glucose, Bld: 143 mg/dL — ABNORMAL HIGH (ref 70–99)

## 2012-07-29 LAB — PROTIME-INR: Prothrombin Time: 14.8 seconds (ref 11.6–15.2)

## 2012-07-29 MED ORDER — HYDROMORPHONE HCL 2 MG PO TABS
2.0000 mg | ORAL_TABLET | ORAL | Status: DC | PRN
  Administered 2012-07-29 (×2): 2 mg via ORAL
  Administered 2012-07-29 – 2012-08-01 (×16): 4 mg via ORAL
  Filled 2012-07-29 (×5): qty 2
  Filled 2012-07-29: qty 1
  Filled 2012-07-29 (×3): qty 2
  Filled 2012-07-29: qty 1
  Filled 2012-07-29 (×8): qty 2

## 2012-07-29 MED ORDER — ENOXAPARIN SODIUM 30 MG/0.3ML ~~LOC~~ SOLN
30.0000 mg | Freq: Two times a day (BID) | SUBCUTANEOUS | Status: DC
  Administered 2012-07-30 – 2012-08-01 (×5): 30 mg via SUBCUTANEOUS
  Filled 2012-07-29 (×7): qty 0.3

## 2012-07-29 MED ORDER — ZOLPIDEM TARTRATE 5 MG PO TABS
5.0000 mg | ORAL_TABLET | Freq: Every evening | ORAL | Status: DC | PRN
  Administered 2012-07-29 – 2012-07-31 (×3): 5 mg via ORAL
  Filled 2012-07-29 (×3): qty 1

## 2012-07-29 MED ORDER — COUMADIN BOOK
Freq: Once | Status: AC
  Administered 2012-07-29: 13:00:00
  Filled 2012-07-29: qty 1

## 2012-07-29 MED ORDER — WARFARIN SODIUM 7.5 MG PO TABS
7.5000 mg | ORAL_TABLET | Freq: Once | ORAL | Status: AC
  Administered 2012-07-29: 7.5 mg via ORAL
  Filled 2012-07-29: qty 1

## 2012-07-29 NOTE — Progress Notes (Addendum)
Physical Therapy Treatment Patient Details Name: MAKIH STEFANKO MRN: [id number] DOB: 13-Sep-1955 Today's Date: 07/29/2012 Time: 1200-1224 PT Time Calculation (min): 24 min  PT Assessment / Plan / Recommendation Comments on Treatment Session  pt LLE  completely numb due to epidural, unable to attempt OOB due to this; epidural to come out at some point today; will attempt again later    Follow Up Recommendations  CIR     Does the patient have the potential to tolerate intense rehabilitation     Barriers to Discharge        Equipment Recommendations  Other (comment) (wife to check on his walker at home)    Recommendations for Other Services    Frequency 7X/week   Plan Discharge plan remains appropriate;Frequency remains appropriate    Precautions / Restrictions Precautions Precautions: Knee Required Braces or Orthoses: Knee Immobilizer - Right;Knee Immobilizer - Left Knee Immobilizer - Right: Discontinue once straight leg raise with < 10 degree lag Knee Immobilizer - Left: Discontinue once straight leg raise with < 10 degree lag Restrictions RLE Weight Bearing: Weight bearing as tolerated LLE Weight Bearing: Weight bearing as tolerated   Pertinent Vitals/Pain No c/o    Mobility       Exercises Total Joint Exercises Ankle Circles/Pumps: AROM;20 reps;Both Quad Sets: AROM;20 reps;Both Short Arc Quad: AAROM;right;20 reps Heel Slides: AROM;Both;10 reps;AAROM Straight Leg Raises: AROM;AAROM;20 reps;right Goniometric ROM: Left LE 70*;  RLE 62* AAROM   PT Diagnosis:    PT Problem List:   PT Treatment Interventions:     PT Goals Acute Rehab PT Goals Time For Goal Achievement: 08/11/12 Potential to Achieve Goals: Good  Visit Information  Last PT Received On: 07/29/12 Assistance Needed: +2    Subjective Data  Subjective: Am I going to hurt when this epidural comes out? Patient Stated Goal: CIR   Cognition  Cognition Arousal/Alertness: Awake/alert Behavior  During Therapy: WFL for tasks assessed/performed Overall Cognitive Status: Within Functional Limits for tasks assessed    Balance     End of Session PT - End of Session Equipment Utilized During Treatment: Gait belt;Right knee immobilizer;Left knee immobilizer Activity Tolerance: Patient tolerated treatment well Patient left: in bed;with call bell/phone within reach   GP     [facility] 07/29/2012, 1:19 PM

## 2012-07-29 NOTE — Progress Notes (Signed)
Rehab admissions - Evaluated for possible admission.  I met with patient early this morning.  He would like inpatient rehab admission once he is medically ready.  I have started the precert process requesting inpatient rehab admission.  I will follow up after I hear back from insurance carrier.  I have no rehab beds available until first of the week.  Call me for questions.  #[phone]

## 2012-07-29 NOTE — Progress Notes (Addendum)
ANTICOAGULATION CONSULT NOTE - Follow Up Consult  Pharmacy Consult for warfarin Indication: VTE prophylaxis    No Known Allergies  Patient Measurements: Height: 5\' 7"  (170.2 cm) Weight: 221 lb (100.245 kg) IBW/kg (Calculated) : 66.1  Vital Signs: Temp: 100 F (37.8 C) (05/16 0532) Temp src: Oral (05/16 0532) BP: 114/74 mmHg (05/16 0532) Pulse Rate: 103 (05/16 0532)  Labs:  Recent Labs  07/28/12 0405 07/29/12 0410  HGB 9.9* 9.2*  HCT 29.8* 26.9*  PLT 228 208  LABPROT 13.3 14.8  INR 1.02 1.18  CREATININE 0.94 0.90    Estimated Creatinine Clearance: 103.3 ml/min (by C-G formula based on Cr of 0.9).   Assessment: 48 yoM s/p bilateral TKA 5/14.  Warfarin for VTE prophylaxis started 5/15.  Lovenox will be started 12 hours after the epidural has been removed (planned removal today).    INR 1.18  Hgb 9.2, ABLA post-op  Goal of Therapy:  INR 2-3   Plan:   Coumadin 7.5 mg po once today.  Daily INR.  F/u appropriate initiation of Lovenox following removal of epidural. Completed warfarin education with patient.  Patient demonstrated a good understanding of the safety and use of warfarin therapy (including drug/food interactions, s/sx's bleeding, INR monitoring).   Clance Boll 07/29/2012,10:15 AM

## 2012-07-29 NOTE — Progress Notes (Signed)
PT Cancellation Note  Patient Details Name: DEANDREW HOECKER MRN: [id number] DOB: 07-19-1955   Cancelled Treatment:    Reason Eval/Treat Not Completed: Medical issues which prohibited therapy, epidural just recently removed. States L leg has more feeling. Getting into CPM at this time. Pt states to get up in AM.   [name] 07/29/2012, 3:21 PM Blanchard Kelch PT [phone]

## 2012-07-29 NOTE — Progress Notes (Signed)
Epidural catheter discontinued and removed intact. Good post op analgesia. Sterile dressing applied to epidural insertion site upon removal. No complications noted.

## 2012-07-29 NOTE — Progress Notes (Signed)
Subjective: 2 Days Post-Op Procedure(s) (LRB): TOTAL KNEE BILATERAL (Bilateral) Patient reports pain as mild.   Patient seen in rounds with Dr. Lequita Halt.  He had a fair amount of pain last night and had the epidural increased with good relief. Patient is well, but has had some minor complaints of pain in the knees, requiring pain medications Epidural to come out today.  Anticipate possible increase in pain temporarily after the epidural has been removed. Plan is to go Rehab after hospital stay.  Looking into [facility].  Objective: Vital signs in last 24 hours: Temp:  [100 F (37.8 C)-102.4 F (39.1 C)] 100 F (37.8 C) (05/16 0532) Pulse Rate:  [102-118] 103 (05/16 0532) Resp:  [16-24] 16 (05/16 0532) BP: (114-167)/(62-97) 114/74 mmHg (05/16 0532) SpO2:  [94 %-100 %] 96 % (05/16 0532)  Intake/Output from previous day:  Intake/Output Summary (Last 24 hours) at 07/29/12 0908 Last data filed at 07/29/12 0533  Gross per 24 hour  Intake 1835.83 ml  Output   3475 ml  Net -1639.17 ml    Labs:  Recent Labs  07/28/12 0405 07/29/12 0410  HGB 9.9* 9.2*    Recent Labs  07/28/12 0405 07/29/12 0410  WBC 8.7 11.9*  RBC 3.34* 3.01*  HCT 29.8* 26.9*  PLT 228 208    Recent Labs  07/28/12 0405 07/29/12 0410  NA 136 135  K 4.1 3.8  CL 100 99  BUN 12 9  CREATININE 0.94 0.90  GLUCOSE 187* 143*  CALCIUM 8.4 8.8    Recent Labs  07/28/12 0405 07/29/12 0410  INR 1.02 1.18    EXAM General - Patient is Alert, Appropriate and Oriented Extremity - Neurovascular intact Sensation intact distally Dorsiflexion/Plantar flexion intact No cellulitis present Dressing/Incision - clean, dry, no drainage, healing Motor Function - intact, moving feet and toes well on exam.    Past Medical History  Diagnosis Date  . GERD (gastroesophageal reflux disease)   . Diabetes mellitus   . Hyperlipidemia   . Herpes   . Norovirus     1'14- no reoccurring issues     Assessment/Plan: 2 Days Post-Op Procedure(s) (LRB): TOTAL KNEE BILATERAL (Bilateral) Principal Problem:   OA (osteoarthritis) of knee Active Problems:   Postoperative anemia due to acute blood loss  Estimated body mass index is 34.61 kg/(m^2) as calculated from the following:   Height as of this encounter: 5\' 7"  (1.702 m).   Weight as of this encounter: 100.245 kg (221 lb). Up with therapy Continue foley due to urinary output monitoring and she still has her epidural in place.  Will remove the catheter six hours after the epidural is removed.  Will need to note in chart when the epidural is pulled.  DVT Prophylaxis - Lovenox and Coumadin, Lovenox will not start until later this afternoon though after the epidural has been removed for twelve hours.  Will need to note in chart when the epidural is pulled. Anticipate possible increase in pain temporarily after the epidural has been removed.  Weight-Bearing as tolerated to both legs  Take Coumadin for four weeks and then discontinue.  The dose may need to be adjusted based upon the INR.  Please follow the INR and titrate Coumadin dose for a therapeutic range between 2.0 and 3.0 INR.  After completing the four weeks of Coumadin, the patient may stop the Coumadin and resume their 81 mg Aspirin daily.  Lovenox injections will start later this evening after the epidural has been removed and  continue until the INR is therapeutic at or greater than 2.0.  When INR reaches the therapeutic level of equal to or greater than 2.0, the patient may discontinue the Lovenox injections.  Rickey Anderson 07/29/2012, 9:08 AM

## 2012-07-29 NOTE — Progress Notes (Signed)
OT Cancellation Note  Patient Details Name: BONIFACIO PRUDEN MRN: [id number] DOB: 08-01-1955   Cancelled Treatment:     Pt still with epidural.  Will plan to see in am for OT eval.   Thresa Dozier 07/29/2012, 2:03 PM Marica Otter, OTR/L [phone] 07/29/2012

## 2012-07-29 NOTE — Progress Notes (Signed)
Inpatient Diabetes Program Recommendations  AACE/ADA: New Consensus Statement on Inpatient Glycemic Control (2013)  Target Ranges:  Prepandial:   less than 140 mg/dL      Peak postprandial:   less than 180 mg/dL (1-2 hours)      Critically ill patients:  140 - 180 mg/dL   Reason for Visit: Hyperglycemia Results for KEZIAH, DROTAR (MRN [id number]) as of 07/29/2012 14:14  Ref. Range 07/29/2012 07:35 07/29/2012 11:41  Glucose-Capillary Latest Range: 70-99 mg/dL 130 (H) 865 (H)     Inpatient Diabetes Program Recommendations Insulin - Meal Coverage: May benefit from small amount of meal coverage insulin - Novolog 3 units tidwc if pt eats >50% HgbA1C: Will need HgbA1C to assess glycemic control prior to hospitalization  Will follow.  Thank you. Ailene Ards, RD, LDN, CDE Inpatient Diabetes Coordinator [phone]

## 2012-07-30 LAB — GLUCOSE, CAPILLARY

## 2012-07-30 LAB — CBC
HCT: 25.6 % — ABNORMAL LOW (ref 39.0–52.0)
MCH: 29.5 pg (ref 26.0–34.0)
MCV: 87.7 fL (ref 78.0–100.0)
Platelets: 212 10*3/uL (ref 150–400)
RDW: 13.5 % (ref 11.5–15.5)
WBC: 13.7 10*3/uL — ABNORMAL HIGH (ref 4.0–10.5)

## 2012-07-30 MED ORDER — WARFARIN SODIUM 10 MG PO TABS
10.0000 mg | ORAL_TABLET | Freq: Once | ORAL | Status: AC
  Administered 2012-07-30: 10 mg via ORAL
  Filled 2012-07-30: qty 1

## 2012-07-30 NOTE — Evaluation (Signed)
Occupational Therapy Evaluation Patient Details Name: Rickey Anderson MRN: [id number] DOB: 20-Apr-1955 Today's Date: 07/30/2012 Time: 7829-5621 OT Time Calculation (min): 30 min  OT Assessment / Plan / Recommendation Clinical Impression  Pt is s/p bilateral TKA and displays decreased strength and overall a decrease in ADL independence. He will benefit from skilled OT services to further educate on AE, functional transfers and other self care.     OT Assessment  Patient needs continued OT Services    Follow Up Recommendations  CIR    Barriers to Discharge      Equipment Recommendations  3 in 1 bedside comode    Recommendations for Other Services    Frequency  Min 2X/week    Precautions / Restrictions Precautions Precautions: Knee Required Braces or Orthoses: Knee Immobilizer - Right;Knee Immobilizer - Left Knee Immobilizer - Right: Discontinue once straight leg raise with < 10 degree lag Knee Immobilizer - Left: Discontinue once straight leg raise with < 10 degree lag Restrictions RLE Weight Bearing: Weight bearing as tolerated LLE Weight Bearing: Weight bearing as tolerated        ADL  Eating/Feeding: Simulated;Independent Where Assessed - Eating/Feeding: Chair Grooming: Simulated;Wash/dry hands;Set up Where Assessed - Grooming: Supported sitting Upper Body Bathing: Simulated;Chest;Right arm;Left arm;Abdomen;Set up;Supervision/safety Where Assessed - Upper Body Bathing: Unsupported sitting Lower Body Bathing: Simulated;+2 Total assistance Lower Body Bathing: Patient Percentage: 40% Where Assessed - Lower Body Bathing: Supported sit to stand Upper Body Dressing: Simulated;Minimal assistance (IV and lines) Where Assessed - Upper Body Dressing: Unsupported sitting Lower Body Dressing: Simulated;+2 Total assistance Lower Body Dressing: Patient Percentage: 10% Where Assessed - Lower Body Dressing: Supported sit to stand Toilet Transfer: Simulated;+2 Total  assistance Toilet Transfer: Patient Percentage: 70% Toilet Transfer Method: Sit to stand;Other (comment) (walk with PT then taking steps with RW to pivot to chair) Toileting - Clothing Manipulation and Hygiene: Simulated;+2 Total assistance Toileting - Clothing Manipulation and Hygiene: Patient Percentage: 10% Where Assessed - Toileting Clothing Manipulation and Hygiene: Sit to stand from 3-in-1 or toilet Equipment Used: Rolling walker ADL Comments: Pt states pain 8/10 with activity but definitely motivated to do as much as he can. Introduced AE but hasnt practiced with it yet.    OT Diagnosis: Generalized weakness;Acute pain  OT Problem List: Decreased strength;Decreased range of motion;Decreased knowledge of use of DME or AE OT Treatment Interventions: Self-care/ADL training;Therapeutic activities;Patient/family education;DME and/or AE instruction   OT Goals Acute Rehab OT Goals OT Goal Formulation: With patient Time For Goal Achievement: 08/06/12 Potential to Achieve Goals: Good ADL Goals Pt Will Perform Lower Body Bathing: with min assist;with adaptive equipment;Sit to stand from chair;Sit to stand from bed ADL Goal: Lower Body Bathing - Progress: Goal set today Pt Will Perform Lower Body Dressing: with min assist;Sit to stand from chair;Sit to stand from bed;with adaptive equipment ADL Goal: Lower Body Dressing - Progress: Goal set today Pt Will Transfer to Toilet: with min assist;Ambulation;3-in-1;with DME ADL Goal: Toilet Transfer - Progress: Goal set today Pt Will Perform Toileting - Clothing Manipulation: with min assist;Standing ADL Goal: Toileting - Clothing Manipulation - Progress: Goal set today  Visit Information  Last OT Received On: 07/30/12 Assistance Needed: +2 PT/OT Co-Evaluation/Treatment: Yes    Subjective Data  Subjective: hello Patient Stated Goal: wants to get up and walk   Prior Functioning     Home Living Lives With: Spouse Available Help at  Discharge:  (works days) Type of Home: House Home Access: Stairs to enter [employer] of Steps:  2 Home Layout: One level Home Adaptive Equipment: Other (comment) Additional Comments: has a walker, not sure if it is rolling or standard; also has toilet riser and carex stand assist Prior Function Level of Independence: Independent Able to Take Stairs?: Yes Communication Communication: No difficulties         Vision/Perception     Cognition  Cognition Arousal/Alertness: Awake/alert Behavior During Therapy: WFL for tasks assessed/performed Overall Cognitive Status: Within Functional Limits for tasks assessed    Extremity/Trunk Assessment Right Upper Extremity Assessment RUE ROM/Strength/Tone: [facility] for tasks assessed Left Upper Extremity Assessment LUE ROM/Strength/Tone: WFL for tasks assessed     Mobility Bed Mobility Bed Mobility: Supine to Sit Supine to Sit: 4: Min assist;HOB elevated;Other (comment) (with trapeze. ) Details for Bed Mobility Assistance: min assist to guide LEs to floor as bed was elevated. Transfers Transfers: Sit to Stand;Stand to Sit Sit to Stand: 1: +2 Total assist;With upper extremity assist;From bed Sit to Stand: Patient Percentage: 70% Stand to Sit: 1: +2 Total assist;To chair/3-in-1 Stand to Sit: Patient Percentage: 70% Details for Transfer Assistance: verbal cues for hand placement and LE management     Exercise     Balance     End of Session OT - End of Session Equipment Utilized During Treatment: Gait belt Activity Tolerance: Patient limited by pain Patient left: in chair;with call bell/phone within reach  GO     Lennox Laity [phone] 07/30/2012, 2:51 PM

## 2012-07-30 NOTE — Progress Notes (Signed)
Physical Therapy Treatment Patient Details Name: Rickey Anderson MRN: [id number] DOB: 06/11/1955 Today's Date: 07/30/2012 Time: 7829-5621 PT Time Calculation (min): 44 min  PT Assessment / Plan / Recommendation Comments on Treatment Session  pt now has pain pills and controlled well. Pt ambulated x 100 feet with RW wirh 2 persons.  pt able to perform R SLR, still using KI.     Follow Up Recommendations  CIR     Does the patient have the potential to tolerate intense rehabilitation     Barriers to Discharge        Equipment Recommendations       Recommendations for Other Services Rehab consult  Frequency 7X/week   Plan Discharge plan remains appropriate;Frequency remains appropriate    Precautions / Restrictions Precautions Precautions: Knee Required Braces or Orthoses: Knee Immobilizer - Right;Knee Immobilizer - Left Knee Immobilizer - Right: Discontinue once straight leg raise with < 10 degree lag Knee Immobilizer - Left: Discontinue once straight leg raise with < 10 degree lag Restrictions RLE Weight Bearing: Weight bearing as tolerated LLE Weight Bearing: Weight bearing as tolerated   Pertinent Vitals/Pain  R 7, L5    Mobility  Bed Mobility Bed Mobility: Supine to Sit Supine to Sit: 4: Min assist;HOB elevated;Other (comment) (with trapeze. ) Details for Bed Mobility Assistance: min assist to guide LEs to floor as bed was elevated. Transfers Sit to Stand: 1: +2 Total assist;With upper extremity assist;From bed Sit to Stand: Patient Percentage: 70% Stand to Sit: 1: +2 Total assist;To chair/3-in-1 Stand to Sit: Patient Percentage: 70% Details for Transfer Assistance: verbal cues for hand placement and LE management, to walk legs forward to sit down. Ambulation/Gait Ambulation/Gait Assistance: 1: +2 Total assist Ambulation/Gait: Patient Percentage: 70% Ambulation Distance (Feet): 100 Feet Ambulation/Gait Assistance Details: cues for sequnece and for posture, at  times rested standing . Gait Pattern: Step-through pattern;Antalgic;Trunk flexed    Exercises Total Joint Exercises Ankle Circles/Pumps: AROM;20 reps;Both Quad Sets: AROM;20 reps;Both Short Arc Quad: AAROM;Both;20 reps Heel Slides: AROM;10 reps;AAROM;Both Straight Leg Raises: AROM;AAROM;20 reps;Both Goniometric ROM: L 70, R 60   PT Diagnosis:    PT Problem List:   PT Treatment Interventions:     PT Goals Acute Rehab PT Goals Pt will go Supine/Side to Sit: with min assist;with HOB 0 degrees PT Goal: Supine/Side to Sit - Progress: Progressing toward goal Pt will go Sit to Supine/Side: with min assist PT Goal: Sit to Supine/Side - Progress: Progressing toward goal Pt will go Sit to Stand: with min assist PT Goal: Sit to Stand - Progress: Progressing toward goal Pt will Ambulate: 51 - 150 feet;with min assist;with rolling walker PT Goal: Ambulate - Progress: Progressing toward goal Pt will Perform Home Exercise Program: with supervision, verbal cues required/provided PT Goal: Perform Home Exercise Program - Progress: Goal set today  Visit Information  Last PT Received On: 07/30/12 Assistance Needed: +2    Subjective Data  Subjective: I had it bad after the epidural came out. It is better.   Cognition  Cognition Arousal/Alertness: Awake/alert Behavior During Therapy: WFL for tasks assessed/performed Overall Cognitive Status: Within Functional Limits for tasks assessed    Balance     End of Session PT - End of Session Equipment Utilized During Treatment: Gait belt;Right knee immobilizer;Left knee immobilizer Activity Tolerance: Patient tolerated treatment well Patient left: in chair;with call bell/phone within reach Nurse Communication: Mobility status CPM Left Knee CPM Left Knee: On   GP     [name]  07/30/2012, 3:51 PM Blanchard Kelch PT [phone]

## 2012-07-30 NOTE — Progress Notes (Signed)
ANTICOAGULATION CONSULT NOTE - Follow Up Consult  Pharmacy Consult for warfarin Indication: VTE prophylaxis   No Known Allergies  Patient Measurements: Height: 5\' 7"  (170.2 cm) Weight: 221 lb (100.245 kg) IBW/kg (Calculated) : 66.1  Vital Signs: Temp: 100.3 F (37.9 C) (05/17 0430) Temp src: Oral (05/17 0430) BP: 137/88 mmHg (05/17 0430) Pulse Rate: 108 (05/17 0430)  Labs:  Recent Labs  07/28/12 0405 07/29/12 0410 07/30/12 0537  HGB 9.9* 9.2* 8.6*  HCT 29.8* 26.9* 25.6*  PLT 228 208 212  LABPROT 13.3 14.8 13.8  INR 1.02 1.18 1.07  CREATININE 0.94 0.90  --     Estimated Creatinine Clearance: 103.3 ml/min (by C-G formula based on Cr of 0.9).   Assessment: 67 yoM s/p bilateral TKA 5/14.  Warfarin for VTE prophylaxis started 5/15, Coumadin score = 7. Epidural discontinued 5/16 PM. Lovenox 30 mg sq q12h started 5/17 AM.    INR trended down to 1.07 today, Hgb 8.6 - no bleeding. Plan for SNF Monday.  Warfarin education completed 5/16.  Patient demonstrated a good understanding of the safety and use of warfarin therapy (including drug/food interactions, s/sx's bleeding, INR monitoring).    Goal of Therapy:  INR 2-3   Plan:   Coumadin 10 mg po x 1 tonight as boosted dose.  Daily PT/INR  Continue Lovenox 30 mg sq q12h until INR thearpeutic  Geoffry Paradise, PharmD, BCPS Pager: [phone] 11:16 AM Pharmacy #: [id number]

## 2012-07-30 NOTE — Progress Notes (Signed)
Subjective: 3 Days Post-Op Procedure(s) (LRB): TOTAL KNEE BILATERAL (Bilateral) Patient reports pain as well controlled. Tolerating PO's well. Denies vomiting or nausea. Denies SOB, CP, or calf pain.     Objective: Vital signs in last 24 hours: Temp:  [98.8 F (37.1 C)-100.3 F (37.9 C)] 100.3 F (37.9 C) (05/17 0430) Pulse Rate:  [101-108] 108 (05/17 0430) Resp:  [18-20] 20 (05/17 0430) BP: (115-166)/(68-98) 137/88 mmHg (05/17 0430) SpO2:  [93 %-100 %] 93 % (05/17 0430)  Intake/Output from previous day: 05/16 0701 - 05/17 0700 In: 500 [P.O.:60; I.V.:440] Out: 3850 [Urine:3850] Intake/Output this shift: Total I/O In: 240 [P.O.:240] Out: 300 [Urine:300]   Recent Labs  07/28/12 0405 07/29/12 0410 07/30/12 0537  HGB 9.9* 9.2* 8.6*    Recent Labs  07/29/12 0410 07/30/12 0537  WBC 11.9* 13.7*  RBC 3.01* 2.92*  HCT 26.9* 25.6*  PLT 208 212    Recent Labs  07/28/12 0405 07/29/12 0410  NA 136 135  K 4.1 3.8  CL 100 99  CO2 29 31  BUN 12 9  CREATININE 0.94 0.90  GLUCOSE 187* 143*  CALCIUM 8.4 8.8    Recent Labs  07/29/12 0410 07/30/12 0537  INR 1.18 1.07    Well nourished. Alert and oriented. Mild bilateral knee soreness. Bilateral knee dressing changed. Bilateral incisions have no spreading redness or drainage. Bilateral calves are soft and non-tender. Bilateral LE are neurovascularly intact.  Assessment/Plan: 3 Days Post-Op Procedure(s) (LRB): TOTAL KNEE BILATERAL (Bilateral) Continue current care. D/c to [facility] Monday. D/c Foley Cath. Continue PT. Radene Knee, BRYSON L 07/30/2012, 10:50 AM

## 2012-07-30 NOTE — Progress Notes (Signed)
Physical Therapy Treatment Patient Details Name: Rickey Anderson MRN: [id number] DOB: 1955-09-02 Today's Date: 07/30/2012 Time: 4098-1191 PT Time Calculation (min): 24 min  PT Assessment / Plan / Recommendation Comments on Treatment Session  In more pain this PM but did walk in hall. continue to rec. CIR, pt does not have 24/7 during week.    Follow Up Recommendations  CIR     Does the patient have the potential to tolerate intense rehabilitation     Barriers to Discharge        Equipment Recommendations       Recommendations for Other Services Rehab consult  Frequency 7X/week   Plan Discharge plan remains appropriate;Frequency remains appropriate    Precautions / Restrictions Precautions Precautions: Knee Required Braces or Orthoses: Knee Immobilizer - Right;Knee Immobilizer - Left Knee Immobilizer - Right: Discontinue once straight leg raise with < 10 degree lag Knee Immobilizer - Left: Discontinue once straight leg raise with < 10 degree lag Restrictions RLE Weight Bearing: Weight bearing as tolerated LLE Weight Bearing: Weight bearing as tolerated   Pertinent Vitals/Pain 8 L, 7 r, to get ice and meds.    Mobility  Bed Mobility  Sit to Supine: 3: Mod assist Details for Bed Mobility Assistance: assist for legs onto bed. Transfers Sit to Stand: 1: +2 Total assist;With upper extremity assist;From chair/3-in-1;With armrests Sit to Stand: Patient Percentage: 60% Stand to Sit: 1: +2 Total assist;To bed;With upper extremity assist Stand to Sit: Patient Percentage: 70% Details for Transfer Assistance: verbal cues for hand placement and LE management, to walk legs forward to sit down. Ambulation/Gait Ambulation/Gait Assistance: 1: +2 Total assist Ambulation/Gait: Patient Percentage: 70% Ambulation Distance (Feet): 80 Feet Assistive device: Rolling walker Ambulation/Gait Assistance Details: cues for sequnece and for posture, at times rested standing . Gait Pattern:  Step-through pattern    Exercises Total Joint Exercises Ankle Circles/Pumps: AROM;20 reps;Both Quad Sets: AROM;20 reps;Both Short Arc Quad: AAROM;Both;20 reps Heel Slides: AROM;10 reps;AAROM;Both Straight Leg Raises: AROM;AAROM;20 reps;Both Goniometric ROM: L 70, R 60   PT Diagnosis:    PT Problem List:   PT Treatment Interventions:     PT Goals Acute Rehab PT Goals Pt will go Supine/Side to Sit: with min assist;with HOB 0 degrees PT Goal: Supine/Side to Sit - Progress: Progressing toward goal Pt will go Sit to Supine/Side: with min assist PT Goal: Sit to Supine/Side - Progress: Progressing toward goal Pt will go Sit to Stand: with min assist PT Goal: Sit to Stand - Progress: Progressing toward goal Pt will Ambulate: 51 - 150 feet;with min assist;with rolling walker PT Goal: Ambulate - Progress: Progressing toward goal Pt will Perform Home Exercise Program: with supervision, verbal cues required/provided PT Goal: Perform Home Exercise Program - Progress: Goal set today  Visit Information  Last PT Received On: 07/30/12 Assistance Needed: +2    Subjective Data  Subjective: I had it bad after the epidural came out. It is better.   Cognition  Cognition Arousal/Alertness: Awake/alert Behavior During Therapy: WFL for tasks assessed/performed Overall Cognitive Status: Within Functional Limits for tasks assessed    Balance     End of Session PT - End of Session Equipment Utilized During Treatment: Right knee immobilizer;Left knee immobilizer Activity Tolerance: Patient limited by pain Patient left: with call bell/phone within reach;in bed;with family/visitor present Nurse Communication: Mobility status CPM Left Knee CPM Left Knee: On   GP     [name] 07/30/2012, 3:57 PM

## 2012-07-31 LAB — PROTIME-INR
INR: 1.18 (ref 0.00–1.49)
Prothrombin Time: 14.8 seconds (ref 11.6–15.2)

## 2012-07-31 LAB — GLUCOSE, CAPILLARY
Glucose-Capillary: 134 mg/dL — ABNORMAL HIGH (ref 70–99)
Glucose-Capillary: 146 mg/dL — ABNORMAL HIGH (ref 70–99)

## 2012-07-31 MED ORDER — WARFARIN SODIUM 10 MG PO TABS
10.0000 mg | ORAL_TABLET | Freq: Once | ORAL | Status: DC
  Administered 2012-07-31: 10 mg via ORAL
  Filled 2012-07-31: qty 1

## 2012-07-31 NOTE — Progress Notes (Signed)
   Subjective: 4 Days Post-Op Procedure(s) (LRB): TOTAL KNEE BILATERAL (Bilateral) Patient reports pain as mild.   Patient seen in rounds with Dr. Darrelyn Hillock. Patient is well, and has had no acute complaints or problems. No issues overnight. No SOB or chest pain. He reports that he is doing well. Voiding well. Sleeping ok.  Plan is to go Skilled nursing facility after hospital stay.  Objective: Vital signs in last 24 hours: Temp:  [99 F (37.2 C)-102.3 F (39.1 C)] 99.4 F (37.4 C) (05/18 0529) Pulse Rate:  [108-109] 109 (05/18 0529) Resp:  [17-20] 20 (05/18 0529) BP: (127-146)/(80-103) 146/103 mmHg (05/18 0529) SpO2:  [94 %-97 %] 95 % (05/18 0529)  Intake/Output from previous day:  Intake/Output Summary (Last 24 hours) at 07/31/12 0803 Last data filed at 07/31/12 0529  Gross per 24 hour  Intake 404.33 ml  Output   1700 ml  Net -1295.67 ml    Intake/Output this shift:    Labs:  Recent Labs  07/29/12 0410 07/30/12 0537  HGB 9.2* 8.6*    Recent Labs  07/29/12 0410 07/30/12 0537  WBC 11.9* 13.7*  RBC 3.01* 2.92*  HCT 26.9* 25.6*  PLT 208 212    Recent Labs  07/29/12 0410  NA 135  K 3.8  CL 99  CO2 31  BUN 9  CREATININE 0.90  GLUCOSE 143*  CALCIUM 8.8    Recent Labs  07/30/12 0537 07/31/12 0524  INR 1.07 1.18    EXAM General - Patient is Alert and Oriented Extremity - Neurologically intact Neurovascular intact Dorsiflexion/Plantar flexion intact No cellulitis present Compartment soft Dressing/Incision - clean, dry, no drainage Motor Function - intact, moving foot and toes well on exam.   Past Medical History  Diagnosis Date  . GERD (gastroesophageal reflux disease)   . Diabetes mellitus   . Hyperlipidemia   . Herpes   . Norovirus     1'14- no reoccurring issues    Assessment/Plan: 4 Days Post-Op Procedure(s) (LRB): TOTAL KNEE BILATERAL (Bilateral) Principal Problem:   OA (osteoarthritis) of knee Active Problems:   Postoperative  anemia due to acute blood loss  Estimated body mass index is 34.61 kg/(m^2) as calculated from the following:   Height as of this encounter: 5\' 7"  (1.702 m).   Weight as of this encounter: 100.245 kg (221 lb). Advance diet Up with therapy Discharge to SNF tomorrow  DVT Prophylaxis - Coumadin Weight-Bearing as tolerated   He is progressing well. Continue PT today. Plan for discharge to SNF tomorrow.   Adem Costlow LAUREN 07/31/2012, 8:03 AM

## 2012-07-31 NOTE — Progress Notes (Signed)
ANTICOAGULATION CONSULT NOTE - Follow Up Consult  Pharmacy Consult for warfarin Indication: VTE prophylaxis   No Known Allergies  Patient Measurements: Height: 5\' 7"  (170.2 cm) Weight: 221 lb (100.245 kg) IBW/kg (Calculated) : 66.1  Vital Signs: Temp: 99.4 F (37.4 C) (05/18 0529) Temp src: Oral (05/18 0529) BP: 146/103 mmHg (05/18 0529) Pulse Rate: 109 (05/18 0529)  Labs:  Recent Labs  07/29/12 0410 07/30/12 0537 07/31/12 0524  HGB 9.2* 8.6*  --   HCT 26.9* 25.6*  --   PLT 208 212  --   LABPROT 14.8 13.8 14.8  INR 1.18 1.07 1.18  CREATININE 0.90  --   --     Estimated Creatinine Clearance: 103.3 ml/min (by C-G formula based on Cr of 0.9).   Assessment: 80 yoM s/p bilateral TKA 5/14.  Warfarin for VTE prophylaxis started 5/15, Coumadin score = 7. Epidural discontinued 5/16 PM. Lovenox 30 mg sq q12h started 5/17 AM.    INR 1.18 today after  3 doses of Coumadin 7.5 mg to 10mg .  Not sure why the slow rise in INR. Hgb 8.6 - no bleeding. Plan for SNF Monday.  Warfarin education completed 5/16.  Patient demonstrated a good understanding of the safety and use of warfarin therapy (including drug/food interactions, s/sx's bleeding, INR monitoring).    Goal of Therapy:  INR 2-3   Plan:   Coumadin 10 mg po x 1 tonight.  Daily PT/INR  Continue Lovenox 30 mg sq q12h until INR therapeutic  Geoffry Paradise, PharmD, BCPS Pager: [phone] 9:42 AM Pharmacy #: [phone]

## 2012-07-31 NOTE — Progress Notes (Signed)
Physical Therapy Treatment Patient Details Name: Rickey Anderson MRN: [id number] DOB: 30-Aug-1955 Today's Date: 07/31/2012 Time: 8469-6295 PT Time Calculation (min): 63 min  PT Assessment / Plan / Recommendation Comments on Treatment Session  Pt able to ambulate with R KI only. Noted hgb 8.4, monitored VS closely for orthostasis. Pt  tlerating increased distance and ROM. continue to recommend CIR due to being home alone and low HGB.BP pre 151/90  Hr 103 After 200 ' 160/90 hr 124 After 2nd 200 ' 173/80 hr 130    Follow Up Recommendations  CIR     Does the patient have the potential to tolerate intense rehabilitation     Barriers to Discharge        Equipment Recommendations  None recommended by PT    Recommendations for Other Services Rehab consult  Frequency 7X/week   Plan Discharge plan remains appropriate;Frequency remains appropriate    Precautions / Restrictions Precautions Precautions: Knee Required Braces or Orthoses: Knee Immobilizer - Right Knee Immobilizer - Right: Discontinue once straight leg raise with < 10 degree lag   Pertinent Vitals/Pain    Mobility  Bed Mobility Supine to Sit: 4: Min assist;HOB elevated;Other (comment);With rails Details for Bed Mobility Assistance: pt  used trapeze and rails to get to sitting up, support of RLE as pt slides to edge of bed. Transfers Sit to Stand: 1: +2 Total assist;With upper extremity assist;From chair/3-in-1;With armrests;From bed Sit to Stand: Patient Percentage: 60% Stand to Sit: 1: +2 Total assist;With upper extremity assist;To chair/3-in-1;With armrests Stand to Sit: Patient Percentage: 60% Details for Transfer Assistance: verbal cues for hand placement and LE management, to walk legs forward to sit down. Ambulation/Gait Ambulation/Gait Assistance: 1: +2 Total assist Ambulation/Gait: Patient Percentage: 70% Ambulation Distance (Feet): 200 Feet (x2) Assistive device: Rolling walker Ambulation/Gait Assistance  Details: cues for sequence, attemt to flex L knee during initial swing. Pt stopes to rest frequently, mildly diaphoretic. no dizziness. Gait Pattern: Step-to pattern;Step-through pattern;Antalgic Gait velocity: slow General Gait Details: tolerated ambulation without L KI>    Exercises Total Joint Exercises Ankle Circles/Pumps: AROM;20 reps;Both Quad Sets: AROM;20 reps;Both Short Arc Quad: AAROM;Both;20 reps Heel Slides: AROM;10 reps;AAROM;Both Straight Leg Raises: AROM;AAROM;20 reps;Both Goniometric ROM: L is tighter today, 60 , R 70   PT Diagnosis:    PT Problem List:   PT Treatment Interventions:     PT Goals Acute Rehab PT Goals Pt will go Supine/Side to Sit: with min assist;with HOB 0 degrees PT Goal: Supine/Side to Sit - Progress: Progressing toward goal Pt will go Sit to Stand: with min assist PT Goal: Sit to Stand - Progress: Progressing toward goal Pt will go Stand to Sit: with min assist PT Goal: Stand to Sit - Progress: Progressing toward goal Pt will Ambulate: 51 - 150 feet;with min assist;with rolling walker PT Goal: Ambulate - Progress: Progressing toward goal Pt will Perform Home Exercise Program: with supervision, verbal cues required/provided PT Goal: Perform Home Exercise Program - Progress: Progressing toward goal  Visit Information  Last PT Received On: 07/31/12 Assistance Needed: +2    Subjective Data  Subjective: It feels better the more I walk,   Cognition  Cognition Arousal/Alertness: Awake/alert    Balance     End of Session PT - End of Session Equipment Utilized During Treatment: Right knee immobilizer Activity Tolerance: Patient tolerated treatment well Patient left: in chair;with call bell/phone within reach Nurse Communication: Mobility status   GP     [name] 07/31/2012, 12:21 PM  Tresa Endo PT [phone]

## 2012-07-31 NOTE — Progress Notes (Signed)
Orthopedic Tech Progress Note Patient Details:  Rickey Anderson [birth year] [id number] Patient taken out of Right leg CPM and placed in Left.  CPM Left Knee CPM Left Knee: On Left Knee Flexion (Degrees): 60 CPM Right Knee CPM Right Knee: On Right Knee Flexion (Degrees): 40 Right Knee Extension (Degrees): 10 Additional Comments: pt tol well   [name] 07/31/2012, 5:34 PM

## 2012-07-31 NOTE — Progress Notes (Signed)
Orthopedic Tech Progress Note Patient Details:  Rickey Anderson 1955/09/21 [id number] CPM applied CPM Left Knee CPM Left Knee: Off CPM Right Knee CPM Right Knee: On Right Knee Flexion (Degrees): 40 Right Knee Extension (Degrees): 10 Additional Comments: pt tol well   [place] R Thompson 07/31/2012, 3:23 PM

## 2012-07-31 NOTE — Progress Notes (Signed)
Physical Therapy Treatment Patient Details Name: Rickey Anderson MRN: [id number] DOB: [birth year] Today's Date: 07/31/2012 Time: 0865-7846 PT Time Calculation (min): 14 min  PT Assessment / Plan / Recommendation Comments on Treatment Session  continues to improve with mobility. able to stand with no KI . plans for  CIR.    Follow Up Recommendations  CIR     Does the patient have the potential to tolerate intense rehabilitation     Barriers to Discharge        Equipment Recommendations  None recommended by PT    Recommendations for Other Services Rehab consult  Frequency 7X/week   Plan Discharge plan remains appropriate;Frequency remains appropriate    Precautions / Restrictions Precautions Precautions: Knee Required Braces or Orthoses: Knee Immobilizer - Right Knee Immobilizer - Right: Discontinue once straight leg raise with < 10 degree lag   Pertinent Vitals/Pain 8 in both knees. Just got meds.    Mobility  Bed Mobility Sit to Supine: 4: Min assist Details for Bed Mobility Assistance: assistance with RLE only. able to place L onto bed. Transfers Sit to Stand: 3: Mod assist;From chair/3-in-1;With upper extremity assist Sit to Stand: Patient Percentage: 60% Stand to Sit: To bed;4: Min assist;With upper extremity assist;To elevated surface Stand to Sit: Patient Percentage: 60% Details for Transfer Assistance: verbal cues for hand placement and LE management, to walk legs forward to sit down. Ambulation/Gait Ambulation/Gait Assistance: 3: Mod assist Ambulation/Gait: Patient Percentage: 70% Ambulation Distance (Feet): 5 Feet Assistive device: Rolling walker Ambulation/Gait Assistance Details: used neither KI and was able to walk back to bed. Gait Pattern: Step-to pattern;Step-through pattern;Antalgic Gait velocity: slow General Gait Details: tolerated ambulation without L KI>    Exercises Total Joint Exercises Ankle Circles/Pumps: AROM;20 reps;Both Quad Sets:  AROM;20 reps;Both Short Arc Quad: AAROM;Both;20 reps Heel Slides: AROM;10 reps;AAROM;Both Straight Leg Raises: AROM;AAROM;20 reps;Both Goniometric ROM: L is tighter today, 60 , R 70   PT Diagnosis:    PT Problem List:   PT Treatment Interventions:     PT Goals Acute Rehab PT Goals Pt will go Supine/Side to Sit: with min assist;with HOB 0 degrees PT Goal: Supine/Side to Sit - Progress: Progressing toward goal Pt will go Sit to Supine/Side: with modified independence PT Goal: Sit to Supine/Side - Progress: Updated due to goal met Pt will go Sit to Stand: with min assist PT Goal: Sit to Stand - Progress: Progressing toward goal Pt will go Stand to Sit: with supervision PT Goal: Stand to Sit - Progress: Updated due to goals met Pt will Ambulate: 51 - 150 feet;with min assist;with rolling walker PT Goal: Ambulate - Progress: Progressing toward goal Pt will Perform Home Exercise Program: with supervision, verbal cues required/provided PT Goal: Perform Home Exercise Program - Progress: Progressing toward goal  Visit Information  Last PT Received On: 07/31/12 Assistance Needed: +1    Subjective Data  Subjective: I walked a lot so I want to get back into bed and get the machine.   Cognition  Cognition Arousal/Alertness: Awake/alert    Balance     End of Session PT - End of Session Equipment Utilized During Treatment: Right knee immobilizer Activity Tolerance: Patient tolerated treatment well Patient left: with call bell/phone within reach;in bed Nurse Communication: Mobility status CPM Right Knee CPM Right Knee: On   GP     [name] 07/31/2012, 3:39 PM

## 2012-08-01 ENCOUNTER — Inpatient Hospital Stay (HOSPITAL_COMMUNITY)
Admission: RE | Admit: 2012-08-01 | Discharge: 2012-08-05 | DRG: 945 | Disposition: A | Discharge status: Home Health | Payer: PRIVATE HEALTH INSURANCE | Source: Intra-hospital | Attending: Physical Medicine & Rehabilitation | Admitting: Physical Medicine & Rehabilitation

## 2012-08-01 ENCOUNTER — Encounter (HOSPITAL_COMMUNITY): Payer: Self-pay | Admitting: *Deleted

## 2012-08-01 DIAGNOSIS — M25473 Effusion, unspecified ankle: Secondary | ICD-10-CM | POA: Diagnosis present

## 2012-08-01 DIAGNOSIS — D62 Acute posthemorrhagic anemia: Secondary | ICD-10-CM

## 2012-08-01 DIAGNOSIS — M25476 Effusion, unspecified foot: Secondary | ICD-10-CM | POA: Diagnosis present

## 2012-08-01 DIAGNOSIS — M1710 Unilateral primary osteoarthritis, unspecified knee: Secondary | ICD-10-CM | POA: Diagnosis present

## 2012-08-01 DIAGNOSIS — S78119A Complete traumatic amputation at level between unspecified hip and knee, initial encounter: Secondary | ICD-10-CM

## 2012-08-01 DIAGNOSIS — M169 Osteoarthritis of hip, unspecified: Secondary | ICD-10-CM

## 2012-08-01 DIAGNOSIS — Z5189 Encounter for other specified aftercare: Principal | ICD-10-CM

## 2012-08-01 DIAGNOSIS — E785 Hyperlipidemia, unspecified: Secondary | ICD-10-CM | POA: Diagnosis present

## 2012-08-01 DIAGNOSIS — Z87891 Personal history of nicotine dependence: Secondary | ICD-10-CM

## 2012-08-01 DIAGNOSIS — K219 Gastro-esophageal reflux disease without esophagitis: Secondary | ICD-10-CM | POA: Diagnosis present

## 2012-08-01 DIAGNOSIS — Z96659 Presence of unspecified artificial knee joint: Secondary | ICD-10-CM

## 2012-08-01 DIAGNOSIS — E119 Type 2 diabetes mellitus without complications: Secondary | ICD-10-CM | POA: Diagnosis present

## 2012-08-01 LAB — PROTIME-INR: INR: 1.52 — ABNORMAL HIGH (ref 0.00–1.49)

## 2012-08-01 LAB — GLUCOSE, CAPILLARY
Glucose-Capillary: 161 mg/dL — ABNORMAL HIGH (ref 70–99)
Glucose-Capillary: 121 mg/dL — ABNORMAL HIGH (ref 70–99)

## 2012-08-01 MED ORDER — BISACODYL 10 MG RE SUPP: 10.0000 mg | Freq: Every day | RECTAL | Status: DC | PRN

## 2012-08-01 MED ORDER — WARFARIN SODIUM 10 MG PO TABS
10.0000 mg | ORAL_TABLET | Freq: Once | ORAL | Status: DC
  Filled 2012-08-01: qty 1

## 2012-08-01 MED ORDER — TRAMADOL HCL 50 MG PO TABS: 50.0000 mg | ORAL_TABLET | Freq: Four times a day (QID) | ORAL | Status: DC | PRN

## 2012-08-01 MED ORDER — METFORMIN HCL 500 MG PO TABS
1000.0000 mg | ORAL_TABLET | Freq: Two times a day (BID) | ORAL | Status: DC
  Administered 2012-08-01 – 2012-08-05 (×8): 1000 mg via ORAL
  Filled 2012-08-01 (×11): qty 2

## 2012-08-01 MED ORDER — GLIMEPIRIDE 2 MG PO TABS
2.0000 mg | ORAL_TABLET | Freq: Two times a day (BID) | ORAL | Status: DC
  Administered 2012-08-01 – 2012-08-05 (×8): 2 mg via ORAL
  Filled 2012-08-01 (×10): qty 1

## 2012-08-01 MED ORDER — CHLORPROMAZINE HCL 10 MG PO TABS
10.0000 mg | ORAL_TABLET | Freq: Three times a day (TID) | ORAL | Status: DC | PRN
  Administered 2012-08-01: 10 mg via ORAL
  Filled 2012-08-01: qty 1

## 2012-08-01 MED ORDER — DSS 100 MG PO CAPS: 100.0000 mg | ORAL_CAPSULE | Freq: Two times a day (BID) | ORAL | Status: DC

## 2012-08-01 MED ORDER — METHOCARBAMOL 500 MG PO TABS: 500.0000 mg | ORAL_TABLET | Freq: Four times a day (QID) | ORAL | Status: DC | PRN

## 2012-08-01 MED ORDER — GLUCERNA SHAKE PO LIQD
120.0000 mL | Freq: Four times a day (QID) | ORAL | Status: DC
  Administered 2012-08-01 – 2012-08-05 (×11): 120 mL via ORAL

## 2012-08-01 MED ORDER — POLYETHYLENE GLYCOL 3350 17 G PO PACK
17.0000 g | PACK | Freq: Every day | ORAL | Status: DC | PRN
  Filled 2012-08-01: qty 1

## 2012-08-01 MED ORDER — ENOXAPARIN SODIUM 30 MG/0.3ML ~~LOC~~ SOLN: 30.0000 mg | Freq: Two times a day (BID) | SUBCUTANEOUS | Status: DC

## 2012-08-01 MED ORDER — ENOXAPARIN SODIUM 30 MG/0.3ML ~~LOC~~ SOLN
30.0000 mg | Freq: Two times a day (BID) | SUBCUTANEOUS | Status: DC
  Administered 2012-08-01 – 2012-08-04 (×7): 30 mg via SUBCUTANEOUS
  Filled 2012-08-01 (×10): qty 0.3

## 2012-08-01 MED ORDER — INSULIN ASPART 100 UNIT/ML ~~LOC~~ SOLN
0.0000 [IU] | Freq: Three times a day (TID) | SUBCUTANEOUS | Status: DC
  Administered 2012-08-01: 3 [IU] via SUBCUTANEOUS
  Administered 2012-08-02 – 2012-08-03 (×5): 2 [IU] via SUBCUTANEOUS

## 2012-08-01 MED ORDER — SENNOSIDES-DOCUSATE SODIUM 8.6-50 MG PO TABS: 1.0000 | ORAL_TABLET | Freq: Every evening | ORAL | Status: DC | PRN

## 2012-08-01 MED ORDER — ACETAMINOPHEN 325 MG PO TABS: 650.0000 mg | ORAL_TABLET | Freq: Four times a day (QID) | ORAL | Status: DC | PRN

## 2012-08-01 MED ORDER — CHLORPROMAZINE HCL 10 MG PO TABS
10.0000 mg | ORAL_TABLET | Freq: Three times a day (TID) | ORAL | Status: DC | PRN
  Administered 2012-08-01 – 2012-08-02 (×2): 10 mg via ORAL
  Filled 2012-08-01 (×3): qty 1

## 2012-08-01 MED ORDER — TRAZODONE HCL 50 MG PO TABS
50.0000 mg | ORAL_TABLET | Freq: Every day | ORAL | Status: AC
  Administered 2012-08-01: 50 mg via ORAL
  Filled 2012-08-01: qty 1

## 2012-08-01 MED ORDER — ACETAMINOPHEN 325 MG PO TABS
325.0000 mg | ORAL_TABLET | ORAL | Status: DC | PRN
  Administered 2012-08-01 – 2012-08-05 (×13): 650 mg via ORAL
  Filled 2012-08-01 (×13): qty 2

## 2012-08-01 MED ORDER — COLCHICINE 0.6 MG PO TABS
0.6000 mg | ORAL_TABLET | Freq: Two times a day (BID) | ORAL | Status: DC
  Administered 2012-08-01 – 2012-08-02 (×2): 0.6 mg via ORAL
  Filled 2012-08-01 (×4): qty 1

## 2012-08-01 MED ORDER — POLYETHYLENE GLYCOL 3350 17 G PO PACK: 17.0000 g | PACK | Freq: Every day | ORAL | Status: DC | PRN

## 2012-08-01 MED ORDER — ONDANSETRON HCL 4 MG PO TABS: 4.0000 mg | ORAL_TABLET | Freq: Four times a day (QID) | ORAL | Status: DC | PRN

## 2012-08-01 MED ORDER — ONDANSETRON HCL 4 MG/2ML IJ SOLN: 4.0000 mg | Freq: Four times a day (QID) | INTRAMUSCULAR | Status: DC | PRN

## 2012-08-01 MED ORDER — SORBITOL 70 % SOLN: 30.0000 mL | Freq: Every day | Status: DC | PRN

## 2012-08-01 MED ORDER — LIRAGLUTIDE 18 MG/3ML ~~LOC~~ SOPN
1.2000 mg | PEN_INJECTOR | Freq: Every day | SUBCUTANEOUS | Status: DC
  Administered 2012-08-02 – 2012-08-04 (×3): 1.2 mg via SUBCUTANEOUS

## 2012-08-01 MED ORDER — OMEPRAZOLE 20 MG PO CPDR
20.0000 mg | DELAYED_RELEASE_CAPSULE | Freq: Every day | ORAL | Status: DC
  Administered 2012-08-02 – 2012-08-05 (×4): 20 mg via ORAL
  Filled 2012-08-01 (×5): qty 1

## 2012-08-01 MED ORDER — DIPHENHYDRAMINE HCL 12.5 MG/5ML PO ELIX: 12.5000 mg | ORAL_SOLUTION | ORAL | Status: DC | PRN

## 2012-08-01 MED ORDER — TRAMADOL HCL 50 MG PO TABS
50.0000 mg | ORAL_TABLET | Freq: Four times a day (QID) | ORAL | Status: DC | PRN
  Administered 2012-08-01 – 2012-08-05 (×12): 100 mg via ORAL
  Filled 2012-08-01 (×13): qty 2

## 2012-08-01 MED ORDER — HYDROMORPHONE HCL 2 MG PO TABS: 2.0000 mg | ORAL_TABLET | ORAL | Status: DC | PRN

## 2012-08-01 MED ORDER — WARFARIN SODIUM 7.5 MG PO TABS
7.5000 mg | ORAL_TABLET | Freq: Once | ORAL | Status: AC
  Administered 2012-08-01: 7.5 mg via ORAL
  Filled 2012-08-01: qty 1

## 2012-08-01 MED ORDER — HYDROMORPHONE HCL 2 MG PO TABS
2.0000 mg | ORAL_TABLET | ORAL | Status: DC | PRN
  Administered 2012-08-01 (×2): 6 mg via ORAL
  Filled 2012-08-01 (×2): qty 3

## 2012-08-01 MED ORDER — WARFARIN - PHARMACIST DOSING INPATIENT: Freq: Every day | Status: DC

## 2012-08-01 MED ORDER — LIRAGLUTIDE 18 MG/3ML ~~LOC~~ SOPN: 1.2000 mg | PEN_INJECTOR | Freq: Every day | SUBCUTANEOUS | Status: DC

## 2012-08-01 MED ORDER — ZOLPIDEM TARTRATE 5 MG PO TABS: 5.0000 mg | ORAL_TABLET | Freq: Every evening | ORAL | Status: DC | PRN

## 2012-08-01 MED ORDER — METOCLOPRAMIDE HCL 5 MG PO TABS: 5.0000 mg | ORAL_TABLET | Freq: Three times a day (TID) | ORAL | Status: DC | PRN

## 2012-08-01 MED ORDER — METHOCARBAMOL 500 MG PO TABS
500.0000 mg | ORAL_TABLET | Freq: Four times a day (QID) | ORAL | Status: DC | PRN
  Administered 2012-08-01 – 2012-08-05 (×11): 500 mg via ORAL
  Filled 2012-08-01 (×12): qty 1

## 2012-08-01 MED ORDER — LIRAGLUTIDE 18 MG/3ML ~~LOC~~ SOPN: 1.2000 mg | PEN_INJECTOR | Freq: Every morning | SUBCUTANEOUS | Status: DC

## 2012-08-01 NOTE — Progress Notes (Signed)
   Subjective: 5 Days Post-Op Procedure(s) (LRB): TOTAL KNEE BILATERAL (Bilateral) Patient reports pain as mild.   Patient seen in rounds with Dr. Lequita Halt. Patient is well, and has had no acute complaints or problems Patient is ready to go rehab today.  Objective: Vital signs in last 24 hours: Temp:  [98.7 F (37.1 C)-99.7 F (37.6 C)] 99.5 F (37.5 C) (05/19 0400) Pulse Rate:  [102-109] 102 (05/19 0400) Resp:  [18-20] 20 (05/19 0400) BP: (130-153)/(77-88) 130/77 mmHg (05/19 0400) SpO2:  [90 %-100 %] 90 % (05/19 0400)  Intake/Output from previous day:  Intake/Output Summary (Last 24 hours) at 08/01/12 2130 Last data filed at 08/01/12 0400  Gross per 24 hour  Intake   1170 ml  Output   2930 ml  Net  -1760 ml    Intake/Output this shift:    Labs:  Recent Labs  07/30/12 0537  HGB 8.6*    Recent Labs  07/30/12 0537  WBC 13.7*  RBC 2.92*  HCT 25.6*  PLT 212   No results found for this basename: NA, K, CL, CO2, BUN, CREATININE, GLUCOSE, CALCIUM,  in the last 72 hours  Recent Labs  07/31/12 0524 08/01/12 0446  INR 1.18 1.52*    EXAM: General - Patient is Alert, Appropriate and Oriented Extremity - Neurovascular intact Sensation intact distally Dorsiflexion/Plantar flexion intact No cellulitis present Incision - clean, dry, no drainage, healing Motor Function - intact, moving foot and toes well on exam.   Assessment/Plan: 5 Days Post-Op Procedure(s) (LRB): TOTAL KNEE BILATERAL (Bilateral) Procedure(s) (LRB): TOTAL KNEE BILATERAL (Bilateral) Past Medical History  Diagnosis Date  . GERD (gastroesophageal reflux disease)   . Diabetes mellitus   . Hyperlipidemia   . Herpes   . Norovirus     1'14- no reoccurring issues   Principal Problem:   OA (osteoarthritis) of knee Active Problems:   Postoperative anemia due to acute blood loss  Estimated body mass index is 34.61 kg/(m^2) as calculated from the following:   Height as of this encounter: 5'  7" (1.702 m).   Weight as of this encounter: 100.245 kg (221 lb). Up with therapy To rehab today Diet - Cardiac diet and Diabetic diet Follow up - in 2 weeks Activity - WBAT Disposition - Rehab Condition Upon Discharge - Good D/C Meds - See DC Summary DVT Prophylaxis - Coumadin  PERKINS, ALEXZANDREW 08/01/2012, 7:02 AM

## 2012-08-01 NOTE — Progress Notes (Signed)
Rehab admissions - I have approval for acute inpatient rehab admission for today from [employer].  Bed available and will admit to acute inpatient rehab today.  Call me for questions.  #[phone]

## 2012-08-01 NOTE — PMR Pre-admission (Signed)
PMR Admission Coordinator Pre-Admission Assessment  Patient: Rickey Anderson is an 57 y.o., male MRN: [id number] DOB: 1955/11/12 Height: 5\' 7"  (170.2 cm) Weight: 100.245 kg (221 lb)              Insurance Information HMO:      PPO:       PCP:       IPA:       80/20:       OTHER:  Group # [id number] PRIMARY: [employer]      Policy#: [id number]      Subscriber: Jeanelle Malling CM Name:  Any RN for update      Phone#: [phone]     Fax#:  None Pre-Cert#: [id number] 5/19 to 5/25 with update due on 5/23      Employer: [employer] Benefits:  Phone #: [phone]     Name: Gwyneth Revels. Date: 09/24/09     Deduct: $100 Ind (met)      Out of Pocket Max: $1200 Ind (met $68)      Life Max: $5 mil per year CIR: 80% w/auth      SNF: 80% w/auth  100 days Outpatient: 80%  40 visits combined     Co-Pay: 20% Home Health: 80% w/auth    Co-Pay: 20% DME: 80%     Co-Pay: 20% Providers: [facility]  Emergency Contact Information Contact Information   Name Relation Home Work Mobile   [place] Spouse [phone]     Current Medical History  Patient Admitting Diagnosis:  B TKR History of Present Illness: A 57 y.o. right-handed male admitted 07/27/2012 with end stage changes bilateral knees secondary to osteoarthritis and no relief with conservative care. Patient with noted procedures on the knees include arthroscopy and meniscectomy. Underwent bilateral total knee arthroplasty 07/27/2012 per Dr. Despina Hick. Placed on Coumadin for DVT prophylaxis and weightbearing as tolerated bilateral lower extremities. Postoperative pain management. Acute blood loss anemia with hemoglobin 9.9 and monitored. Physical and occupational therapy evaluations pending. M.D. as requested physical medicine rehabilitation consult to consider inpatient rehabilitation services.  Patient states that he was riding exercise bike prior to surgery.  Epidural and foley catheter  discontinued over weekend.  Voiding without difficulty.  Participating well with therapies and is very motivated.     Past Medical History  Past Medical History  Diagnosis Date  . GERD (gastroesophageal reflux disease)   . Diabetes mellitus   . Hyperlipidemia   . Herpes   . Norovirus     1'14- no reoccurring issues    Family History  family history includes Alcohol abuse in an unspecified family member; Depression in an unspecified family member; Heart disease in an unspecified family member; and Hypertension in an unspecified family member.  Prior Rehab/Hospitalizations: None   Current Medications  Current facility-administered medications:0.9 %  sodium chloride infusion, , Intravenous, Continuous, Alexzandrew Perkins, PA-C, Last Rate: 20 mL/hr at 07/29/12 1316;  acetaminophen (TYLENOL) suppository 650 mg, 650 mg, Rectal, Q6H PRN, Loanne Drilling, MD;  acetaminophen (TYLENOL) tablet 650 mg, 650 mg, Oral, Q6H PRN, Loanne Drilling, MD, 650 mg at 07/30/12 1437;  bisacodyl (DULCOLAX) suppository 10 mg, 10 mg, Rectal, Daily PRN, Loanne Drilling, MD chlorproMAZINE (THORAZINE) tablet 10 mg, 10 mg, Oral, TID PRN, Loanne Drilling, MD, 10 mg at 08/01/12 0659;  diphenhydrAMINE (BENADRYL) 12.5 MG/5ML elixir 12.5-25 mg, 12.5-25 mg, Oral, Q4H PRN, Loanne Drilling, MD, 25 mg at 07/31/12 2102;  docusate sodium (COLACE) capsule 100  mg, 100 mg, Oral, BID, Loanne Drilling, MD, 100 mg at 07/31/12 2003 enoxaparin (LOVENOX) injection 30 mg, 30 mg, Subcutaneous, Q12H, Alexzandrew Perkins, PA-C, 30 mg at 08/01/12 0801;  glimepiride (AMARYL) tablet 2 mg, 2 mg, Oral, BID AC, Loanne Drilling, MD, 2 mg at 08/01/12 0800;  HYDROmorphone (DILAUDID) tablet 2-6 mg, 2-6 mg, Oral, Q3H PRN, Loanne Drilling, MD, 6 mg at 08/01/12 0659;  insulin aspart (novoLOG) injection 0-15 Units, 0-15 Units, Subcutaneous, TID WC, Loanne Drilling, MD, 3 Units at 08/01/12 0800 Liraglutide SOPN 1.2 mg, 1.2 mg, Subcutaneous, Daily, Loanne Drilling, MD, 1.2 mg at 07/31/12 1448;  menthol-cetylpyridinium (CEPACOL) lozenge 3 mg, 1 lozenge, Oral, PRN, Loanne Drilling, MD;  metFORMIN (GLUCOPHAGE) tablet 1,000 mg, 1,000 mg, Oral, BID WC, Loanne Drilling, MD, 1,000 mg at 08/01/12 0800;  methocarbamol (ROBAXIN) 500 mg in dextrose 5 % 50 mL IVPB, 500 mg, Intravenous, Q6H PRN, Loanne Drilling, MD, 500 mg at 07/29/12 2137 methocarbamol (ROBAXIN) tablet 500 mg, 500 mg, Oral, Q6H PRN, Loanne Drilling, MD, 500 mg at 08/01/12 0416;  metoCLOPramide (REGLAN) injection 5-10 mg, 5-10 mg, Intravenous, Q8H PRN, Loanne Drilling, MD, 10 mg at 07/28/12 0920;  metoCLOPramide (REGLAN) tablet 5-10 mg, 5-10 mg, Oral, Q8H PRN, Loanne Drilling, MD;  morphine 2 MG/ML injection 1-2 mg, 1-2 mg, Intravenous, Q1H PRN, Loanne Drilling, MD, 2 mg at 07/31/12 2102 omeprazole (PRILOSEC) capsule 20 mg, 20 mg, Oral, Daily, Alexzandrew Perkins, PA-C, 20 mg at 07/31/12 1042;  ondansetron (ZOFRAN) injection 4 mg, 4 mg, Intravenous, Q6H PRN, Loanne Drilling, MD, 4 mg at 07/31/12 1535;  ondansetron (ZOFRAN) tablet 4 mg, 4 mg, Oral, Q6H PRN, Loanne Drilling, MD, 4 mg at 07/28/12 0602;  phenol (CHLORASEPTIC) mouth spray 1 spray, 1 spray, Mouth/Throat, PRN, Loanne Drilling, MD polyethylene glycol (MIRALAX / GLYCOLAX) packet 17 g, 17 g, Oral, Daily PRN, Loanne Drilling, MD;  traMADol Janean Sark) tablet 50-100 mg, 50-100 mg, Oral, Q6H PRN, Loanne Drilling, MD;  warfarin (COUMADIN) video, , Does not apply, Once, Loanne Drilling, MD;  Warfarin - Pharmacist Dosing Inpatient, , Does not apply, q1800, Loanne Drilling, MD;  zolpidem Remus Loffler) tablet 5 mg, 5 mg, Oral, QHS PRN, Genelle Gather Babish, PA-C, 5 mg at 07/31/12 2102  Patients Current Diet: General  Precautions / Restrictions Precautions Precautions: Knee Restrictions Weight Bearing Restrictions: No RLE Weight Bearing: Weight bearing as tolerated LLE Weight Bearing: Weight bearing as tolerated   Prior Activity Level Community (5-7x/wk):  Went out daily.  Home Assistive Devices / Equipment Home Assistive Devices/Equipment: Cane (specify quad or straight);Eyeglasses Home Adaptive Equipment: Other (comment)  Prior Functional Level Prior Function Level of Independence: Independent Able to Take Stairs?: Yes  Current Functional Level Cognition  Arousal/Alertness: Awake/alert Overall Cognitive Status: Within Functional Limits for tasks assessed Orientation Level: Oriented X4    Extremity Assessment (includes Sensation/Coordination)  RUE ROM/Strength/Tone: WFL for tasks assessed  RLE ROM/Strength/Tone: Deficits RLE ROM/Strength/Tone Deficits: ankle WFL; able to assist with SLR    ADLs  Eating/Feeding: Simulated;Independent Where Assessed - Eating/Feeding: Chair Grooming: Simulated;Wash/dry hands;Set up Where Assessed - Grooming: Supported sitting Upper Body Bathing: Simulated;Chest;Right arm;Left arm;Abdomen;Set up;Supervision/safety Where Assessed - Upper Body Bathing: Unsupported sitting Lower Body Bathing: Simulated;+2 Total assistance Lower Body Bathing: Patient Percentage: 40% Where Assessed - Lower Body Bathing: Supported sit to stand Upper Body Dressing: Simulated;Minimal assistance (IV and lines) Where Assessed - Upper Body Dressing: Unsupported sitting Lower Body Dressing:  Simulated;+2 Total assistance Lower Body Dressing: Patient Percentage: 10% Where Assessed - Lower Body Dressing: Supported sit to stand Toilet Transfer: Simulated;+2 Total assistance Toilet Transfer: Patient Percentage: 70% Toilet Transfer Method: Sit to stand;Other (comment) (walk with PT then taking steps with RW to pivot to chair) Toileting - Clothing Manipulation and Hygiene: Simulated;+2 Total assistance Toileting - Clothing Manipulation and Hygiene: Patient Percentage: 10% Where Assessed - Toileting Clothing Manipulation and Hygiene: Sit to stand from 3-in-1 or toilet Equipment Used: Rolling walker ADL Comments: Pt states pain  8/10 with activity but definitely motivated to do as much as he can. Introduced AE but hasnt practiced with it yet.    Mobility  Bed Mobility: Supine to Sit Supine to Sit: 4: Min assist;HOB elevated;Other (comment);With rails Supine to Sit: Patient Percentage: 70% Sit to Supine: 4: Min assist Sit to Supine: Patient Percentage: 70%    Transfers  Transfers: Sit to Stand;Stand to Sit Sit to Stand: 3: Mod assist;From chair/3-in-1;With upper extremity assist Sit to Stand: Patient Percentage: 60% Stand to Sit: To bed;4: Min assist;With upper extremity assist;To elevated surface Stand to Sit: Patient Percentage: 60%    Ambulation / Gait / Stairs / Wheelchair Mobility  Ambulation/Gait Ambulation/Gait Assistance: 3: Mod assist Ambulation/Gait: Patient Percentage: 70% Ambulation Distance (Feet): 5 Feet Assistive device: Rolling walker Ambulation/Gait Assistance Details: used neither KI and was able to walk back to bed. Gait Pattern: Step-to pattern;Step-through pattern;Antalgic Gait velocity: slow General Gait Details: tolerated ambulation without L KI>    Posture / Balance      Special needs/care consideration BiPAP/CPAP No CPM Yes  Continuous Drip IV No Dialysis No        Life Vest No Oxygen No Special Bed No Trach Size No Wound Vac (area) No       Skin No                              Bowel mgmt:  No documented BM since admission.  Bladder mgmt: Voiding without difficulty. Diabetic mgmt Yes, on oral medications at home.    Previous Home Environment Living Arrangements: Spouse/significant other Lives With: Spouse Available Help at Discharge:  (works days) Type of Home: House Home Layout: One level Home Access: Stairs to enter Secretary/administrator of Steps: 2 Home Care Services: No Additional Comments: has a walker, not sure if it is rolling or standard; also has toilet riser and carex stand assist  Discharge Living Setting Plans for Discharge Living Setting: Patient's  home;House;Lives with (comment) (Lives with wife.) Type of Home at Discharge: House Discharge Home Layout: One level Discharge Home Access: Stairs to enter Entrance Stairs-Number of Steps: 2 Do you have any problems obtaining your medications?: No  Social/Family/Support Systems Patient Roles: Spouse;Parent Contact Information: Timon Geissinger - wife (h) [phone] (c) [phone] Anticipated Caregiver: wife and self Ability/Limitations of Caregiver: Wife works [employer] 12 to 7 Caregiver Availability: Other (Comment) (Wife off Friday to Monday, works tues, wed, [employer] Discharge Plan Discussed with Primary Caregiver: Yes Is Caregiver In Agreement with Plan?: Yes Does Caregiver/Family have Issues with Lodging/Transportation while Pt is in Rehab?: No  Goals/Additional Needs Patient/Family Goal for Rehab: PT/OT mod I, no ST goals Expected length of stay: 7 days Cultural Considerations: Christian Dietary Needs: Regular diet, thin liquids Equipment Needs: TBD Pt/Family Agrees to Admission and willing to participate: Yes Program Orientation Provided & Reviewed with Pt/Caregiver Including Roles  & Responsibilities: Yes   Decrease burden of Care  through IP rehab admission: Not applicable  Possible need for SNF placement upon discharge: Not likely   Patient Condition: This patient's medical and functional status has changed since the consult dated: 07/28/12 in which the Rehabilitation Physician determined and documented that the patient's condition is appropriate for intensive rehabilitative care in an inpatient rehabilitation facility. See "History of Present Illness" (above) for medical update. Functional changes are: Patient currently requiring Mod Assist to ambulate 5' with RW. Patient's medical and functional status update has been discussed with the Rehabilitation physician and patient remains appropriate for inpatient rehabilitation. Will admit to inpatient rehab  today.  Preadmission Screen Completed By:  Trish Mage, 08/01/2012 9:16 AM ______________________________________________________________________   Discussed status with Dr. Riley Kill on 08/01/12 at 0930 and received telephone approval for admission today.  Admission Coordinator:  Trish Mage, time0937/Date05/19/14

## 2012-08-01 NOTE — Progress Notes (Signed)
Pt to d/c to Inpatient Rehab at [facility], room 4142. Report given to nurse.  No signs and symptoms of distress.

## 2012-08-01 NOTE — Progress Notes (Signed)
ANTICOAGULATION CONSULT NOTE - Follow Up Consult  Pharmacy Consult for warfarin Indication: VTE prophylaxis   No Known Allergies  Patient Measurements: Height: 5\' 7"  (170.2 cm) Weight: 221 lb (100.245 kg) IBW/kg (Calculated) : 66.1  Vital Signs: Temp: 99.5 F (37.5 C) (05/19 0400) Temp src: Oral (05/19 0400) BP: 130/77 mmHg (05/19 0400) Pulse Rate: 102 (05/19 0400)  Labs:  Recent Labs  07/30/12 0537 07/31/12 0524 08/01/12 0446  HGB 8.6*  --   --   HCT 25.6*  --   --   PLT 212  --   --   LABPROT 13.8 14.8 17.9*  INR 1.07 1.18 1.52*    Estimated Creatinine Clearance: 103.3 ml/min (by C-G formula based on Cr of 0.9).  Warfarin doses administered 5/15 -18:  7.5, 7.5, 10, 10 mg  Assessment: 56 yoM s/p bilateral TKA 5/14.  Warfarin for VTE prophylaxis started 5/15, Coumadin score = 7. Epidural discontinued 5/16 PM. Lovenox 30 mg sq q12h started 5/17 AM.    INR responding after increase in warfarin dosage  Plans for SNF noted.  No unexpected bleeding reported in recent chart notes.   Goal of Therapy:  INR 2-3   Recommend:   Coumadin 7.5 mg po daily at 6pm.  Continue Lovenox 30 mg sq q12h until INR 2.0 or above.  Suggest rechecking INR again tomorrow, then at least 3x per week until therapeutic and stable.  Would then reduce frequency of monitoring to 2x per week.  Elie Goody, PharmD, BCPS Pager: [phone] 08/01/2012  5:47 AM

## 2012-08-01 NOTE — H&P (Signed)
Physical Medicine and Rehabilitation Admission H&P      CC: Bilateral Knee pain : HPI: Rickey Anderson is a 57 y.o. right-handed male admitted 07/27/2012 with end stage changes bilateral knees secondary to osteoarthritis and no relief with conservative care. Patient with noted procedures on the knees include arthroscopy and meniscectomy. Underwent bilateral total knee arthroplasty 07/27/2012 per Dr. Despina Hick. Placed on Coumadin for DVT prophylaxis and weightbearing as tolerated bilateral lower extremities. Postoperative pain management. Acute blood loss anemia with hemoglobin 8.6 and monitored. Physical and occupational therapy evaluations completed an ongoing. M.D. as requested physical medicine rehabilitation consult to consider inpatient rehabilitation services. Patient was felt to be a good candidate for inpatient rehabilitation services was admitted for comprehensive rehabilitation program   Review of Systems   Gastrointestinal:   Reflux   Musculoskeletal: Positive for myalgias and joint pain.   Neurological: Negative for weakness.   All other systems reviewed and are negative      Past Medical History   Diagnosis  Date   .  GERD (gastroesophageal reflux disease)     .  Diabetes mellitus     .  Hyperlipidemia     .  Herpes     .  Norovirus         1'14- no reoccurring issues    Past Surgical History   Procedure  Laterality  Date   .  Knee surgery           both knees-scopes   .  Tonsillectomy       .  Functional endoscopic sinus surgery           Deviated septum repair 10'13   .  Vasectomy       .  Eardrum           hx. perforated ear drum surgery right   .  Total knee arthroplasty  Bilateral  07/27/2012       Procedure: TOTAL KNEE BILATERAL;  Surgeon: Loanne Drilling, MD;  Location: [facility];  Service: Orthopedics;  Laterality: Bilateral;    Family History   Problem  Relation  Age of Onset   .  Hypertension       .  Heart disease       .  Alcohol abuse       .   Depression        Social History: reports that he quit smoking about 15 years ago. He does not have any smokeless tobacco history on file. He reports that he does not drink alcohol or use illicit drugs. Allergies: No Known Allergies Medications Prior to Admission   Medication  Sig  Dispense  Refill   .  glimepiride (AMARYL) 2 MG tablet  Take 1 tablet (2 mg total) by mouth 2 (two) times daily.   180 tablet   3   .  Liraglutide (VICTOZA) 18 MG/3ML SOLN injection  Inject 0.2 mLs (1.2 mg total) into the skin daily.   36 mL   3   .  metFORMIN (GLUCOPHAGE) 1000 MG tablet  Take 1 tablet (1,000 mg total) by mouth 2 (two) times daily with a meal.   60 tablet   11   .  omeprazole (PRILOSEC) 20 MG capsule  Take 20 mg by mouth daily.           .  polycarbophil (FIBERCON) 625 MG tablet  Take 625 mg by mouth daily.         .  [DISCONTINUED] Multiple Vitamin (  MULTIVITAMIN WITH MINERALS) TABS  Take 1 tablet by mouth daily.            Home: Home Living Lives With: Spouse Available Help at Discharge:  (works days) Type of Home: House Home Access: Stairs to enter Secretary/administrator of Steps: 2 Home Layout: One level Home Adaptive Equipment: Other (comment) Additional Comments: has a walker, not sure if it is rolling or standard; also has toilet riser and carex stand assist    Functional History: Prior Function Able to Take Stairs?: Yes   Functional Status:   Mobility: Bed Mobility Bed Mobility: Supine to Sit Supine to Sit: 4: Min assist;HOB elevated;Other (comment);With rails Supine to Sit: Patient Percentage: 70% Sit to Supine: 4: Min assist Sit to Supine: Patient Percentage: 70% Transfers Transfers: Sit to Stand;Stand to Sit Sit to Stand: 3: Mod assist;From chair/3-in-1;With upper extremity assist Sit to Stand: Patient Percentage: 60% Stand to Sit: To bed;4: Min assist;With upper extremity assist;To elevated surface Stand to Sit: Patient Percentage:  60% Ambulation/Gait Ambulation/Gait Assistance: 3: Mod assist Ambulation/Gait: Patient Percentage: 70% Ambulation Distance (Feet): 5 Feet Assistive device: Rolling walker Ambulation/Gait Assistance Details: used neither KI and was able to walk back to bed. Gait Pattern: Step-to pattern;Step-through pattern;Antalgic Gait velocity: slow General Gait Details: tolerated ambulation without L KI>   ADL: ADL Eating/Feeding: Simulated;Independent Where Assessed - Eating/Feeding: Chair Grooming: Simulated;Wash/dry hands;Set up Where Assessed - Grooming: Supported sitting Upper Body Bathing: Simulated;Chest;Right arm;Left arm;Abdomen;Set up;Supervision/safety Where Assessed - Upper Body Bathing: Unsupported sitting Lower Body Bathing: Simulated;+2 Total assistance Where Assessed - Lower Body Bathing: Supported sit to stand Upper Body Dressing: Simulated;Minimal assistance (IV and lines) Where Assessed - Upper Body Dressing: Unsupported sitting Lower Body Dressing: Simulated;+2 Total assistance Where Assessed - Lower Body Dressing: Supported sit to stand Toilet Transfer: Simulated;+2 Total assistance Toilet Transfer Method: Sit to stand;Other (comment) (walk with PT then taking steps with RW to pivot to chair) Equipment Used: Rolling walker ADL Comments: Pt states pain 8/10 with activity but definitely motivated to do as much as he can. Introduced AE but hasnt practiced with it yet.   Cognition: Cognition Overall Cognitive Status: Within Functional Limits for tasks assessed Arousal/Alertness: Awake/alert Orientation Level: Oriented X4 Cognition Arousal/Alertness: Awake/alert Behavior During Therapy: WFL for tasks assessed/performed Overall Cognitive Status: Within Functional Limits for tasks assessed   Physical Exam: Blood pressure 130/77, pulse 102, temperature 99.5 F (37.5 C), temperature source Oral, resp. rate 20, height 5\' 7"  (1.702 m), weight 100.245 kg (221 lb), SpO2  90.00%. Physical Exam  Vitals reviewed.   Constitutional: He is oriented to person, place, and time.   HENT:   Head: Normocephalic.   Eyes: EOM are normal.   Neck: Neck supple. No thyromegaly present.   Cardiovascular: Normal rate and regular rhythm.   Pulmonary/Chest: Effort normal and breath sounds normal. No respiratory distress.   Abdominal: Soft. Bowel sounds are normal. He exhibits no distension.  Neurological: He is alert and oriented to person, place, and time.   Skin:  Bilateral knee incisions are dressed. Mild erythema around the left knee incision Psychiatric: He has a normal mood and affect.   motor strength is 5/5 in bilateral deltoid, biceps, triceps, grip 3 minus right hip flexion 1/5 left hip flexion and knee extension.   ankle dorsiflexion plantar flexion 4/5 bilateral   Sensation intact to light touch bilaterally Musc: tenderness with palpation over the Tarsal-MT region, medial greater than latera, right more than left. Skin slightly warm, 1+ edema right foot,  trace left foot      Results for orders placed during the hospital encounter of 07/27/12 (from the past 48 hour(s))   GLUCOSE, CAPILLARY     Status: Abnormal     Collection Time      07/30/12  7:46 AM       Result  Value  Range     Glucose-Capillary  173 (*)  70 - 99 mg/dL     Comment 1  Documented in Chart        Comment 2  Notify RN      GLUCOSE, CAPILLARY     Status: Abnormal     Collection Time      07/30/12 12:00 PM       Result  Value  Range     Glucose-Capillary  161 (*)  70 - 99 mg/dL     Comment 1  Documented in Chart        Comment 2  Notify RN      GLUCOSE, CAPILLARY     Status: Abnormal     Collection Time      07/30/12  4:51 PM       Result  Value  Range     Glucose-Capillary  134 (*)  70 - 99 mg/dL     Comment 1  Documented in Chart        Comment 2  Notify RN      GLUCOSE, CAPILLARY     Status: Abnormal     Collection Time      07/30/12  9:31 PM       Result  Value  Range      Glucose-Capillary  117 (*)  70 - 99 mg/dL     Comment 1  Notify RN      PROTIME-INR     Status: None     Collection Time      07/31/12  5:24 AM       Result  Value  Range     Prothrombin Time  14.8   11.6 - 15.2 seconds     INR  1.18   0.00 - 1.49   GLUCOSE, CAPILLARY     Status: None     Collection Time      07/31/12  8:11 AM       Result  Value  Range     Glucose-Capillary  86   70 - 99 mg/dL     Comment 1  Notify RN        Comment 2  Documented in Chart      GLUCOSE, CAPILLARY     Status: Abnormal     Collection Time      07/31/12 12:14 PM       Result  Value  Range     Glucose-Capillary  134 (*)  70 - 99 mg/dL     Comment 1  Notify RN        Comment 2  Documented in Chart      GLUCOSE, CAPILLARY     Status: Abnormal     Collection Time      07/31/12  5:15 PM       Result  Value  Range     Glucose-Capillary  146 (*)  70 - 99 mg/dL     Comment 1  Notify RN      GLUCOSE, CAPILLARY     Status: Abnormal     Collection Time      07/31/12  9:35 PM  Result  Value  Range     Glucose-Capillary  132 (*)  70 - 99 mg/dL     Comment 1  Notify RN        Comment 2  Documented in Chart      PROTIME-INR     Status: Abnormal     Collection Time      08/01/12  4:46 AM       Result  Value  Range     Prothrombin Time  17.9 (*)  11.6 - 15.2 seconds     INR  1.52 (*)  0.00 - 1.49   GLUCOSE, CAPILLARY     Status: Abnormal     Collection Time      08/01/12  7:12 AM       Result  Value  Range     Glucose-Capillary  152 (*)  70 - 99 mg/dL    No results found.   Post Admission Physician Evaluation: Functional deficits secondary  to OA of the bilateral knees s/p bilateral TKA's. Patient is admitted to receive collaborative, interdisciplinary care between the physiatrist, rehab nursing staff, and therapy team. Patient's level of medical complexity and substantial therapy needs in context of that medical necessity cannot be provided at a lesser intensity of care such as a SNF. Patient  has experienced substantial functional loss from his/her baseline which was documented above under the "Functional History" and "Functional Status" headings.  Judging by the patient's diagnosis, physical exam, and functional history, the patient has potential for functional progress which will result in measurable gains while on inpatient rehab.  These gains will be of substantial and practical use upon discharge  in facilitating mobility and self-care at the household level. Physiatrist will provide 24 hour management of medical needs as well as oversight of the therapy plan/treatment and provide guidance as appropriate regarding the interaction of the two. 24 hour rehab nursing will assist with bladder management, bowel management, safety, skin/wound care, disease management, medication administration, pain management and patient education  and help integrate therapy concepts, techniques,education, etc. PT will assess and treat for/with: Lower extremity strength, range of motion, stamina, balance, functional mobility, safety, adaptive techniques and equipment, knee stability, pain mgt.   Goals are: mod I. OT will assess and treat for/with: ADL's, functional mobility, safety, upper extremity strength, adaptive techniques and equipment, pain mgt, education.   Goals are: mod I. SLP will assess and treat for/with: n/a.  Goals are: n/a. Case Management and Social Worker will assess and treat for psychological issues and discharge planning. Team conference will be held weekly to assess progress toward goals and to determine barriers to discharge. Patient will receive at least 3 hours of therapy per day at least 5 days per week. ELOS: one week      Prognosis:  excellent     Medical Problem List and Plan: 1. Bilateral TKA secondary to end-stage osteoarthritis 07/27/2012 2. DVT Prophylaxis/Anticoagulation: Coumadin for DVT prophylaxis. Check Doppler studies. Latest INR 1.52. Continue Lovenox until INR  greater than 2.00 3. Pain Management: Ultram 50-100 mg every 6 hours as needed and Robaxin. Monitor with increased mobility 4. Neuropsych: This patient is capable of making decisions on his own behalf. 5. Acute blood loss anemia. Latest hemoglobin 8.6. Followup CBC 6. Non-insulin-dependent diabetes mellitus. Latest hemoglobin A1c of 7.6. Amaryl 2 mg twice a day,Victoza 1.2 mg subcutaneous daily, Glucophage 1000 mg twice a day. Check blood sugars a.c. and at bedtime. Adjust regimen as needed 7. GERD. Prilosec 8.  Probable gout flare in right>left tarsal/MT region.   -initiate colchicine  -consider prednisone as well.  Ranelle Oyster, MD, [place] [facility]    08/01/2012

## 2012-08-01 NOTE — Progress Notes (Signed)
Pt admitted to rehab unit at 1310 via stretcher from [facility]. Family at bedside. Reviewed rehab process and discussed safety plan with pt signing sheet and has verbal understanding.  Call bell in reach and pt resting.

## 2012-08-01 NOTE — Discharge Summary (Signed)
Physician Discharge Summary   Patient ID: Rickey Anderson MRN: [id number] DOB/AGE: [birth year] 57 y.o.  Admit date: 07/27/2012 Discharge date: 08/01/2012  Primary Diagnosis:  Osteoarthritis Bilateral knee(s)  Admission Diagnoses:  Past Medical History  Diagnosis Date  . GERD (gastroesophageal reflux disease)   . Diabetes mellitus   . Hyperlipidemia   . Herpes   . Norovirus     1'14- no reoccurring issues   Discharge Diagnoses:   Principal Problem:   OA (osteoarthritis) of knee Active Problems:   Postoperative anemia due to acute blood loss  Estimated body mass index is 34.61 kg/(m^2) as calculated from the following:   Height as of this encounter: 5\' 7"  (1.702 m).   Weight as of this encounter: 100.245 kg (221 lb).  Procedure:  Procedure(s) (LRB): TOTAL KNEE BILATERAL (Bilateral)   Consults: [facility]  HPI: Rickey Anderson is a 57 y.o. year old male with end stage OA of both knees with progressively worsening pain and dysfunction. He has constant pain, with activity and at rest and significant functional deficits with difficulties even with ADLs. He has had extensive non-op management including analgesics, injections of cortisone and viscosupplements, and home exercise program, but remains in significant pain with significant dysfunction.Radiographs show bone on bone arthritis medial and patellofemoral compartments both knees. We discussed doing both knees at the same setting vs. one knee at a time including procedure, risks, potential complications and rehab course associated with each and he elected for bilateral TKA. He presents now for bilateralTotal Knee Arthroplasty.   Laboratory Data: Admission on 07/27/2012  Component Date Value Range Status  . Glucose-Capillary 07/27/2012 114* 70 - 99 mg/dL Final  . Comment 1 95/62/1308 Documented in Chart   Final  . Glucose-Capillary 07/27/2012 79  70 - 99 mg/dL Final  . Comment 1 65/78/4696 Documented in Chart    Final  . WBC 07/28/2012 8.7  4.0 - 10.5 K/uL Final  . RBC 07/28/2012 3.34* 4.22 - 5.81 MIL/uL Final  . Hemoglobin 07/28/2012 9.9* 13.0 - 17.0 g/dL Final  . HCT 29/52/8413 29.8* 39.0 - 52.0 % Final  . MCV 07/28/2012 89.2  78.0 - 100.0 fL Final  . MCH 07/28/2012 29.6  26.0 - 34.0 pg Final  . MCHC 07/28/2012 33.2  30.0 - 36.0 g/dL Final  . RDW 24/40/1027 13.9  11.5 - 15.5 % Final  . Platelets 07/28/2012 228  150 - 400 K/uL Final  . Sodium 07/28/2012 136  135 - 145 mEq/L Final  . Potassium 07/28/2012 4.1  3.5 - 5.1 mEq/L Final  . Chloride 07/28/2012 100  96 - 112 mEq/L Final  . CO2 07/28/2012 29  19 - 32 mEq/L Final  . Glucose, Bld 07/28/2012 187* 70 - 99 mg/dL Final  . BUN 25/36/6440 12  6 - 23 mg/dL Final  . Creatinine, Ser 07/28/2012 0.94  0.50 - 1.35 mg/dL Final  . Calcium 34/74/2595 8.4  8.4 - 10.5 mg/dL Final  . GFR calc non Af Amer 07/28/2012 >90  >90 mL/min Final  . GFR calc Af Amer 07/28/2012 >90  >90 mL/min Final   Comment:                                 The eGFR has been calculated                          using the CKD EPI  equation.                          This calculation has not been                          validated in all clinical                          situations.                          eGFR's persistently                          <90 mL/min signify                          possible Chronic Kidney Disease.  Marland Kitchen Prothrombin Time 07/28/2012 13.3  11.6 - 15.2 seconds Final  . INR 07/28/2012 1.02  0.00 - 1.49 Final  . Glucose-Capillary 07/27/2012 114* 70 - 99 mg/dL Final  . Glucose-Capillary 07/28/2012 169* 70 - 99 mg/dL Final  . Comment 1 21/30/8657 Documented in Chart   Final  . Comment 2 07/28/2012 Notify RN   Final  . Glucose-Capillary 07/28/2012 143* 70 - 99 mg/dL Final  . WBC 84/69/6295 11.9* 4.0 - 10.5 K/uL Final  . RBC 07/29/2012 3.01* 4.22 - 5.81 MIL/uL Final  . Hemoglobin 07/29/2012 9.2* 13.0 - 17.0 g/dL Final  . HCT 28/41/3244 26.9* 39.0 - 52.0 % Final    . MCV 07/29/2012 89.4  78.0 - 100.0 fL Final  . MCH 07/29/2012 30.6  26.0 - 34.0 pg Final  . MCHC 07/29/2012 34.2  30.0 - 36.0 g/dL Final  . RDW 03/18/7251 13.8  11.5 - 15.5 % Final  . Platelets 07/29/2012 208  150 - 400 K/uL Final  . Sodium 07/29/2012 135  135 - 145 mEq/L Final  . Potassium 07/29/2012 3.8  3.5 - 5.1 mEq/L Final  . Chloride 07/29/2012 99  96 - 112 mEq/L Final  . CO2 07/29/2012 31  19 - 32 mEq/L Final  . Glucose, Bld 07/29/2012 143* 70 - 99 mg/dL Final  . BUN 66/44/0347 9  6 - 23 mg/dL Final  . Creatinine, Ser 07/29/2012 0.90  0.50 - 1.35 mg/dL Final  . Calcium 42/59/5638 8.8  8.4 - 10.5 mg/dL Final  . GFR calc non Af Amer 07/29/2012 >90  >90 mL/min Final  . GFR calc Af Amer 07/29/2012 >90  >90 mL/min Final   Comment:                                 The eGFR has been calculated                          using the CKD EPI equation.                          This calculation has not been                          validated in all clinical  situations.                          eGFR's persistently                          <90 mL/min signify                          possible Chronic Kidney Disease.  Marland Kitchen Prothrombin Time 07/29/2012 14.8  11.6 - 15.2 seconds Final  . INR 07/29/2012 1.18  0.00 - 1.49 Final  . Glucose-Capillary 07/28/2012 174* 70 - 99 mg/dL Final  . Glucose-Capillary 07/28/2012 154* 70 - 99 mg/dL Final  . Comment 1 40/98/1191 Notify RN   Final  . Glucose-Capillary 07/29/2012 183* 70 - 99 mg/dL Final  . Comment 1 47/82/9562 Notify RN   Final  . Glucose-Capillary 07/29/2012 236* 70 - 99 mg/dL Final  . Comment 1 13/10/6576 Notify RN   Final  . Glucose-Capillary 07/29/2012 114* 70 - 99 mg/dL Final  . Comment 1 46/96/2952 Notify RN   Final  . WBC 07/30/2012 13.7* 4.0 - 10.5 K/uL Final  . RBC 07/30/2012 2.92* 4.22 - 5.81 MIL/uL Final  . Hemoglobin 07/30/2012 8.6* 13.0 - 17.0 g/dL Final  . HCT 84/13/2440 25.6* 39.0 - 52.0 % Final  . MCV  07/30/2012 87.7  78.0 - 100.0 fL Final  . MCH 07/30/2012 29.5  26.0 - 34.0 pg Final  . MCHC 07/30/2012 33.6  30.0 - 36.0 g/dL Final  . RDW 01/10/2535 13.5  11.5 - 15.5 % Final  . Platelets 07/30/2012 212  150 - 400 K/uL Final  . Prothrombin Time 07/30/2012 13.8  11.6 - 15.2 seconds Final  . INR 07/30/2012 1.07  0.00 - 1.49 Final  . Glucose-Capillary 07/29/2012 176* 70 - 99 mg/dL Final  . Comment 1 64/40/3474 Notify RN   Final  . Glucose-Capillary 07/30/2012 173* 70 - 99 mg/dL Final  . Comment 1 25/95/6387 Documented in Chart   Final  . Comment 2 07/30/2012 Notify RN   Final  . Glucose-Capillary 07/30/2012 161* 70 - 99 mg/dL Final  . Comment 1 56/43/3295 Documented in Chart   Final  . Comment 2 07/30/2012 Notify RN   Final  . Prothrombin Time 07/31/2012 14.8  11.6 - 15.2 seconds Final  . INR 07/31/2012 1.18  0.00 - 1.49 Final  . Glucose-Capillary 07/30/2012 134* 70 - 99 mg/dL Final  . Comment 1 18/84/1660 Documented in Chart   Final  . Comment 2 07/30/2012 Notify RN   Final  . Glucose-Capillary 07/30/2012 117* 70 - 99 mg/dL Final  . Comment 1 63/03/6008 Notify RN   Final  . Glucose-Capillary 07/31/2012 86  70 - 99 mg/dL Final  . Comment 1 93/23/5573 Notify RN   Final  . Comment 2 07/31/2012 Documented in Chart   Final  . Glucose-Capillary 07/31/2012 134* 70 - 99 mg/dL Final  . Comment 1 22/04/5425 Notify RN   Final  . Comment 2 07/31/2012 Documented in Chart   Final  . Prothrombin Time 08/01/2012 17.9* 11.6 - 15.2 seconds Final  . INR 08/01/2012 1.52* 0.00 - 1.49 Final  . Glucose-Capillary 07/31/2012 146* 70 - 99 mg/dL Final  . Comment 1 09/06/7626 Notify RN   Final  . Glucose-Capillary 07/31/2012 132* 70 - 99 mg/dL Final  . Comment 1 31/51/7616 Notify RN   Final  . Comment 2 07/31/2012 Documented in  Chart   Final  . Glucose-Capillary 08/01/2012 152* 70 - 99 mg/dL Final  Hospital Outpatient Visit on 07/18/2012  Component Date Value Range Status  . aPTT 07/18/2012 32  24 - 37  seconds Final  . WBC 07/18/2012 10.6* 4.0 - 10.5 K/uL Final  . RBC 07/18/2012 4.81  4.22 - 5.81 MIL/uL Final  . Hemoglobin 07/18/2012 14.4  13.0 - 17.0 g/dL Final  . HCT 40/98/1191 42.8  39.0 - 52.0 % Final  . MCV 07/18/2012 89.0  78.0 - 100.0 fL Final  . MCH 07/18/2012 29.9  26.0 - 34.0 pg Final  . MCHC 07/18/2012 33.6  30.0 - 36.0 g/dL Final  . RDW 47/82/9562 14.0  11.5 - 15.5 % Final  . Platelets 07/18/2012 360  150 - 400 K/uL Final  . Sodium 07/18/2012 136  135 - 145 mEq/L Final  . Potassium 07/18/2012 4.9  3.5 - 5.1 mEq/L Final   Comment: HEMOLYZED SPECIMEN, RESULTS MAY BE AFFECTED                          HEMOLYSIS AT THIS LEVEL MAY AFFECT RESULT  . Chloride 07/18/2012 100  96 - 112 mEq/L Final  . CO2 07/18/2012 26  19 - 32 mEq/L Final  . Glucose, Bld 07/18/2012 98  70 - 99 mg/dL Final  . BUN 13/10/6576 16  6 - 23 mg/dL Final  . Creatinine, Ser 07/18/2012 0.76  0.50 - 1.35 mg/dL Final  . Calcium 46/96/2952 10.0  8.4 - 10.5 mg/dL Final  . Total Protein 07/18/2012 7.6  6.0 - 8.3 g/dL Final  . Albumin 84/13/2440 3.9  3.5 - 5.2 g/dL Final  . AST 01/10/2535 44* 0 - 37 U/L Final  . ALT 07/18/2012 47  0 - 53 U/L Final  . Alkaline Phosphatase 07/18/2012 66  39 - 117 U/L Final  . Total Bilirubin 07/18/2012 0.4  0.3 - 1.2 mg/dL Final  . GFR calc non Af Amer 07/18/2012 >90  >90 mL/min Final  . GFR calc Af Amer 07/18/2012 >90  >90 mL/min Final   Comment:                                 The eGFR has been calculated                          using the CKD EPI equation.                          This calculation has not been                          validated in all clinical                          situations.                          eGFR's persistently                          <90 mL/min signify                          possible Chronic Kidney Disease.  Marland Kitchen  Prothrombin Time 07/18/2012 11.9  11.6 - 15.2 seconds Final  . INR 07/18/2012 0.88  0.00 - 1.49 Final  . Color, Urine 07/18/2012  YELLOW  YELLOW Final  . APPearance 07/18/2012 CLEAR  CLEAR Final  . Specific Gravity, Urine 07/18/2012 1.031* 1.005 - 1.030 Final  . pH 07/18/2012 5.0  5.0 - 8.0 Final  . Glucose, UA 07/18/2012 NEGATIVE  NEGATIVE mg/dL Final  . Hgb urine dipstick 07/18/2012 NEGATIVE  NEGATIVE Final  . Bilirubin Urine 07/18/2012 NEGATIVE  NEGATIVE Final  . Ketones, ur 07/18/2012 NEGATIVE  NEGATIVE mg/dL Final  . Protein, ur 16/12/9602 NEGATIVE  NEGATIVE mg/dL Final  . Urobilinogen, UA 07/18/2012 0.2  0.0 - 1.0 mg/dL Final  . Nitrite 54/11/8117 NEGATIVE  NEGATIVE Final  . Leukocytes, UA 07/18/2012 NEGATIVE  NEGATIVE Final   MICROSCOPIC NOT DONE ON URINES WITH NEGATIVE PROTEIN, BLOOD, LEUKOCYTES, NITRITE, OR GLUCOSE <1000 mg/dL.  . ABO/RH(D) 07/18/2012 A POS   Final  . Antibody Screen 07/18/2012 NEG   Final  . Sample Expiration 07/18/2012 07/30/2012   Final  . MRSA, PCR 07/18/2012 NEGATIVE  NEGATIVE Final  . Staphylococcus aureus 07/18/2012 POSITIVE* NEGATIVE Final   Comment:                                 The Xpert SA Assay (FDA                          approved for NASAL specimens                          in patients over 52 years of age),                          is one component of                          a comprehensive surveillance                          program.  Test performance has                          been validated by [employer] for patients greater                          than or equal to 11 year old.                          It is not intended                          to diagnose infection nor to                          guide or monitor treatment.  . ABO/RH(D) 07/18/2012 A POS   Final     X-Rays:No results found.  EKG: Orders placed in visit on 01/11/12  . EKG 12-LEAD  . EKG 12-LEAD  Hospital Course: Patient was admitted to [facility] and taken to the OR and underwent the above stated procedure well without complications.  Patient  tolerated the procedure well and was later transferred to the recovery room and then to the orthopaedic floor for postoperative care. Anesthesia was consulted postoperatively to place an epidural in for postoperative pain management. The patient was also given PO and IV analgesics for pain control following their surgery.  They were given 24 hours of postoperative antibiotics and started on DVT prophylaxis in the form of Lovenox and Coumadin after the epidural had been removed.   PT and OT were ordered for total joint protocol.  Discharge planning consulted to help with postop disposition and equipment needs.  [facility] was consulted.  Patient had a good night on the evening of surgery and started to get up OOB with therapy on day one.  Hemovac drains were pulled without difficulty on day one.  Continued to work with therapy into day two.  He had a fair amount of pain the night before and had the epidural increased with good relief.  Dressings were changed on day two and both incisions were healing well with no signs of infection.  The epidural was removed without difficulty by Anesthesia on day two.  By day three, the patient started to show progress with therapy and the foley was removed at that time.  They continued to receive therapy each day for continued total knee protocol.  The incisions were healing well.  They continued to progress on day four and day five at which time the patient was seen in rounds and was ready to go to the rehab facility.  Awaiting final approval at the time of this summary.    Discharge Medications: Prior to Admission medications   Medication Sig Start Date End Date Taking? Authorizing Provider  glimepiride (AMARYL) 2 MG tablet Take 1 tablet (2 mg total) by mouth 2 (two) times daily. 04/26/12  Yes Stacie Glaze, MD  Liraglutide (VICTOZA) 18 MG/3ML SOLN injection Inject 0.2 mLs (1.2 mg total) into the skin daily. 07/04/12  Yes Stacie Glaze, MD  metFORMIN  (GLUCOPHAGE) 1000 MG tablet Take 1 tablet (1,000 mg total) by mouth 2 (two) times daily with a meal. 04/26/12  Yes Stacie Glaze, MD  omeprazole (PRILOSEC) 20 MG capsule Take 20 mg by mouth daily.     Yes Historical Provider, MD  polycarbophil (FIBERCON) 625 MG tablet Take 625 mg by mouth daily.   Yes Historical Provider, MD  acetaminophen (TYLENOL) 325 MG tablet Take 2 tablets (650 mg total) by mouth every 6 (six) hours as needed. 08/01/12   Cordelro Gautreau Julien Girt, PA-C  bisacodyl (DULCOLAX) 10 MG suppository Place 1 suppository (10 mg total) rectally daily as needed. 08/01/12   Laretta Pyatt Julien Girt, PA-C  diphenhydrAMINE (BENADRYL) 12.5 MG/5ML elixir Take 5-10 mLs (12.5-25 mg total) by mouth every 4 (four) hours as needed for itching. 08/01/12   Aren Pryde, PA-C  docusate sodium 100 MG CAPS Take 100 mg by mouth 2 (two) times daily. 08/01/12   Rosa Wyly, PA-C  enoxaparin (LOVENOX) 30 MG/0.3ML injection Inject 0.3 mLs (30 mg total) into the skin every 12 (twelve) hours. Continue Lovenox injections until the INR is therapeutic at or greater than 2.0.  When INR reaches the therapeutic level of equal to or greater than 2.0, the patient may discontinue the Lovenox injections. 08/01/12   Sol Odor, PA-C  HYDROmorphone (DILAUDID) 2 MG tablet Take 1-3 tablets (  2-6 mg total) by mouth every 3 (three) hours as needed. 08/01/12   Brandan Glauber Julien Girt, PA-C  methocarbamol (ROBAXIN) 500 MG tablet Take 1 tablet (500 mg total) by mouth every 6 (six) hours as needed. 08/01/12   Else Habermann Julien Girt, PA-C  metoCLOPramide (REGLAN) 5 MG tablet Take 1-2 tablets (5-10 mg total) by mouth every 8 (eight) hours as needed (if ondansetron (ZOFRAN) ineffective.). 08/01/12   Silvanna Ohmer, PA-C  ondansetron (ZOFRAN) 4 MG tablet Take 1 tablet (4 mg total) by mouth every 6 (six) hours as needed for nausea. 08/01/12   Noemi Ishmael, PA-C  polyethylene glycol (MIRALAX / GLYCOLAX) packet Take 17 g by mouth  daily as needed. 08/01/12   Dawne Casali, PA-C  traMADol (ULTRAM) 50 MG tablet Take 1-2 tablets (50-100 mg total) by mouth every 6 (six) hours as needed. 08/01/12   Aksel Bencomo, PA-C  zolpidem (AMBIEN) 5 MG tablet Take 1 tablet (5 mg total) by mouth at bedtime as needed for sleep. 08/01/12   Narelle Schoening Julien Girt, PA-C    Take Coumadin for 4 weeks and then discontinue.  The dose may need to be adjusted based upon the INR.  Please follow the INR and titrate Coumadin dose for a therapeutic range between 2.0 and 3.0 INR.  After completing the 4 weeks of Coumadin, the patient may stop the Coumadin and resume their 81 mg Aspirin daily. INR was 1.52 on the day of transfer.  Continue Lovenox injections until the INR is therapeutic at or greater than 2.0.  When INR reaches the therapeutic level of equal to or greater than 2.0, the patient may discontinue the Lovenox injections.   Diet: Cardiac diet and Diabetic diet Activity:WBAT Follow-up:in 2 weeks Disposition - Rehab Discharged Condition: good   Discharge Orders   Future Orders Complete By Expires     CPM  As directed     Comments:      Continuous passive motion machine (CPM):      Use the CPM from 6 to 8 hours per day.      You may increase by 5-10 degrees per day.  Break it up into 2 or 3 sessions per day.      Use CPM for 2 weeks while on rehab.    Call MD / Call 911  As directed     Comments:      If you experience chest pain or shortness of breath, CALL 911 and be transported to the hospital emergency room.  If you develope a fever above 101 F, pus (white drainage) or increased drainage or redness at the wound, or calf pain, call your surgeon's office.    Change dressing  As directed     Comments:      Change dressing daily with sterile 4 x 4 inch gauze dressing and apply TED hose. Do not submerge the incision under water.    Constipation Prevention  As directed     Comments:      Drink plenty of fluids.  Prune juice may  be helpful.  You may use a stool softener, such as Colace (over the counter) 100 mg twice a day.  Use MiraLax (over the counter) for constipation as needed.    Diet - low sodium heart healthy  As directed     Diet general  As directed     Discharge instructions  As directed     Comments:      Pick up stool softner and laxative for home. Do not submerge incision  under water. May shower. Continue to use ice for pain and swelling from surgery.  Take Coumadin for four weeks and then discontinue.  The dose may need to be adjusted based upon the INR.  Please follow the INR and titrate Coumadin dose for a therapeutic range between 2.0 and 3.0 INR.  After completing the four weeks of Coumadin, the patient may stop the Coumadin and then start 81 mg Aspirin daily.    Do not put a pillow under the knee. Place it under the heel.  As directed     Do not sit on low chairs, stoools or toilet seats, as it may be difficult to get up from low surfaces  As directed     Driving restrictions  As directed     Comments:      No driving until released by the physician.    Increase activity slowly as tolerated  As directed     Lifting restrictions  As directed     Comments:      No lifting until released by the physician.    Patient may shower  As directed     Comments:      You may shower without a dressing once there is no drainage.  Do not wash over the wound.  If drainage remains, do not shower until drainage stops.    TED hose  As directed     Comments:      Use stockings (TED hose) for 3 weeks on both leg(s).  You may remove them at night for sleeping.    Weight bearing as tolerated  As directed         Medication List    STOP taking these medications       multivitamin with minerals Tabs      TAKE these medications       acetaminophen 325 MG tablet  Commonly known as:  TYLENOL  Take 2 tablets (650 mg total) by mouth every 6 (six) hours as needed.     bisacodyl 10 MG suppository  Commonly  known as:  DULCOLAX  Place 1 suppository (10 mg total) rectally daily as needed.     diphenhydrAMINE 12.5 MG/5ML elixir  Commonly known as:  BENADRYL  Take 5-10 mLs (12.5-25 mg total) by mouth every 4 (four) hours as needed for itching.     DSS 100 MG Caps  Take 100 mg by mouth 2 (two) times daily.     enoxaparin 30 MG/0.3ML injection  Commonly known as:  LOVENOX  Inject 0.3 mLs (30 mg total) into the skin every 12 (twelve) hours. Continue Lovenox injections until the INR is therapeutic at or greater than 2.0.  When INR reaches the therapeutic level of equal to or greater than 2.0, the patient may discontinue the Lovenox injections.     glimepiride 2 MG tablet  Commonly known as:  AMARYL  Take 1 tablet (2 mg total) by mouth 2 (two) times daily.     HYDROmorphone 2 MG tablet  Commonly known as:  DILAUDID  Take 1-3 tablets (2-6 mg total) by mouth every 3 (three) hours as needed.     Liraglutide 18 MG/3ML Soln injection  Commonly known as:  VICTOZA  Inject 0.2 mLs (1.2 mg total) into the skin daily.     metFORMIN 1000 MG tablet  Commonly known as:  GLUCOPHAGE  Take 1 tablet (1,000 mg total) by mouth 2 (two) times daily with a meal.     methocarbamol 500 MG  tablet  Commonly known as:  ROBAXIN  Take 1 tablet (500 mg total) by mouth every 6 (six) hours as needed.     metoCLOPramide 5 MG tablet  Commonly known as:  REGLAN  Take 1-2 tablets (5-10 mg total) by mouth every 8 (eight) hours as needed (if ondansetron (ZOFRAN) ineffective.).     omeprazole 20 MG capsule  Commonly known as:  PRILOSEC  Take 20 mg by mouth daily.     ondansetron 4 MG tablet  Commonly known as:  ZOFRAN  Take 1 tablet (4 mg total) by mouth every 6 (six) hours as needed for nausea.     polycarbophil 625 MG tablet  Commonly known as:  FIBERCON  Take 625 mg by mouth daily.     polyethylene glycol packet  Commonly known as:  MIRALAX / GLYCOLAX  Take 17 g by mouth daily as needed.     traMADol 50 MG  tablet  Commonly known as:  ULTRAM  Take 1-2 tablets (50-100 mg total) by mouth every 6 (six) hours as needed.     zolpidem 5 MG tablet  Commonly known as:  AMBIEN  Take 1 tablet (5 mg total) by mouth at bedtime as needed for sleep.           Follow-up Information   Follow up with Loanne Drilling, MD. Schedule an appointment as soon as possible for a visit in 2 weeks.   Contact information:   [address] [place] [address] [phone]       Signed: Patrica Duel 08/01/2012, 7:27 AM

## 2012-08-01 NOTE — Progress Notes (Signed)
ANTICOAGULATION CONSULT NOTE - Follow Up Consult  Pharmacy Consult for coumadin Indication: VTE prophylaxis  No Known Allergies  Patient Measurements: Height: 5\' 7"  (170.2 cm) Weight: 234 lb 11.2 oz (106.459 kg) IBW/kg (Calculated) : 66.1 Heparin Dosing Weight:   Vital Signs: Temp: 99.1 F (37.3 C) (05/19 0753) Temp src: Oral (05/19 1330) BP: 148/79 mmHg (05/19 1330) Pulse Rate: 103 (05/19 0753)  Labs:  Recent Labs  07/30/12 0537 07/31/12 0524 08/01/12 0446  HGB 8.6*  --   --   HCT 25.6*  --   --   PLT 212  --   --   LABPROT 13.8 14.8 17.9*  INR 1.07 1.18 1.52*    Estimated Creatinine Clearance: 106.7 ml/min (by C-G formula based on Cr of 0.9).   Medications:  Scheduled:  . enoxaparin (LOVENOX) injection  30 mg Subcutaneous Q12H  . glimepiride  2 mg Oral BID AC  . insulin aspart  0-15 Units Subcutaneous TID WC  . Liraglutide  1.2 mg Subcutaneous Daily  . metFORMIN  1,000 mg Oral BID WC  . [START ON 08/02/2012] omeprazole  20 mg Oral Daily  . warfarin  10 mg Oral ONCE-1800  . Warfarin - Pharmacist Dosing Inpatient   Does not apply q1800   Infusions:    Assessment: [name] is currently on subtherapeutic coumadin.  INR, however, jumped from 1.18 to 1.52 after 2 doses of 10mg  of coumadin.  Also on lovenox 30mg  sq q12h Goal of Therapy:  INR 2-3 Monitor platelets by anticoagulation protocol: Yes   Plan:  1) Coumadin 7.5mg  po x1 2) Daily PT/INR       3) Warfarin education completed 5/16    Koran Seabrook, Tsz-Yin 08/01/2012,2:06 PM

## 2012-08-01 NOTE — Progress Notes (Signed)
Discharge summary sent to payer through [facility]  

## 2012-08-02 ENCOUNTER — Inpatient Hospital Stay (HOSPITAL_COMMUNITY): Payer: PRIVATE HEALTH INSURANCE | Admitting: Occupational Therapy

## 2012-08-02 ENCOUNTER — Inpatient Hospital Stay (HOSPITAL_COMMUNITY): Payer: PRIVATE HEALTH INSURANCE

## 2012-08-02 DIAGNOSIS — M169 Osteoarthritis of hip, unspecified: Secondary | ICD-10-CM

## 2012-08-02 DIAGNOSIS — D62 Acute posthemorrhagic anemia: Secondary | ICD-10-CM

## 2012-08-02 DIAGNOSIS — M7989 Other specified soft tissue disorders: Secondary | ICD-10-CM

## 2012-08-02 DIAGNOSIS — S78119A Complete traumatic amputation at level between unspecified hip and knee, initial encounter: Secondary | ICD-10-CM

## 2012-08-02 LAB — COMPREHENSIVE METABOLIC PANEL
ALT: 49 U/L (ref 0–53)
Albumin: 2.1 g/dL — ABNORMAL LOW (ref 3.5–5.2)
Alkaline Phosphatase: 225 U/L — ABNORMAL HIGH (ref 39–117)
Chloride: 94 mEq/L — ABNORMAL LOW (ref 96–112)
GFR calc Af Amer: >90 mL/min (ref >90)
Glucose, Bld: 135 mg/dL — ABNORMAL HIGH (ref 70–99)
Potassium: 3.9 mEq/L (ref 3.5–5.1)
Sodium: 135 mEq/L (ref 135–145)
Total Protein: 6.2 g/dL (ref 6.0–8.3)

## 2012-08-02 LAB — CBC WITH DIFFERENTIAL/PLATELET
Eosinophils Absolute: 0.5 10*3/uL (ref 0.0–0.7)
Lymphs Abs: 1.5 10*3/uL (ref 0.7–4.0)
MCH: 30 pg (ref 26.0–34.0)
Neutro Abs: 5.3 10*3/uL (ref 1.7–7.7)
Neutrophils Relative %: 64 % (ref 43–77)
Platelets: 322 10*3/uL (ref 150–400)
RBC: 2.5 MIL/uL — ABNORMAL LOW (ref 4.22–5.81)
WBC: 8.3 10*3/uL (ref 4.0–10.5)

## 2012-08-02 LAB — GLUCOSE, CAPILLARY
Glucose-Capillary: 128 mg/dL — ABNORMAL HIGH (ref 70–99)
Glucose-Capillary: 141 mg/dL — ABNORMAL HIGH (ref 70–99)
Glucose-Capillary: 140 mg/dL — ABNORMAL HIGH (ref 70–99)

## 2012-08-02 MED ORDER — FE FUMARATE-B12-VIT C-FA-IFC PO CAPS
1.0000 | ORAL_CAPSULE | Freq: Three times a day (TID) | ORAL | Status: DC
  Administered 2012-08-02 – 2012-08-05 (×10): 1 via ORAL
  Filled 2012-08-02 (×13): qty 1

## 2012-08-02 MED ORDER — TRAZODONE HCL 50 MG PO TABS
50.0000 mg | ORAL_TABLET | Freq: Every evening | ORAL | Status: DC | PRN
  Administered 2012-08-02: 50 mg via ORAL
  Filled 2012-08-02: qty 1

## 2012-08-02 MED ORDER — DIPHENHYDRAMINE HCL 25 MG PO CAPS
25.0000 mg | ORAL_CAPSULE | Freq: Four times a day (QID) | ORAL | Status: DC | PRN
  Administered 2012-08-02 – 2012-08-03 (×2): 25 mg via ORAL
  Filled 2012-08-02 (×2): qty 1

## 2012-08-02 MED ORDER — WARFARIN SODIUM 7.5 MG PO TABS
7.5000 mg | ORAL_TABLET | Freq: Once | ORAL | Status: AC
  Administered 2012-08-02: 7.5 mg via ORAL
  Filled 2012-08-02: qty 1

## 2012-08-02 NOTE — Progress Notes (Signed)
Patient ID: Rickey Anderson, male   DOB: 1955/10/11, 57 y.o.   MRN: [id number] Subjective/Complaints: 57 y.o. right-handed male admitted 07/27/2012 with end stage changes bilateral knees secondary to osteoarthritis and no relief with conservative care. Patient with noted procedures on the knees include arthroscopy and meniscectomy. Underwent bilateral total knee arthroplasty 07/27/2012 per Dr. Despina Hick. Placed on Coumadin for DVT prophylaxis and weightbearing as tolerated bilateral lower extremities. Postoperative pain management. Acute blood loss anemia with hemoglobin 8.6 and monitored. Physical and occupational therapy evaluations completed an ongoing  Review of Systems  Musculoskeletal: Positive for joint pain.       Both knees and Right ankle  All other systems reviewed and are negative.    Objective: Vital Signs: Blood pressure 118/67, pulse 103, temperature 97.5 F (36.4 C), temperature source Oral, resp. rate 19, height 5\' 7"  (1.702 m), weight 106.459 kg (234 lb 11.2 oz), SpO2 94.00%. No results found. Results for orders placed during the hospital encounter of 08/01/12 (from the past 72 hour(s))  GLUCOSE, CAPILLARY     Status: Abnormal   Collection Time    08/01/12  4:30 PM      Result Value Range   Glucose-Capillary 161 (*) 70 - 99 mg/dL  GLUCOSE, CAPILLARY     Status: Abnormal   Collection Time    08/01/12  8:40 PM      Result Value Range   Glucose-Capillary 121 (*) 70 - 99 mg/dL   Comment 1 Notify RN    CBC WITH DIFFERENTIAL     Status: Abnormal   Collection Time    08/02/12  6:12 AM      Result Value Range   WBC 8.3  4.0 - 10.5 K/uL   RBC 2.50 (*) 4.22 - 5.81 MIL/uL   Hemoglobin 7.5 (*) 13.0 - 17.0 g/dL   HCT 69.6 (*) 29.5 - 28.4 %   MCV 87.6  78.0 - 100.0 fL   MCH 30.0  26.0 - 34.0 pg   MCHC 34.2  30.0 - 36.0 g/dL   RDW 13.2  44.0 - 10.2 %   Platelets 322  150 - 400 K/uL   Neutrophils Relative % 64  43 - 77 %   Neutro Abs 5.3  1.7 - 7.7 K/uL   Lymphocytes  Relative 19  12 - 46 %   Lymphs Abs 1.5  0.7 - 4.0 K/uL   Monocytes Relative 10  3 - 12 %   Monocytes Absolute 0.8  0.1 - 1.0 K/uL   Eosinophils Relative 6 (*) 0 - 5 %   Eosinophils Absolute 0.5  0.0 - 0.7 K/uL   Basophils Relative 1  0 - 1 %   Basophils Absolute 0.1  0.0 - 0.1 K/uL  COMPREHENSIVE METABOLIC PANEL     Status: Abnormal   Collection Time    08/02/12  6:12 AM      Result Value Range   Sodium 135  135 - 145 mEq/L   Potassium 3.9  3.5 - 5.1 mEq/L   Chloride 94 (*) 96 - 112 mEq/L   CO2 30  19 - 32 mEq/L   Glucose, Bld 135 (*) 70 - 99 mg/dL   BUN 11  6 - 23 mg/dL   Creatinine, Ser 7.25  0.50 - 1.35 mg/dL   Calcium 9.0  8.4 - 36.6 mg/dL   Total Protein 6.2  6.0 - 8.3 g/dL   Albumin 2.1 (*) 3.5 - 5.2 g/dL   AST 32  0 - 37 U/L  ALT 49  0 - 53 U/L   Alkaline Phosphatase 225 (*) 39 - 117 U/L   Total Bilirubin 0.7  0.3 - 1.2 mg/dL   GFR calc non Af Amer >90  >90 mL/min   GFR calc Af Amer >90  >90 mL/min   Comment:            The eGFR has been calculated     using the CKD EPI equation.     This calculation has not been     validated in all clinical     situations.     eGFR's persistently     <90 mL/min signify     possible Chronic Kidney Disease.  PROTIME-INR     Status: Abnormal   Collection Time    08/02/12  6:12 AM      Result Value Range   Prothrombin Time 19.3 (*) 11.6 - 15.2 seconds   INR 1.69 (*) 0.00 - 1.49  URIC ACID     Status: Abnormal   Collection Time    08/02/12  6:12 AM      Result Value Range   Uric Acid, Serum 3.5 (*) 4.0 - 7.8 mg/dL  GLUCOSE, CAPILLARY     Status: Abnormal   Collection Time    08/02/12  7:24 AM      Result Value Range   Glucose-Capillary 128 (*) 70 - 99 mg/dL   Comment 1 Notify RN        HEENT: normal Cardio: RRR and no murmur Resp: CTA B/L and unlabored GI: BS positive and non distended Extremity:  Pulses positive and Edema pre tib Skin:   Wound C/D/I Neuro: Alert/Oriented, Cranial Nerve II-XII normal, Normal Sensory  and Abnormal Motor 5/5 in BUE, 3-/5 B HF and KE, R ankle DF/PF, 4/5 L ankle DF/PF Musc/Skel:  Normal and Extremity tender Bilateral knees and R lateral ankle below malleolus Gen: NAD   Assessment/Plan: 1. Functional deficits secondary to Bilateral Knee enstage OA, S/P B TKR which require 3+ hours per day of interdisciplinary therapy in a comprehensive inpatient rehab setting. Physiatrist is providing close team supervision and 24 hour management of active medical problems listed below. Physiatrist and rehab team continue to assess barriers to discharge/monitor patient progress toward functional and medical goals. FIM:                   Comprehension Comprehension Mode: Auditory Comprehension: 7-Follows complex conversation/direction: With no assist  Expression Expression Mode: Verbal Expression: 7-Expresses complex ideas: With no assist  Social Interaction Social Interaction: 6-Interacts appropriately with others with medication or extra time (anti-anxiety, antidepressant).  Problem Solving Problem Solving: 6-Solves complex problems: With extra time  Memory Memory: 6-Assistive device: No helper Medical Problem List and Plan:  1. 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 DVT Prophylaxis/Anticoagulation: Coumadin for DVT prophylaxis. Check Doppler studies. Latest INR 1.52. Continue Lovenox until INR greater than 2.00  3. Pain Management: Ultram 50-100 mg every 6 hours as needed and Robaxin. Monitor with increased mobility  4. Neuropsych: This patient is capable of making decisions on his own behalf.  5. Acute blood loss anemia. Latest hemoglobin 7.5 orthostatic vitals ok, monitor for symptoms during therapy transfuse 2 U PRBCs if needed 6. Non-insulin-dependent diabetes mellitus. Latest hemoglobin A1c of 7.6. Amaryl 2 mg twice a day,Victoza 1.2 mg subcutaneous daily, Glucophage 1000 mg twice a day. Check blood sugars a.c. and at bedtime. Adjust  regimen as needed  7. GERD. Prilosec  8.swelling and  pain R lateral ankle, hx of ankle sprain in past Uric acid is negative, check x ray, aircast, ice after therapy  LOS (Days) 1 A FACE TO FACE EVALUATION WAS PERFORMED  Equilla Que E 08/02/2012, 9:17 AM

## 2012-08-02 NOTE — Progress Notes (Signed)
Orthopedic Tech Progress Note Patient Details:  Rickey Anderson 06-25-1955 [id number]  CPM Left Knee CPM Left Knee: Off CPM Right Knee CPM Right Knee: On Right Knee Flexion (Degrees): 70 Right Knee Extension (Degrees): 0   [name] 08/02/2012, 8:08 PM

## 2012-08-02 NOTE — Progress Notes (Addendum)
Occupational Therapy Session Note  Patient Details  Name: Rickey Anderson MRN: [id number] Date of Birth: 1956-02-26  Today's Date: 08/02/2012 Time: 7846-9629 Time Calculation (min): 45 min  Short Term Goals: Week 1:  OT Short Term Goal 1 (Week 1): STGs = LTGs   Skilled Therapeutic Interventions/Progress Updates:  Self care retraining to include shower dress and back to bed.  Focused session on activity tolerance, sit><stands from various height surfaces, ambulate into and out of the bathroom, standing tolerance and bed mobility.  Patient unable to don socks or shoes and declined to don underware today.  Significant skin irritation at lower back as well as minor irritation on buttocks and down the back of his legs-RN aware.   Therapy Documentation Precautions:  Precautions Precautions: Knee;Fall Restrictions Weight Bearing Restrictions: Yes RLE Weight Bearing: Weight bearing as tolerated LLE Weight Bearing: Weight bearing as tolerated Pain: Pain Assessment Pain Assessment: 0-10 Pain Score:   6 Pain Type: Acute pain Pain Location: Foot Pain Orientation: Right Pain Descriptors: Aching;Discomfort Patients Stated Pain Goal: 4 Pain Intervention(s): Medication (See eMAR), rest and repositioned Multiple Pain Sites: Yes 2nd Pain Site Pain Score: 5 Pain Location: Knee Pain Orientation: Right 3rd Pain Site Pain Location: Knee Pain Orientation: Left ADL: See FIM for current functional status  Therapy/Group: Individual Therapy  Annalisse Minkoff 08/02/2012, 12:38 PM

## 2012-08-02 NOTE — Progress Notes (Signed)
*  Preliminary Results* Bilateral lower extremity venous duplex completed. Bilateral lower extremities are negative for deep vein thrombosis. No evidence of Baker's cyst bilaterally.  08/02/2012 3:55 PM Gertie Fey, RDMS, RDCS

## 2012-08-02 NOTE — Progress Notes (Signed)
Orthopedic Tech Progress Note Patient Details:  Rickey Anderson [birth year] [id number] Right ankle aircast splint applied. Patient shown proper way to apply splint. Patient working with PT and wanted to ice ankle after treatment. Air cast splint taken off for patient to ice.  Ortho Devices Type of Ortho Device: Ankle Air splint Ortho Device/Splint Location: Right Ortho Device/Splint Interventions: Application   Asia R Thompson 08/02/2012, 9:02 AM

## 2012-08-02 NOTE — Evaluation (Signed)
Physical Therapy Assessment and Plan  Patient Details  Name: Rickey Anderson MRN: [id number] Date of Birth: 01/31/1956  PT Diagnosis: Difficulty walking and Pain in joint Rehab Potential: Excellent ELOS: 5 to 7 days   Today's Date: 08/02/2012 Time: 0800-0905 Time Calculation (min): 65 min  Problem List:  Patient Active Problem List   Diagnosis Date Noted  . S/P TKR (total knee replacement)bilat 08/01/2012  . Postoperative anemia due to acute blood loss 07/29/2012  . OA (osteoarthritis) of knee 07/27/2012  . Chronic sinusitis 11/04/2011  . Nonspecific elevation of level of transaminase or lactic acid dehydrogenase (LDH) 11/04/2011  . HEMATURIA UNSPECIFIED 03/04/2009  . HYPERLIPIDEMIA 08/22/2007  . UNSPECIFIED PROSTATITIS 08/22/2007  . DIABETES MELLITUS, TYPE II 09/24/2006  . GERD 09/24/2006    Past Medical History:  Past Medical History  Diagnosis Date  . GERD (gastroesophageal reflux disease)   . Diabetes mellitus   . Hyperlipidemia   . Herpes   . Norovirus     1'14- no reoccurring issues   Past Surgical History:  Past Surgical History  Procedure Laterality Date  . Knee surgery      both knees-scopes  . Tonsillectomy    . Functional endoscopic sinus surgery      Deviated septum repair 10'13  . Vasectomy    . Eardrum      hx. perforated ear drum surgery right  . Total knee arthroplasty Bilateral 07/27/2012    Procedure: TOTAL KNEE BILATERAL;  Surgeon: Loanne Drilling, MD;  Location: [facility];  Service: Orthopedics;  Laterality: Bilateral;    Assessment & Plan Clinical Impression: Rickey Anderson is a 57 y.o. right-handed male admitted 07/27/2012 with end stage changes bilateral knees secondary to osteoarthritis and no relief with conservative care. Patient with noted procedures on the knees include arthroscopy and meniscectomy. Underwent bilateral total knee arthroplasty 07/27/2012 per Dr. Despina Hick. Placed on Coumadin for DVT prophylaxis and weightbearing as  tolerated bilateral lower extremities. Postoperative pain management. Acute blood loss anemia with hemoglobin 8.6 and monitored. Physical and occupational therapy evaluations completed an ongoing. M.D. as requested physical medicine rehabilitation consult to consider inpatient rehabilitation services. Patient was felt to be a good candidate for inpatient rehabilitation services was admitted for comprehensive rehabilitation program on 08/01/2012.   Patient currently requires min with mobility except mod assist for sit to stand secondary to muscle weakness and muscle joint tightness.  Prior to hospitalization, patient was independent  with mobility and lived with Spouse in a House home.  Home access is 2Stairs to enter with no rails.  Patient will benefit from skilled PT intervention to maximize safe functional mobility and minimize fall risk for planned discharge home with intermittent assist for stairs only.  Anticipate patient will benefit from follow up OP at discharge.  PT - End of Session Activity Tolerance: Tolerates 30+ min activity without fatigue PT Assessment Rehab Potential: Excellent Barriers to Discharge: None PT Plan PT Intensity: Minimum of 1-2 x/day ,45 to 90 minutes PT Frequency: 5 out of 7 days PT Duration Estimated Length of Stay: 5 to 7 days PT Treatment/Interventions: Ambulation/gait training;Discharge planning;DME/adaptive equipment instruction;Functional mobility training;Patient/family education;Stair training;Therapeutic Activities;Therapeutic Exercise;UE/LE Strength taining/ROM;Wheelchair propulsion/positioning PT Recommendation Follow Up Recommendations: Outpatient PT Patient destination: Home Equipment Recommended: Rolling walker with 5" wheels;Wheelchair (measurements)  Skilled Therapeutic Intervention Patient sitting up in bed upon entering room. Patient supine to sit with HOB raised and bed rails and supervision. Patient sit to stand with RW and mod lifting assist.  Standing Bp taken -  see below. Patient ambulated 160 feet with RW and min steady assist x 2. Patient ambulated up and down 3 steps with 2 railings and min assist. Worked on knee ROM flexion at 71 degrees on left and 77 degrees on right. Patient left in recliner with ice on right foot and items in reach.  PT Evaluation Precautions/Restrictions Precautions Precautions: Knee;Fall Restrictions Weight Bearing Restrictions: Yes RLE Weight Bearing: Weight bearing as tolerated LLE Weight Bearing: Weight bearing as tolerated General Chart Reviewed: Yes Family/Caregiver Present: No Vital SignsTherapy Vitals Pulse Rate: 103 BP: 118/67 mmHg Patient Position, if appropriate: Standing Pain Pain Assessment Pain Assessment: 0-10 Pain Score:   5 Pain Type: Acute pain Pain Location: Foot Pain Orientation: Right Pain Descriptors: Aching Patients Stated Pain Goal: 4 Pain Intervention(s): Medication (See eMAR) Multiple Pain Sites: Yes 2nd Pain Site Pain Score: 5 Pain Location: Knee Pain Orientation: Right 3rd Pain Site Pain Location: Knee Pain Orientation: Left Home Living/Prior Functioning Home Living Lives With: Spouse Available Help at Discharge: Available PRN/intermittently Type of Home: House Home Access: Stairs to enter Secretary/administrator of Steps: 2 Entrance Stairs-Rails: None Home Layout: One level Bathroom Shower/Tub: Health visitor: Standard (has elevated toilet seat) Home Adaptive Equipment: Shower chair with back Prior Function Level of Independence: Independent with basic ADLs;Independent with gait Able to Take Stairs?: Yes Driving: Yes Vocation: Full time employment 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 Vision/Perception  Vision - History Baseline Vision: Wears glasses only for reading Patient Visual Report: No change from baseline Vision - Assessment Eye Alignment: Within Functional Limits Perception Perception: Within Functional  Limits Praxis Praxis: Intact  Cognition Overall Cognitive Status: Within Functional Limits for tasks assessed Arousal/Alertness: Awake/alert Orientation Level: Oriented X4 Sensation Sensation Light Touch: Appears Intact Stereognosis: Appears Intact Hot/Cold: Appears Intact Proprioception: Appears Intact Coordination Gross Motor Movements are Fluid and Coordinated: Yes Fine Motor Movements are Fluid and Coordinated: Yes Motor  Motor Motor: Within Functional Limits  Mobility Bed Mobility Bed Mobility: Supine to Sit Supine to Sit: HOB elevated;With rails;5: Supervision Transfers Sit to Stand: 4: Min assist;From bed;With upper extremity assist Stand to Sit: 4: Min assist;With upper extremity assist;To bed;To elevated surface Locomotion  Ambulation Ambulation: Yes Ambulation/Gait Assistance: 4: Min guard Ambulation Distance (Feet): 160 Feet Assistive device: Rolling walker Ambulation/Gait Assistance Details: no knee immobilizer Gait Gait: Yes Gait Pattern: Impaired Gait Pattern: Antalgic;Decreased weight shift to right;Decreased hip/knee flexion - right;Decreased hip/knee flexion - left;Step-through pattern;Trunk flexed;Wide base of support Stairs / Additional Locomotion Stairs: Yes Stairs Assistance: 4: Min assist Stair Management Technique: Two rails Number of Stairs: 3 Height of Stairs: 4  Trunk/Postural Assessment  Cervical Assessment Cervical Assessment: Within Functional Limits Thoracic Assessment Thoracic Assessment: Within Functional Limits Lumbar Assessment Lumbar Assessment: Within Functional Limits Postural Control Postural Control: Within Functional Limits  Balance Dynamic Standing Balance Dynamic Standing - Level of Assistance: 5: Stand by assistance (with UE on walker) Extremity Assessment  RUE Assessment RUE Assessment: Within Functional Limits LUE Assessment LUE Assessment: Within Functional Limits RLE Assessment RLE Assessment: Exceptions to  [facility] RLE AROM (degrees) Overall AROM Right Lower Extremity: Deficits;Due to pain RLE Overall AROM Comments: 77 degrees knee flexion actively in sitting; ankle ROM limited by pain RLE Strength RLE Overall Strength: Due to pain;Deficits RLE Overall Strength Comments: Patient has acive movement against gravity - grossly 3+/5 for knee extension; resistance not given at ankle due to pain LLE Assessment LLE Assessment: Exceptions to WFL LLE AROM (degrees) Overall AROM Left Lower Extremity: Deficits;Due  to pain LLE Overall AROM Comments: 71 degrees knee flexion in sitting; full hip and ankle AROM LLE Strength LLE Overall Strength: Deficits;Due to pain LLE Overall Strength Comments: moves against gravity; grossly 3+/5 knee extension; 4+/5 ankle dorsiflexion  FIM:  FIM - Bed/Chair Transfer Bed/Chair Transfer Assistive Devices: Bed rails;HOB elevated Bed/Chair Transfer: 5: Supine > Sit: Supervision (verbal cues/safety issues);3: Bed > Chair or W/C: Mod A (lift or lower assist);3: Chair or W/C > Bed: Mod A (lift or lower assist) FIM - Locomotion: Ambulation Locomotion: Ambulation Assistive Devices: Designer, industrial/product Ambulation/Gait Assistance: 4: Min guard Locomotion: Ambulation: 4: Travels 150 ft or more with minimal assistance (Pt.>75%) FIM - Locomotion: Stairs Locomotion: Building control surveyor: Hand rail - 2 Locomotion: Stairs: 1: Up and Down < 4 stairs with minimal assistance (Pt.>75%)   Refer to Care Plan for Long Term Goals  Recommendations for other services: None  Discharge Criteria: Patient will be discharged from PT if patient refuses treatment 3 consecutive times without medical reason, if treatment goals not met, if there is a change in medical status, if patient makes no progress towards goals or if patient is discharged from hospital.  The above assessment, treatment plan, treatment alternatives and goals were discussed and mutually agreed upon: by patient  Alma Friendly 08/02/2012, 12:03 PM

## 2012-08-02 NOTE — Evaluation (Signed)
Occupational Therapy Assessment and Plan  Patient Details  Name: Rickey Anderson MRN: [id number] Date of Birth: [birth year]  OT Diagnosis: muscle weakness (generalized) and pain in joint Rehab Potential: Rehab Potential: Good ELOS: 5-7 days   Today's Date: 08/02/2012 Time: 0900-0950 Time Calculation (min): 50 min  Problem List:  Patient Active Problem List   Diagnosis Date Noted  . S/P TKR (total knee replacement)bilat 08/01/2012  . Postoperative anemia due to acute blood loss 07/29/2012  . OA (osteoarthritis) of knee 07/27/2012  . Chronic sinusitis 11/04/2011  . Nonspecific elevation of level of transaminase or lactic acid dehydrogenase (LDH) 11/04/2011  . HEMATURIA UNSPECIFIED 03/04/2009  . HYPERLIPIDEMIA 08/22/2007  . UNSPECIFIED PROSTATITIS 08/22/2007  . DIABETES MELLITUS, TYPE II 09/24/2006  . GERD 09/24/2006    Past Medical History:  Past Medical History  Diagnosis Date  . GERD (gastroesophageal reflux disease)   . Diabetes mellitus   . Hyperlipidemia   . Herpes   . Norovirus     1'14- no reoccurring issues   Past Surgical History:  Past Surgical History  Procedure Laterality Date  . Knee surgery      both knees-scopes  . Tonsillectomy    . Functional endoscopic sinus surgery      Deviated septum repair 10'13  . Vasectomy    . Eardrum      hx. perforated ear drum surgery right  . Total knee arthroplasty Bilateral 07/27/2012    Procedure: TOTAL KNEE BILATERAL;  Surgeon: Loanne Drilling, MD;  Location: [facility];  Service: Orthopedics;  Laterality: Bilateral;    Assessment & Plan Clinical Impression: Rickey Anderson is a 57 y.o. right-handed male admitted 07/27/2012 with end stage changes bilateral knees secondary to osteoarthritis and no relief with conservative care. Patient with noted procedures on the knees include arthroscopy and meniscectomy. Underwent bilateral total knee arthroplasty 07/27/2012 per Dr. Despina Hick. Placed on Coumadin for DVT prophylaxis  and weightbearing as tolerated bilateral lower extremities. Postoperative pain management. Acute blood loss anemia with hemoglobin 8.6 and monitored. Physical and occupational therapy evaluations completed an ongoing. M.D. as requested physical medicine rehabilitation consult to consider inpatient rehabilitation services.   Patient transferred to CIR on 08/01/2012 .    Patient currently requires supervision with basic self-care skills secondary to muscle weakness and decreased standing balance.  Prior to hospitalization, patient was independent with ADLs. Patient will benefit from skilled intervention to increase independence with basic self-care skills and increase level of independence with iADL prior to discharge home with care partner.  Anticipate patient will require intermittent supervision and no further OT follow recommended.  OT - End of Session Activity Tolerance: Tolerates 30+ min activity with multiple rests OT Assessment Rehab Potential: Good Barriers to Discharge: None OT Plan OT Intensity: Minimum of 1-2 x/day, 45 to 90 minutes OT Frequency: 5 out of 7 days OT Duration/Estimated Length of Stay: 5-7 days OT Treatment/Interventions: Balance/vestibular training;Discharge planning;DME/adaptive equipment instruction;Functional mobility training;Patient/family education;Self Care/advanced ADL retraining;Therapeutic Activities;Therapeutic Exercise;UE/LE Strength taining/ROM OT Recommendation Patient destination: Home Follow Up Recommendations: None   Skilled Therapeutic Intervention Pt seen for OT initial evaluation and basis self care retraining with a focus on sit to stand and functional mobility in the room. Pt was apprehensive about ambulating on his right ankle that was painful. Educated pt to put more weight through his arms. Pt only worked on grooming, toileting, and toilet transfers this am. He wants to wait to bathe until his wife brings him clothing. Pt was supervision with sit  to stand with cuing for foot/hand placement, supervision with toileting. He sat in w/c to work on grooming tasks at sink.    OT Evaluation Precautions/Restrictions  Precautions Precautions: Knee;Fall Restrictions Weight Bearing Restrictions: Yes RLE Weight Bearing: Weight bearing as tolerated LLE Weight Bearing: Weight bearing as tolerated General Amount of Missed OT Time (min): 10 Minutes - pt needed to rest ankle and did not want to proceed with B/D until new clothing arrived Pain Pain Assessment Pain Assessment: 0-10 Pain Score:   5 Pain Type: Acute pain Pain Location: Foot Pain Orientation: Right Pain Descriptors: Aching Patients Stated Pain Goal: 4 Pain Intervention(s): Medication (See eMAR) Multiple Pain Sites: Yes 2nd Pain Site Pain Score: 5 Pain Location: Knee Pain Orientation: Right 3rd Pain Site Pain Location: Knee Pain Orientation: Left Home Living/Prior Functioning Home Living Lives With: Spouse Available Help at Discharge: Available PRN/intermittently Type of Home: House Home Access: Stairs to enter Secretary/administrator of Steps: 2 Entrance Stairs-Rails: None Home Layout: One level Bathroom Shower/Tub: Health visitor: Standard (has elevated toilet seat) Home Adaptive Equipment: Shower chair with back Prior Function Level of Independence: Independent with basic ADLs;Independent with gait Able to Take Stairs?: Yes Driving: Yes Vocation: Full time employment 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 He wants to wait to bathe until his wife brings him clothing. Pt was supervision with sit to stand with cuing for foot/hand placement, supervision with toileting. He sat in w/c to work on grooming tasks at sink.  Vision/Perception  Vision - History Baseline Vision: Wears glasses only for reading Patient Visual Report: No change from baseline Vision -  Assessment Eye Alignment: Within Functional Limits Perception Perception: Within Functional Limits Praxis Praxis: Intact  Cognition Overall Cognitive Status: Within Functional Limits for tasks assessed Arousal/Alertness: Awake/alert Orientation Level: Oriented X4 Sensation Sensation Light Touch: Appears Intact Stereognosis: Appears Intact Hot/Cold: Appears Intact Proprioception: Appears Intact Coordination Gross Motor Movements are Fluid and Coordinated: Yes Fine Motor Movements are Fluid and Coordinated: Yes Motor  Motor Motor: Within Functional Limits Mobility  Bed Mobility Bed Mobility: Supine to Sit Supine to Sit: HOB elevated;With rails;5: Supervision Transfers Sit to Stand: 4: Min assist;From bed;With upper extremity assist Stand to Sit: 4: Min assist;With upper extremity assist;To bed;To elevated surface  Trunk/Postural Assessment  Cervical Assessment Cervical Assessment: Within Functional Limits Thoracic Assessment Thoracic Assessment: Within Functional Limits Lumbar Assessment Lumbar Assessment: Within Functional Limits Postural Control Postural Control: Within Functional Limits  Balance Dynamic Standing Balance Dynamic Standing - Level of Assistance: 5: Stand by assistance (with UE on walker) Extremity/Trunk Assessment RUE Assessment RUE Assessment: Within Functional Limits LUE Assessment LUE Assessment: Within Functional Limits  FIM:  FIM - Grooming Grooming Steps: Wash, rinse, dry face;Wash, rinse, dry hands;Oral care, brush teeth, clean dentures;Brush, comb hair;Shave or apply make-up Grooming: 7:Complete independence: no helper FIM - Bathing Bathing: 0: Activity did not occur FIM - Upper Body Dressing/Undressing Upper body dressing/undressing: 0: Activity did not occur FIM - Lower Body Dressing/Undressing Lower body dressing/undressing: 0: Activity did not occur FIM - Toileting Toileting steps completed by patient: Adjust clothing prior to  toileting;Performs perineal hygiene;Adjust clothing after toileting Toileting: 5: Supervision: Safety issues/verbal cues FIM - Archivist Transfers Assistive Devices: Elevated toilet seat;Walker;Grab bars Toilet Transfers: 5-To toilet/BSC: Supervision (verbal cues/safety issues);5-From toilet/BSC: Supervision (verbal cues/safety issues) FIM - Tub/Shower Transfers Tub/shower Transfers: 0-Activity did not occur or was simulated   Refer to  Care Plan for Long Term Goals  Recommendations for other services: None  Discharge Criteria: Patient will be discharged from OT if patient refuses treatment 3 consecutive times without medical reason, if treatment goals not met, if there is a change in medical status, if patient makes no progress towards goals or if patient is discharged from hospital.  The above assessment, treatment plan, treatment alternatives and goals were discussed and mutually agreed upon: by patient  [facility] 08/02/2012, 11:48 AM

## 2012-08-02 NOTE — Care Management Note (Signed)
Inpatient Rehabilitation Center Individual Statement of Services  Patient Name:  Rickey Anderson  Date:  08/02/2012  Welcome to the Inpatient Rehabilitation Center.  Our goal is to provide you with an individualized program based on your diagnosis and situation, designed to meet your specific needs.  With this comprehensive rehabilitation program, you will be expected to participate in at least 3 hours of rehabilitation therapies Monday-Friday, with modified therapy programming on the weekends.  Your rehabilitation program will include the following services:  Physical Therapy (PT), Occupational Therapy (OT), 24 hour per day rehabilitation nursing, Case Management ( Social Worker), Rehabilitation Medicine, Nutrition Services and Pharmacy Services  Weekly team conferences will be held on Wednesday to discuss your progress.  Your Social Worker will talk with you frequently to get your input and to update you on team discussions.  Team conferences with you and your family in attendance may also be held.  Expected length of stay: 5-7 days  Overall anticipated outcome: Mod/ i level  Depending on your progress and recovery, your program may change. Your Social Worker will coordinate services and will keep you informed of any changes. Your Child psychotherapist names and contact numbers are listed  below.  The following services may also be recommended but are not provided by the Inpatient Rehabilitation Center:   Driving Evaluations  Home Health Rehabiltiation Services  Outpatient Rehabilitatation [facility]  Vocational Rehabilitation   Arrangements will be made to provide these services after discharge if needed.  Arrangements include referral to agencies that provide these services.  Your insurance has been verified to be:  Medical mutual Your primary doctor is:  Dr Darryll Capers  Pertinent information will be shared with your doctor and your insurance company.  Social Worker:  Dossie Der, [place]  [phone]  Information discussed with and copy given to patient by: Lucy Chris, 08/02/2012, 10:21 AM

## 2012-08-02 NOTE — Progress Notes (Signed)
ANTICOAGULATION CONSULT NOTE - Follow Up Consult  Pharmacy Consult for coumadin Indication: VTE prophylaxis  No Known Allergies  Patient Measurements: Height: 5\' 7"  (170.2 cm) Weight: 234 lb 11.2 oz (106.459 kg) IBW/kg (Calculated) : 66.1 Heparin Dosing Weight:   Vital Signs: Temp: 97.5 F (36.4 C) (05/20 0620) Temp src: Oral (05/20 0620) BP: 118/67 mmHg (05/20 0815) Pulse Rate: 103 (05/20 0815)  Labs:  Recent Labs  07/31/12 0524 08/01/12 0446 08/02/12 0612  HGB  --   --  7.5*  HCT  --   --  21.9*  PLT  --   --  322  LABPROT 14.8 17.9* 19.3*  INR 1.18 1.52* 1.69*  CREATININE  --   --  0.74    Estimated Creatinine Clearance: 120 ml/min (by C-G formula based on Cr of 0.74).   Medications:  Scheduled:  . enoxaparin (LOVENOX) injection  30 mg Subcutaneous Q12H  . feeding supplement  120 mL Oral QID  . ferrous fumarate-b12-vitamic C-folic acid  1 capsule Oral TID PC  . glimepiride  2 mg Oral BID AC  . insulin aspart  0-15 Units Subcutaneous TID WC  . Liraglutide  1.2 mg Subcutaneous q1800  . metFORMIN  1,000 mg Oral BID WC  . omeprazole  20 mg Oral Daily  . Warfarin - Pharmacist Dosing Inpatient   Does not apply q1800   Infusions:    Assessment: [name] is currently on subtherapeutic coumadin.  INR up to 1.69.  On lovenox 30mg  sq q12h. Goal of Therapy:  INR 2-3 Monitor platelets by anticoagulation protocol: Yes   Plan:  1) Coumadin 7.5 mg po x 1 tonight.  2) Daily PT/INR  3) Continue Lovenox 30 mg sq q12h until INR therapeutic 4) Watch CBC   Rickey Anderson, Tsz-Yin 08/02/2012,8:36 AM

## 2012-08-02 NOTE — Progress Notes (Signed)
Social Work Assessment and Plan Social Work Assessment and Plan  Patient Details  Name: Rickey Anderson MRN: [id number] Date of Birth: [birth year]  Today's Date: 08/02/2012  Problem List:  Patient Active Problem List   Diagnosis Date Noted  . S/P TKR (total knee replacement)bilat 08/01/2012  . Postoperative anemia due to acute blood loss 07/29/2012  . OA (osteoarthritis) of knee 07/27/2012  . Chronic sinusitis 11/04/2011  . Nonspecific elevation of level of transaminase or lactic acid dehydrogenase (LDH) 11/04/2011  . HEMATURIA UNSPECIFIED 03/04/2009  . HYPERLIPIDEMIA 08/22/2007  . UNSPECIFIED PROSTATITIS 08/22/2007  . DIABETES MELLITUS, TYPE II 09/24/2006  . GERD 09/24/2006   Past Medical History:  Past Medical History  Diagnosis Date  . GERD (gastroesophageal reflux disease)   . Diabetes mellitus   . Hyperlipidemia   . Herpes   . Norovirus     1'14- no reoccurring issues   Past Surgical History:  Past Surgical History  Procedure Laterality Date  . Knee surgery      both knees-scopes  . Tonsillectomy    . Functional endoscopic sinus surgery      Deviated septum repair 10'13  . Vasectomy    . Eardrum      hx. perforated ear drum surgery right  . Total knee arthroplasty Bilateral 07/27/2012    Procedure: TOTAL KNEE BILATERAL;  Surgeon: Loanne Drilling, MD;  Location: [facility];  Service: Orthopedics;  Laterality: Bilateral;   Social History:  reports that he quit smoking about 15 years ago. He does not have any smokeless tobacco history on file. He reports that he does not drink alcohol or use illicit drugs.  Family / Support Systems Marital Status: Married Patient Roles: Spouse Spouse/Significant Other: Debbie-[phone]-home  [phone] Other Supports: Friendsand other family members Anticipated Caregiver: Wife Ability/Limitations of Caregiver: Wife works T, W, Th, off M & F Caregiver Availability: Other (Comment) (At times will be alone) Family Dynamics: Close  knit family who rely upon one another.  Wife has assisted pt before and they have plenty of support.  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  He has ankle issues now, which he did not before.  He is confident he will recover and do well here, just take time to heal. Recent Psychosocial Issues: Other medical issues Pyschiatric History: No issues-deferred depression screen due to felt not necessary.  Will monitor while here Substance Abuse History: No issues  Patient / Family Perceptions, Expectations & Goals Pt/Family understanding of illness & functional limitations: Pt is able to expalin his surgery and issues.  He is having more difficulty with his right ankle then his knees.  He is working through this. Premorbid pt/family roles/activities: Husband, Employee, Son, Friend, etc Anticipated changes in roles/activities/participation: resume Pt/family expectations/goals: Pt states: " I want to be able to move around on my own by the time I leave here."  [employer]: None Premorbid Home Care/DME Agencies: None Transportation available at discharge: Family  Discharge Planning Living Arrangements: Spouse/significant other Support Systems: Spouse/significant  other;Parent;Other relatives;Friends/neighbors;Church/faith community Type of Residence: Private  residence Insurance Resources: Media planner (specify) (Commerical Ins) Financial Resources: Employment;Family Support Financial Screen Referred: No Living Expenses: Lives with family Money Management: Patient;Spouse Do you have any problems obtaining your medications?: No Home Management: Wife Patient/Family Preliminary Plans: Return home with wife who can be there most of the time, except T, W,Th.  Pt needs to be mod/i level before returning home.  Aware will be short length of stay. Social Work Anticipated Follow Up Needs: HH/OP  Clinical Impression Pleasant motivated gentleman who is willing to work through the pain to recover from knee surgery.  Supportive wife who can assist.  Aware short length of stay here.  Lucy Chris 08/02/2012, 10:19 AM

## 2012-08-02 NOTE — Progress Notes (Signed)
Patient information reviewed and entered into eRehab system by Kashvi Prevette, RN, CRRN, PPS Coordinator.  Information including medical coding and functional independence measure will be reviewed and updated through discharge.    

## 2012-08-02 NOTE — Progress Notes (Signed)
Physical Therapy Note  Patient Details  Name: Rickey Anderson MRN: [id number] Date of Birth: 06-28-1955 Today's Date: 08/02/2012  1:05 - 1:40 35 minutes  Individual session Patient reports pain in right ankle is 7. Patient's left knee is red and warm. Patient's right calf is swollen - much larger than this morning. RN and PA notified of changes. Patient pending dopplers and x-ray today after therapy.  Treatment focused on gait and LE exercise. Patient ambulated 160 feet x 2 with rolling walker and supervision. Patient supine <> sit on mat with supervision. Patient performed active LE exercise x 12 reps including: quad sets, SLR's, SAQ's, and hip abduction. Patient with -10 degrees extension on right and -15 degrees extension on left. Patient left supine in bed with all items in reach.    Arelia Longest M 08/02/2012, 3:22 PM

## 2012-08-03 ENCOUNTER — Inpatient Hospital Stay (HOSPITAL_COMMUNITY): Payer: PRIVATE HEALTH INSURANCE | Admitting: Occupational Therapy

## 2012-08-03 ENCOUNTER — Inpatient Hospital Stay (HOSPITAL_COMMUNITY): Payer: PRIVATE HEALTH INSURANCE

## 2012-08-03 LAB — CBC
HCT: 23.3 % — ABNORMAL LOW (ref 39.0–52.0)
MCH: 30.3 pg (ref 26.0–34.0)
MCV: 88.3 fL (ref 78.0–100.0)
Platelets: 483 10*3/uL — ABNORMAL HIGH (ref 150–400)
RDW: 13.8 % (ref 11.5–15.5)

## 2012-08-03 LAB — PROTIME-INR
INR: 1.9 — ABNORMAL HIGH (ref 0.00–1.49)
Prothrombin Time: 21.1 seconds — ABNORMAL HIGH (ref 11.6–15.2)

## 2012-08-03 MED ORDER — DIPHENHYDRAMINE HCL 25 MG PO CAPS
25.0000 mg | ORAL_CAPSULE | Freq: Four times a day (QID) | ORAL | Status: DC | PRN
  Administered 2012-08-04: 25 mg via ORAL
  Filled 2012-08-03 (×2): qty 1

## 2012-08-03 MED ORDER — WARFARIN SODIUM 7.5 MG PO TABS
7.5000 mg | ORAL_TABLET | Freq: Once | ORAL | Status: AC
  Administered 2012-08-03: 7.5 mg via ORAL
  Filled 2012-08-03: qty 1

## 2012-08-03 MED ORDER — DIPHENHYDRAMINE HCL 12.5 MG/5ML PO ELIX
25.0000 mg | ORAL_SOLUTION | Freq: Four times a day (QID) | ORAL | Status: DC | PRN
  Administered 2012-08-03 – 2012-08-05 (×3): 25 mg via ORAL
  Filled 2012-08-03 (×4): qty 10

## 2012-08-03 MED ORDER — TRAZODONE HCL 50 MG PO TABS
25.0000 mg | ORAL_TABLET | Freq: Every evening | ORAL | Status: DC | PRN
  Filled 2012-08-03: qty 1

## 2012-08-03 NOTE — Progress Notes (Signed)
Inpatient RehabilitationTeam Conference and Plan of Care Update Date: 08/03/2012   Time: 10:43 AM    Patient Name: Rickey Anderson      Medical Record Number: [id number]  Date of Birth: 09-Apr-1955 Sex: Male         Room/Bed: 4142/4142-01 Payor Info: Payor: GENERIC COMMERCIAL / Plan: GENERIC COMMERCIAL / Product Type: *No Product type* /    Admitting Diagnosis: B TKR  CVA  Admit Date/Time:  08/01/2012  1:17 PM Admission Comments: No comment available   Primary Diagnosis:  S/P TKR (total knee replacement) Principal Problem: S/P TKR (total knee replacement)  Patient Active Problem List   Diagnosis Date Noted  . S/P TKR (total knee replacement)bilat 08/01/2012  . Postoperative anemia due to acute blood loss 07/29/2012  . OA (osteoarthritis) of knee 07/27/2012  . Chronic sinusitis 11/04/2011  . Nonspecific elevation of level of transaminase or lactic acid dehydrogenase (LDH) 11/04/2011  . HEMATURIA UNSPECIFIED 03/04/2009  . HYPERLIPIDEMIA 08/22/2007  . UNSPECIFIED PROSTATITIS 08/22/2007  . DIABETES MELLITUS, TYPE II 09/24/2006  . GERD 09/24/2006    Expected Discharge Date:  08/05/12 after am txs  Team Members Present:    Dr. Wynn Banker, Kriste Basque Dupree SW, Melanee Spry RN, [facility] OT, Fae Pippin ST, [place] PT, Tora Duck PPS Coordinator, Rosalio Loud OT, Perlie Mayo PT, Wanda Plump PT, Bretta Bang Supervisor, Bayard Hugger Director, Milly Jakob Rcom RN.        Current Status/Progress Goal Weekly Team Focus  Medical   Ankle pain right greater than left. X-ray showing loose bodies mild arthritis. No acute fracture or dislocation.  Improved pain management to increase tolerance for therapy  Adjust medication, Aircast right ankle. May need orbital followup at discharge   Bowel/Bladder   pt continent of bowel and bladder         Swallow/Nutrition/ Hydration             ADL's   mod assist with LB bathing and dressing, supervision with toilet transfers and toileting  mod  I   ADL retraining, functional mobility retraining, UE strengthening, pt education   Mobility   supervision to occasional min assist with mobility, ambulation  modified independent ambulation; min assist on steps with no rails  bilateral knee ROM and strengthening; ambulation; activity tolerance; stairs; patient/family education   Communication             Safety/Cognition/ Behavioral Observations            Pain   pt has severe pain in Right ankle(8-9 out of ten) with moderate pain to BL knees(4-5 out of 10) Ultram 50-100mg  q6hrs prn, robaxin 500mg  q6hrs prn, and tylenol 650mg  q4hrs prn. Ice packs as needed  4 out of 10  assess pain qshift and medicate as needed. Medicate prior to therpay   Skin   BL knee incisions with steris inplace; right lateral thigh has two filled blisters with tegaderm inplace; raised rash to lower back and upper thighs   no new skin breakdown while on rehab unit  assess skin qshift      *See Care Plan and progress notes for long and short-term goals.  Barriers to Discharge: Anemia, monitoring for orthostasis    Possible Resolutions to Barriers:  Monitor CBC transfuse as needed    Discharge Planning/Teaching Needs:  Home with wife who works so at times alone      Team Discussion:  B/B ok-pain managed-ambulating 160' close S-reaching goals-need to do family ed.-plan PT f/up after d/c.  Revisions to Treatment Plan:  none   Continued Need for Acute Rehabilitation Level of Care: The patient requires daily medical management by a physician with specialized training in physical medicine and rehabilitation for the following conditions: Daily direction of a multidisciplinary physical rehabilitation program to ensure safe treatment while eliciting the highest outcome that is of practical value to the patient.: Yes Daily medical management of patient stability for increased activity during participation in an intensive rehabilitation regime.: Yes Daily analysis of  laboratory values and/or radiology reports with any subsequent need for medication adjustment of medical intervention for : Post surgical problems;Other  Brock Ra 08/03/2012, 6:15 PM

## 2012-08-03 NOTE — Progress Notes (Signed)
Occupational Therapy Session Note  Patient Details  Name: Rickey Anderson MRN: [id number] Date of Birth: [birth year]  Today's Date: 08/03/2012 Time: 9528-4132 Time Calculation (min): 45 min  Short Term Goals: Week 1:  OT Short Term Goal 1 (Week 1): STGs = LTGs   Skilled Therapeutic Interventions/Progress Updates:    Pt performed simulated walk-in shower transfer with built up edge and RW with close supervision.  Pt plans to back into shower based on setup and place small seat in there for initial bathing.  Discussed seat options and pt will get a seat for use at home.  Discussed having his wife purchase a small lawn chair as well.  Also practiced bed mobility getting in and out of the regular double bed which he was also able to do with supervision.  Educated pt on need for walker bag for transporting items around the house and kitchen.  Pt reports that he will spend most of his time in his family room with his couch and folding table tray.  Family and friends will be bringing in meals so he does not plan on needing to perform any elaborate cooking.    Therapy Documentation Precautions:  Precautions Precautions: Knee;Fall Restrictions Weight Bearing Restrictions: Yes RLE Weight Bearing: Weight bearing as tolerated LLE Weight Bearing: Weight bearing as tolerated  Pain: Pain Assessment Pain Assessment: 0-10 (given tylenol) Pain Score:   5 Pain Type: Surgical pain Pain Location: Knee Pain Orientation: Right;Left Pain Descriptors: Aching Pain Frequency: Constant Pain Onset: On-going Patients Stated Pain Goal: 2 Pain Intervention(s): Medication (See eMAR) ADL: See FIM for current functional status  Therapy/Group: Individual Therapy  Decie Verne OTR/L 08/03/2012, 12:50 PM

## 2012-08-03 NOTE — Progress Notes (Signed)
Social Work Patient ID: Rickey Anderson, male   DOB: Jul 02, 1955, 57 y.o.   MRN: [id number] Pt requires a lightweight wheelchair to be able to self propel and uses for self care tasks.  He is unable to self propel a standard weight wheelchair.

## 2012-08-03 NOTE — Progress Notes (Signed)
Social Work Patient ID: Rickey Anderson, male   DOB: 1955-07-08, 57 y.o.   MRN: [id number] Met with pt and spoke with wife to discuss team conference goals-mod/i level and discharge 5/23.  Scheduled family education for Friday at 9:00. Discussed DME and follow up needs.  Pt to have Gentiva until off blood thinner.  Pleased with how well he is doing, ankle still the bigger issue.

## 2012-08-03 NOTE — Progress Notes (Signed)
Patient ID: Rickey Anderson, male   DOB: [birth year], 57 y.o.   MRN: [id number] Subjective/Complaints: 57 y.o. right-handed male admitted 07/27/2012 with end stage changes bilateral knees secondary to osteoarthritis and no relief with conservative care. Patient with noted procedures on the knees include arthroscopy and meniscectomy. Underwent bilateral total knee arthroplasty 07/27/2012 per Dr. Despina Hick. Placed on Coumadin for DVT prophylaxis and weightbearing as tolerated bilateral lower extremities. Postoperative pain management. Acute blood loss anemia Physical and occupational therapy evaluations completed an ongoing  Right ankle pain improved with Aircast. Uric acid level normal  Review of Systems  Musculoskeletal: Positive for joint pain.       Both knees and Right ankle  All other systems reviewed and are negative.    Objective: Vital Signs: Blood pressure 124/76, pulse 92, temperature 97.8 F (36.6 C), temperature source Oral, resp. rate 16, height 5\' 7"  (1.702 m), weight 106.459 kg (234 lb 11.2 oz), SpO2 91.00%. Dg Ankle 2 Views Right  08/02/2012   *RADIOLOGY REPORT*  Clinical Data: Right ankle pain and swelling.  No known injury.  RIGHT ANKLE - 2 VIEW  Comparison: None.  Findings: There is no fracture or dislocation.  There is slight arthritis of the ankle joint with several small loose bodies in the ankle joint.  No other abnormality.  IMPRESSION: Small loose bodies in the ankle joint with minimal arthritic changes.   Original Report Authenticated By: Francene Boyers, M.D.   Results for orders placed during the hospital encounter of 08/01/12 (from the past 72 hour(s))  GLUCOSE, CAPILLARY     Status: Abnormal   Collection Time    08/01/12  4:30 PM      Result Value Range   Glucose-Capillary 161 (*) 70 - 99 mg/dL  GLUCOSE, CAPILLARY     Status: Abnormal   Collection Time    08/01/12  8:40 PM      Result Value Range   Glucose-Capillary 121 (*) 70 - 99 mg/dL   Comment 1 Notify RN     CBC WITH DIFFERENTIAL     Status: Abnormal   Collection Time    08/02/12  6:12 AM      Result Value Range   WBC 8.3  4.0 - 10.5 K/uL   RBC 2.50 (*) 4.22 - 5.81 MIL/uL   Hemoglobin 7.5 (*) 13.0 - 17.0 g/dL   HCT 82.9 (*) 56.2 - 13.0 %   MCV 87.6  78.0 - 100.0 fL   MCH 30.0  26.0 - 34.0 pg   MCHC 34.2  30.0 - 36.0 g/dL   RDW 86.5  78.4 - 69.6 %   Platelets 322  150 - 400 K/uL   Neutrophils Relative % 64  43 - 77 %   Neutro Abs 5.3  1.7 - 7.7 K/uL   Lymphocytes Relative 19  12 - 46 %   Lymphs Abs 1.5  0.7 - 4.0 K/uL   Monocytes Relative 10  3 - 12 %   Monocytes Absolute 0.8  0.1 - 1.0 K/uL   Eosinophils Relative 6 (*) 0 - 5 %   Eosinophils Absolute 0.5  0.0 - 0.7 K/uL   Basophils Relative 1  0 - 1 %   Basophils Absolute 0.1  0.0 - 0.1 K/uL  COMPREHENSIVE METABOLIC PANEL     Status: Abnormal   Collection Time    08/02/12  6:12 AM      Result Value Range   Sodium 135  135 - 145 mEq/L   Potassium 3.9  3.5 - 5.1 mEq/L   Chloride 94 (*) 96 - 112 mEq/L   CO2 30  19 - 32 mEq/L   Glucose, Bld 135 (*) 70 - 99 mg/dL   BUN 11  6 - 23 mg/dL   Creatinine, Ser 9.56  0.50 - 1.35 mg/dL   Calcium 9.0  8.4 - 21.3 mg/dL   Total Protein 6.2  6.0 - 8.3 g/dL   Albumin 2.1 (*) 3.5 - 5.2 g/dL   AST 32  0 - 37 U/L   ALT 49  0 - 53 U/L   Alkaline Phosphatase 225 (*) 39 - 117 U/L   Total Bilirubin 0.7  0.3 - 1.2 mg/dL   GFR calc non Af Amer >90  >90 mL/min   GFR calc Af Amer >90  >90 mL/min   Comment:            The eGFR has been calculated     using the CKD EPI equation.     This calculation has not been     validated in all clinical     situations.     eGFR's persistently     <90 mL/min signify     possible Chronic Kidney Disease.  PROTIME-INR     Status: Abnormal   Collection Time    08/02/12  6:12 AM      Result Value Range   Prothrombin Time 19.3 (*) 11.6 - 15.2 seconds   INR 1.69 (*) 0.00 - 1.49  URIC ACID     Status: Abnormal   Collection Time    08/02/12  6:12 AM      Result  Value Range   Uric Acid, Serum 3.5 (*) 4.0 - 7.8 mg/dL  GLUCOSE, CAPILLARY     Status: Abnormal   Collection Time    08/02/12  7:24 AM      Result Value Range   Glucose-Capillary 128 (*) 70 - 99 mg/dL   Comment 1 Notify RN    GLUCOSE, CAPILLARY     Status: Abnormal   Collection Time    08/02/12 11:24 AM      Result Value Range   Glucose-Capillary 141 (*) 70 - 99 mg/dL  GLUCOSE, CAPILLARY     Status: Abnormal   Collection Time    08/02/12  4:29 PM      Result Value Range   Glucose-Capillary 140 (*) 70 - 99 mg/dL  GLUCOSE, CAPILLARY     Status: None   Collection Time    08/02/12  8:45 PM      Result Value Range   Glucose-Capillary 92  70 - 99 mg/dL  PROTIME-INR     Status: Abnormal   Collection Time    08/03/12  5:51 AM      Result Value Range   Prothrombin Time 21.1 (*) 11.6 - 15.2 seconds   INR 1.90 (*) 0.00 - 1.49  GLUCOSE, CAPILLARY     Status: Abnormal   Collection Time    08/03/12  7:20 AM      Result Value Range   Glucose-Capillary 106 (*) 70 - 99 mg/dL      HEENT: normal Cardio: RRR and no murmur Resp: CTA B/L and unlabored GI: BS positive and non distended Extremity:  Pulses positive and Edema pre tib Skin:   Wound C/D/I Neuro: Alert/Oriented, Cranial Nerve II-XII normal, Normal Sensory and Abnormal Motor 5/5 in BUE, 3-/5 B HF and KE, R ankle DF/PF, 4/5 L ankle DF/PF Musc/Skel:  Normal and Extremity tender Bilateral  knees and R lateral ankle below malleolus Gen: NAD   Assessment/Plan: 1. Functional deficits secondary to Bilateral Knee enstage OA, S/P B TKR which require 3+ hours per day of interdisciplinary therapy in a comprehensive inpatient rehab setting. Physiatrist is providing close team supervision and 24 hour management of active medical problems listed below. Physiatrist and rehab team continue to assess barriers to discharge/monitor patient progress toward functional and medical goals. FIM: FIM - Bathing Bathing Steps Patient Completed:  Chest;Right Arm;Left Arm;Abdomen;Front perineal area;Buttocks;Right upper leg;Left upper leg Bathing: 4: Min-Patient completes 8-9 34f 10 parts or 75+ percent  FIM - Upper Body Dressing/Undressing Upper body dressing/undressing steps patient completed: Thread/unthread right sleeve of pullover shirt/dresss;Thread/unthread left sleeve of pullover shirt/dress;Put head through opening of pull over shirt/dress;Pull shirt over trunk Upper body dressing/undressing: 5: Set-up assist to: Obtain clothing/put away FIM - Lower Body Dressing/Undressing Lower body dressing/undressing steps patient completed: Thread/unthread right pants leg;Thread/unthread left pants leg;Pull pants up/down (declined underware today) Lower body dressing/undressing: 3: Mod-Patient completed 50-74% of tasks  FIM - Toileting Toileting steps completed by patient: Adjust clothing prior to toileting;Performs perineal hygiene;Adjust clothing after toileting Toileting Assistive Devices: Grab bar or rail for support Toileting: 5: Supervision: Safety issues/verbal cues  FIM - Diplomatic Services operational officer Devices: Elevated toilet seat;Walker;Grab bars Toilet Transfers: 5-To toilet/BSC: Supervision (verbal cues/safety issues);5-From toilet/BSC: Supervision (verbal cues/safety issues)  FIM - Press photographer Assistive Devices: Bed rails;HOB elevated Bed/Chair Transfer: 5: Supine > Sit: Supervision (verbal cues/safety issues);5: Sit > Supine: Supervision (verbal cues/safety issues);5: Bed > Chair or W/C: Supervision (verbal cues/safety issues);5: Chair or W/C > Bed: Supervision (verbal cues/safety issues)  FIM - Locomotion: Ambulation Locomotion: Ambulation Assistive Devices: Walker - Rolling Ambulation/Gait Assistance: 5: Supervision Locomotion: Ambulation: 5: Travels 150 ft or more with supervision/safety issues  Comprehension Comprehension Mode: Auditory Comprehension: 7-Follows complex  conversation/direction: With no assist  Expression Expression Mode: Verbal Expression: 7-Expresses complex ideas: With no assist  Social Interaction Social Interaction Mode: Asleep Social Interaction: 6-Interacts appropriately with others with medication or extra time (anti-anxiety, antidepressant).  Problem Solving Problem Solving: 6-Solves complex problems: With extra time  Memory Memory: 6-Assistive device: No helper Medical Problem List and Plan:  1. 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 DVT Prophylaxis/Anticoagulation: Coumadin for DVT prophylaxis. Check Doppler studies. Latest INR 1.52. Continue Lovenox until INR greater than 2.00  3. Pain Management: Ultram 50-100 mg every 6 hours as needed and Robaxin. Monitor with increased mobility  4. Neuropsych: This patient is capable of making decisions on his own behalf.  5. Acute blood loss anemia. Latest hemoglobin 7.5 orthostatic vitals ok, monitor for symptoms during therapy transfuse 2 U PRBCs if needed 6. Non-insulin-dependent diabetes mellitus. Latest hemoglobin A1c of 7.6. Amaryl 2 mg twice a day,Victoza 1.2 mg subcutaneous daily, Glucophage 1000 mg twice a day. Check blood sugars a.c. and at bedtime. Adjust regimen as needed  7. GERD. Prilosec  8.swelling and pain R lateral ankle, hx of ankle sprain in past Uric acid is negative, check x ray, aircast, ice after therapy  LOS (Days) 2 A FACE TO FACE EVALUATION WAS PERFORMED  Evetta Renner E 08/03/2012, 7:40 AM

## 2012-08-03 NOTE — Progress Notes (Signed)
ANTICOAGULATION CONSULT NOTE - Follow Up Consult  Pharmacy Consult for coumadin Indication: VTE prophylaxis  No Known Allergies  Patient Measurements: Height: 5\' 7"  (170.2 cm) Weight: 234 lb 11.2 oz (106.459 kg) IBW/kg (Calculated) : 66.1 Heparin Dosing Weight:   Vital Signs: Temp: 97.8 F (36.6 C) (05/21 0500) Temp src: Oral (05/21 0500) BP: 124/76 mmHg (05/21 0503) Pulse Rate: 92 (05/21 0503)  Labs:  Recent Labs  08/01/12 0446 08/02/12 0612 08/03/12 0551  HGB  --  7.5*  --   HCT  --  21.9*  --   PLT  --  322  --   LABPROT 17.9* 19.3* 21.1*  INR 1.52* 1.69* 1.90*  CREATININE  --  0.74  --     Estimated Creatinine Clearance: 120 ml/min (by C-G formula based on Cr of 0.74).   Medications:  Scheduled:  . enoxaparin (LOVENOX) injection  30 mg Subcutaneous Q12H  . feeding supplement  120 mL Oral QID  . ferrous fumarate-b12-vitamic C-folic acid  1 capsule Oral TID PC  . glimepiride  2 mg Oral BID AC  . insulin aspart  0-15 Units Subcutaneous TID WC  . Liraglutide  1.2 mg Subcutaneous q1800  . metFORMIN  1,000 mg Oral BID WC  . omeprazole  20 mg Oral Daily  . Warfarin - Pharmacist Dosing Inpatient   Does not apply q1800   Infusions:    Assessment: [name] is currently on subtherapeutic coumadin.  INR up to 1.90.  On lovenox 30mg  sq q12h.  Goal of Therapy:  INR 2-3 Monitor platelets by anticoagulation protocol: Yes   Plan:  1) Coumadin 7.5 mg po x 1 tonight.  2) Daily PT/INR  3) Continue Lovenox 30 mg sq q12h until INR therapeutic 4) Watch CBC   Wendie Simmer, PharmD, BCPS Clinical Pharmacist  Pager: [phone]

## 2012-08-03 NOTE — Progress Notes (Signed)
Occupational Therapy Session Note  Patient Details  Name: JALIEL DEAVERS MRN: [id number] Date of Birth: 01-Aug-1955  Today's Date: 08/03/2012 Time: 0905-1005 Time Calculation (min): 60 min  Short Term Goals: No short term goals set  Skilled Therapeutic Interventions/Progress Updates:      Pt seen for BADL retraining of toileting, bathing at shower level, and dressing with a focus on use of AE (long sponge and sock aid) and functional mobility. Pt was supervision with all mobility, mod I with toileting and bathing, mod assist with LB dressing to don right sock and ted hose.  Pt is progressing rapidly and with need a short LOS.  Therapy Documentation Precautions:  Precautions Precautions: Knee;Fall Restrictions Weight Bearing Restrictions: Yes RLE Weight Bearing: Weight bearing as tolerated LLE Weight Bearing: Weight bearing as tolerated   Pain: Pain Assessment Pain Assessment: 0-10 Pain Score:   5 Pain Type: Surgical pain;Acute pain Pain Location:  (bil knees and R ankle) Pain Orientation: Right Pain Descriptors: Aching Pain Frequency: Constant Pain Onset: With Activity Patients Stated Pain Goal: 2 Pain Intervention(s): Medication (See eMAR);RN made aware ADL:  See FIM for current functional status  Therapy/Group: Individual Therapy  Marguerita Stapp 08/03/2012, 11:45 AM

## 2012-08-03 NOTE — Progress Notes (Signed)
Physical Therapy Session Note  Patient Details  Name: Rickey Anderson MRN: [id number] Date of Birth: 1955-07-13  Today's Date: 08/03/2012 Time: 0800-0900 and [id number] Time Calculation (min): 60 min and 35 min  Short Term Goals: Week 1:  PT Short Term Goal 1 (Week 1): STG's = LTG's due to LOS  Skilled Therapeutic Interventions/Progress Updates:  treatment 1: treatment focused on therapeutic exercise performed with LE and trunk to increase strength for functional mobility: bil heel slides, ankle pumps, straight leg raises, hip extension biased toward gluts, in sidelying, abdominal crunches., bed mobility, education transfers, gait.   Bed mobility mod I using bed rail.  Transfer with mod assist bed> RW.  Gait wearing R air cast,  x 130', x 165' with close supervision/min assist, focusing on upright trunk, forward gaze, decreasing wt bearing on bil hands. Up/down curb with RW with min guard assist, no cues needed for sequence after 1 demo.  Instructed pt in icing bil knees after tx.  Discussed best shoes for ankle and knee stability, hip/knee/ankle alignment when in bed, sitting EOB and during gait and life long fitness.  Session 2:  Pain 5/10 bil knees, R ankle; meds given at start of tx. R ankle erythematous bil malleoli, tender ant. tib tendon.  Treatment focused on gait training and therapeutic exercise,  performed with trunk and LE to increase strength for functional mobility.  Gait to/from room 150' x 2 with RW, cues for safe hand placement sit>< stand, supervision.  Up/down 5 steps with 2 rails, alternating feet ascending and descending, supervision.   LE flexibility in sitting: knee flexion with pillow case under feet to decrease friction; trunk shortening/lengthening/rotation/eccentric extension during reaching with either hand out of BOS in sitting, kicking light wt ball in sitting; calf raises in standing, mini squats in standing with bil UE support.  Upon return to room,  ice packs applied to bil knees, R ankle.      Therapy Documentation Precautions:  Precautions Precautions: Knee;Fall Restrictions Weight Bearing Restrictions: Yes RLE Weight Bearing: Weight bearing as tolerated LLE Weight Bearing: Weight bearing as tolerated   Pain: Pain Assessment Pain Assessment: 0-10 Pain Score:   5 Pain Type: Surgical pain;Acute pain Pain Location:  (bil knees and R ankle) Pain Orientation: Right;Left;Other (Comment) (and right ankle) Pain Descriptors: Aching Pain Frequency: Constant Pain Onset: With Activity Patients Stated Pain Goal: 2 Pain Intervention(s): Medication (See eMAR);RN made aware       See FIM for current functional status  Therapy/Group: Individual Therapy  [name] 08/03/2012, 9:40 AM

## 2012-08-04 ENCOUNTER — Inpatient Hospital Stay (HOSPITAL_COMMUNITY): Payer: PRIVATE HEALTH INSURANCE

## 2012-08-04 ENCOUNTER — Inpatient Hospital Stay (HOSPITAL_COMMUNITY): Payer: PRIVATE HEALTH INSURANCE | Admitting: Occupational Therapy

## 2012-08-04 ENCOUNTER — Encounter (HOSPITAL_COMMUNITY): Payer: PRIVATE HEALTH INSURANCE | Admitting: Occupational Therapy

## 2012-08-04 ENCOUNTER — Inpatient Hospital Stay (HOSPITAL_COMMUNITY): Payer: PRIVATE HEALTH INSURANCE | Admitting: Physical Therapy

## 2012-08-04 DIAGNOSIS — D62 Acute posthemorrhagic anemia: Secondary | ICD-10-CM

## 2012-08-04 DIAGNOSIS — S78119A Complete traumatic amputation at level between unspecified hip and knee, initial encounter: Secondary | ICD-10-CM

## 2012-08-04 DIAGNOSIS — M169 Osteoarthritis of hip, unspecified: Secondary | ICD-10-CM

## 2012-08-04 LAB — GLUCOSE, CAPILLARY
Glucose-Capillary: 97 mg/dL (ref 70–99)
Glucose-Capillary: 105 mg/dL — ABNORMAL HIGH (ref 70–99)
Glucose-Capillary: 84 mg/dL (ref 70–99)
Glucose-Capillary: 86 mg/dL (ref 70–99)

## 2012-08-04 MED ORDER — WARFARIN SODIUM 10 MG PO TABS
10.0000 mg | ORAL_TABLET | Freq: Once | ORAL | Status: AC
  Administered 2012-08-04: 10 mg via ORAL
  Filled 2012-08-04: qty 1

## 2012-08-04 NOTE — Progress Notes (Signed)
Physical Therapy Note  Patient Details  Name: Rickey Anderson MRN: [id number] Date of Birth: 10-09-1955 Today's Date: 08/04/2012  Time: 1430-1500 30 minutes  1:1 Pt c/o B knee pain, RN made aware and meds rec'd.  Pt able to gait with RW 150' x 2 with mod I.  Treatment focused on increasing B knee ROM through functional tasks.  Pt performed step up/downs on 6'' step multiple attempts with B UE support with focus on preventing hip abduction/circumduction to increase knee flexion.  Stepping over obstacles with focus on decreasing circumduction.  AAROM knee flexion and extension with slight overpressure in tolerable range. Pt very motivated to improve.  DONAWERTH,KAREN 08/04/2012, 5:14 PM

## 2012-08-04 NOTE — Progress Notes (Signed)
Occupational Therapy Session Note  Patient Details  Name: KELTIN BAIRD MRN: [id number] Date of Birth: 27-Jul-1955  Today's Date: 08/04/2012 Time: 2130-8657 Time Calculation (min): 45 min  Short Term Goals: Week 1:  OT Short Term Goal 1 (Week 1): STGs = LTGs   Skilled Therapeutic Interventions/Progress Updates:    Pt seen for 1:1 OT with focus on functional mobility in home environment with RW.  Pt ambulated to ADL apt with distant supervision and RW.  Pt reports concerns regarding simple meal prep and transporting meal from kitchen to couch.  Introduced pt to rolling kitchen cart to assist with transporting items.  Pt return demonstrated safety with retrieving items from refrigerator and transporting items with rolling cart.  Pt reports plan to purchase or borrow a similar type of cart to assist.    Therapy Documentation Precautions:  Precautions Precautions: Knee;Fall Restrictions Weight Bearing Restrictions: Yes RLE Weight Bearing: Weight bearing as tolerated LLE Weight Bearing: Weight bearing as tolerated General:   Vital Signs: Therapy Vitals Temp: 97.5 F (36.4 C) Temp src: Oral Pulse Rate: 88 Resp: 17 BP: 126/80 mmHg Patient Position, if appropriate: Sitting Oxygen Therapy SpO2: 97 % O2 Device: None (Room air) Pain: Pain Assessment Pain Assessment: 0-10 Pain Score:   6 Pain Type: Surgical pain Pain Location: Knee Pain Orientation: Right;Left Pain Descriptors: Aching Pain Frequency: Intermittent Pain Onset: On-going Patients Stated Pain Goal: 3 Pain Intervention(s): Medication (See eMAR)  See FIM for current functional status  Therapy/Group: Individual Therapy  Leonette Monarch 08/04/2012, 3:38 PM

## 2012-08-04 NOTE — Progress Notes (Signed)
ANTICOAGULATION CONSULT NOTE - Follow Up Consult  Pharmacy Consult for coumadin Indication: VTE prophylaxis  No Known Allergies  Patient Measurements: Height: 5\' 7"  (170.2 cm) Weight: 234 lb 11.2 oz (106.459 kg) IBW/kg (Calculated) : 66.1 Heparin Dosing Weight:   Vital Signs: Temp: 97.8 F (36.6 C) (05/22 0515) Temp src: Oral (05/22 0515) BP: 128/85 mmHg (05/22 0518) Pulse Rate: 84 (05/22 0518)  Labs:  Recent Labs  08/02/12 0612 08/03/12 0551 08/03/12 1135 08/04/12 0739  HGB 7.5*  --  8.0*  --   HCT 21.9*  --  23.3*  --   PLT 322  --  483*  --   LABPROT 19.3* 21.1*  --  20.2*  INR 1.69* 1.90*  --  1.79*  CREATININE 0.74  --   --   --     Estimated Creatinine Clearance: 120 ml/min (by C-G formula based on Cr of 0.74).   Medications:  Scheduled:  . enoxaparin (LOVENOX) injection  30 mg Subcutaneous Q12H  . feeding supplement  120 mL Oral QID  . ferrous fumarate-b12-vitamic C-folic acid  1 capsule Oral TID PC  . glimepiride  2 mg Oral BID AC  . insulin aspart  0-15 Units Subcutaneous TID WC  . Liraglutide  1.2 mg Subcutaneous q1800  . metFORMIN  1,000 mg Oral BID WC  . omeprazole  20 mg Oral Daily  . Warfarin - Pharmacist Dosing Inpatient   Does not apply q1800   Infusions:    Assessment: [name] is currently on subtherapeutic coumadin.  INR down to 1.79 from 1.9 after few doses of coumadin 7.5mg .  Also on lovenox 30mg  sq q12h. Goal of Therapy:  INR 2-3 Monitor platelets by anticoagulation protocol: Yes   Plan:  1) Coumadin 10mg  po x1.  Patient will probably require coumadin 10mg  few days a week and the rest 7.5mg   2) INR in am  Nakeita Styles, Tsz-Yin 08/04/2012,8:35 AM

## 2012-08-04 NOTE — Progress Notes (Signed)
Patient ID: Rickey Anderson, male   DOB: 02-21-1956, 57 y.o.   MRN: [id number] Subjective/Complaints: 57 y.o. right-handed male admitted 07/27/2012 with end stage changes bilateral knees secondary to osteoarthritis and no relief with conservative care. Patient with noted procedures on the knees include arthroscopy and meniscectomy. Underwent bilateral total knee arthroplasty 07/27/2012 per Dr. Despina Hick. Placed on Coumadin for DVT prophylaxis and weightbearing as tolerated bilateral lower extremities. Postoperative pain management. Acute blood loss anemia Physical and occupational therapy evaluations completed an ongoing  Right ankle pain improved with Aircast. Uric acid level normal, some bruising noted  Review of Systems  Musculoskeletal: Positive for joint pain.       Both knees and Right ankle  All other systems reviewed and are negative.    Objective: Vital Signs: Blood pressure 128/85, pulse 84, temperature 97.8 F (36.6 C), temperature source Oral, resp. rate 17, height 5\' 7"  (1.702 m), weight 106.459 kg (234 lb 11.2 oz), SpO2 94.00%. Dg Ankle 2 Views Right  08/02/2012   *RADIOLOGY REPORT*  Clinical Data: Right ankle pain and swelling.  No known injury.  RIGHT ANKLE - 2 VIEW  Comparison: None.  Findings: There is no fracture or dislocation.  There is slight arthritis of the ankle joint with several small loose bodies in the ankle joint.  No other abnormality.  IMPRESSION: Small loose bodies in the ankle joint with minimal arthritic changes.   Original Report Authenticated By: Francene Boyers, M.D.   Results for orders placed during the hospital encounter of 08/01/12 (from the past 72 hour(s))  GLUCOSE, CAPILLARY     Status: Abnormal   Collection Time    08/01/12  4:30 PM      Result Value Range   Glucose-Capillary 161 (*) 70 - 99 mg/dL  GLUCOSE, CAPILLARY     Status: Abnormal   Collection Time    08/01/12  8:40 PM      Result Value Range   Glucose-Capillary 121 (*) 70 - 99 mg/dL   Comment 1 Notify RN    CBC WITH DIFFERENTIAL     Status: Abnormal   Collection Time    08/02/12  6:12 AM      Result Value Range   WBC 8.3  4.0 - 10.5 K/uL   RBC 2.50 (*) 4.22 - 5.81 MIL/uL   Hemoglobin 7.5 (*) 13.0 - 17.0 g/dL   HCT 95.2 (*) 84.1 - 32.4 %   MCV 87.6  78.0 - 100.0 fL   MCH 30.0  26.0 - 34.0 pg   MCHC 34.2  30.0 - 36.0 g/dL   RDW 40.1  02.7 - 25.3 %   Platelets 322  150 - 400 K/uL   Neutrophils Relative % 64  43 - 77 %   Neutro Abs 5.3  1.7 - 7.7 K/uL   Lymphocytes Relative 19  12 - 46 %   Lymphs Abs 1.5  0.7 - 4.0 K/uL   Monocytes Relative 10  3 - 12 %   Monocytes Absolute 0.8  0.1 - 1.0 K/uL   Eosinophils Relative 6 (*) 0 - 5 %   Eosinophils Absolute 0.5  0.0 - 0.7 K/uL   Basophils Relative 1  0 - 1 %   Basophils Absolute 0.1  0.0 - 0.1 K/uL  COMPREHENSIVE METABOLIC PANEL     Status: Abnormal   Collection Time    08/02/12  6:12 AM      Result Value Range   Sodium 135  135 - 145 mEq/L   Potassium  3.9  3.5 - 5.1 mEq/L   Chloride 94 (*) 96 - 112 mEq/L   CO2 30  19 - 32 mEq/L   Glucose, Bld 135 (*) 70 - 99 mg/dL   BUN 11  6 - 23 mg/dL   Creatinine, Ser 7.82  0.50 - 1.35 mg/dL   Calcium 9.0  8.4 - 95.6 mg/dL   Total Protein 6.2  6.0 - 8.3 g/dL   Albumin 2.1 (*) 3.5 - 5.2 g/dL   AST 32  0 - 37 U/L   ALT 49  0 - 53 U/L   Alkaline Phosphatase 225 (*) 39 - 117 U/L   Total Bilirubin 0.7  0.3 - 1.2 mg/dL   GFR calc non Af Amer >90  >90 mL/min   GFR calc Af Amer >90  >90 mL/min   Comment:            The eGFR has been calculated     using the CKD EPI equation.     This calculation has not been     validated in all clinical     situations.     eGFR's persistently     <90 mL/min signify     possible Chronic Kidney Disease.  PROTIME-INR     Status: Abnormal   Collection Time    08/02/12  6:12 AM      Result Value Range   Prothrombin Time 19.3 (*) 11.6 - 15.2 seconds   INR 1.69 (*) 0.00 - 1.49  URIC ACID     Status: Abnormal   Collection Time    08/02/12   6:12 AM      Result Value Range   Uric Acid, Serum 3.5 (*) 4.0 - 7.8 mg/dL  GLUCOSE, CAPILLARY     Status: Abnormal   Collection Time    08/02/12  7:24 AM      Result Value Range   Glucose-Capillary 128 (*) 70 - 99 mg/dL   Comment 1 Notify RN    GLUCOSE, CAPILLARY     Status: Abnormal   Collection Time    08/02/12 11:24 AM      Result Value Range   Glucose-Capillary 141 (*) 70 - 99 mg/dL  GLUCOSE, CAPILLARY     Status: Abnormal   Collection Time    08/02/12  4:29 PM      Result Value Range   Glucose-Capillary 140 (*) 70 - 99 mg/dL  GLUCOSE, CAPILLARY     Status: None   Collection Time    08/02/12  8:45 PM      Result Value Range   Glucose-Capillary 92  70 - 99 mg/dL  PROTIME-INR     Status: Abnormal   Collection Time    08/03/12  5:51 AM      Result Value Range   Prothrombin Time 21.1 (*) 11.6 - 15.2 seconds   INR 1.90 (*) 0.00 - 1.49  GLUCOSE, CAPILLARY     Status: Abnormal   Collection Time    08/03/12  7:20 AM      Result Value Range   Glucose-Capillary 106 (*) 70 - 99 mg/dL  GLUCOSE, CAPILLARY     Status: Abnormal   Collection Time    08/03/12 11:29 AM      Result Value Range   Glucose-Capillary 128 (*) 70 - 99 mg/dL  CBC     Status: Abnormal   Collection Time    08/03/12 11:35 AM      Result Value Range   WBC 10.8 (*)  4.0 - 10.5 K/uL   RBC 2.64 (*) 4.22 - 5.81 MIL/uL   Hemoglobin 8.0 (*) 13.0 - 17.0 g/dL   HCT 84.6 (*) 96.2 - 95.2 %   MCV 88.3  78.0 - 100.0 fL   MCH 30.3  26.0 - 34.0 pg   MCHC 34.3  30.0 - 36.0 g/dL   RDW 84.1  32.4 - 40.1 %   Platelets 483 (*) 150 - 400 K/uL   Comment: REPEATED TO VERIFY  GLUCOSE, CAPILLARY     Status: Abnormal   Collection Time    08/03/12  5:08 PM      Result Value Range   Glucose-Capillary 133 (*) 70 - 99 mg/dL   Comment 1 Notify RN    GLUCOSE, CAPILLARY     Status: None   Collection Time    08/03/12  8:42 PM      Result Value Range   Glucose-Capillary 97  70 - 99 mg/dL  GLUCOSE, CAPILLARY     Status: Abnormal    Collection Time    08/04/12  6:24 AM      Result Value Range   Glucose-Capillary 105 (*) 70 - 99 mg/dL   Comment 1 Notify RN        HEENT: normal Cardio: RRR and no murmur Resp: CTA B/L and unlabored GI: BS positive and non distended Extremity:  Pulses positive and Edema pre tib Skin:   Wound C/D/I Neuro: Alert/Oriented, Cranial Nerve II-XII normal, Normal Sensory and Abnormal Motor 5/5 in BUE, 3-/5 B HF and KE, R ankle DF/PF, 4/5 L ankle DF/PF Musc/Skel:  Normal and Extremity tender Bilateral knees and R lateral ankle below malleolus Gen: NAD   Assessment/Plan: 1. Functional deficits secondary to Bilateral Knee enstage OA, S/P B TKR which require 3+ hours per day of interdisciplinary therapy in a comprehensive inpatient rehab setting. Physiatrist is providing close team supervision and 24 hour management of active medical problems listed below. Physiatrist and rehab team continue to assess barriers to discharge/monitor patient progress toward functional and medical goals. FIM: FIM - Bathing Bathing Steps Patient Completed: Chest;Right Arm;Left Arm;Abdomen;Left upper leg;Right upper leg;Buttocks;Front perineal area;Right lower leg (including foot);Left lower leg (including foot) (uses long sponge) Bathing: 6: Assistive device (Comment) (long sponge and shower chair)  FIM - Upper Body Dressing/Undressing Upper body dressing/undressing steps patient completed: Thread/unthread right sleeve of pullover shirt/dresss;Thread/unthread left sleeve of pullover shirt/dress;Put head through opening of pull over shirt/dress;Pull shirt over trunk Upper body dressing/undressing: 5: Set-up assist to: Obtain clothing/put away FIM - Lower Body Dressing/Undressing Lower body dressing/undressing steps patient completed: Thread/unthread right pants leg;Thread/unthread left pants leg;Pull pants up/down;Don/Doff right sock (pt reports that he does not wear underwear, uses sock aid) Lower body  dressing/undressing: 3: Mod-Patient completed 50-74% of tasks  FIM - Toileting Toileting steps completed by patient: Adjust clothing prior to toileting;Performs perineal hygiene;Adjust clothing after toileting Toileting Assistive Devices: Grab bar or rail for support Toileting: 7: Independent: No helper, no device  FIM - Diplomatic Services operational officer Devices: Elevated toilet seat;Walker;Grab bars Toilet Transfers: 5-To toilet/BSC: Supervision (verbal cues/safety issues);5-From toilet/BSC: Supervision (verbal cues/safety issues)  FIM - Banker Devices: Bed rails Bed/Chair Transfer: 5: Supine > Sit: Supervision (verbal cues/safety issues);5: Sit > Supine: Supervision (verbal cues/safety issues);5: Bed > Chair or W/C: Supervision (verbal cues/safety issues);5: Chair or W/C > Bed: Supervision (verbal cues/safety issues)  FIM - Locomotion: Wheelchair Locomotion: Wheelchair: 0: Activity did not occur FIM - Locomotion: Ambulation Locomotion: Ambulation  Assistive Devices: Orthosis;Walker - Rolling (r ankle air cast) Ambulation/Gait Assistance: 4: Min guard Locomotion: Ambulation: 4: Travels 150 ft or more with minimal assistance (Pt.>75%)  Comprehension Comprehension Mode: Auditory Comprehension: 7-Follows complex conversation/direction: With no assist  Expression Expression Mode: Verbal Expression: 7-Expresses complex ideas: With no assist  Social Interaction Social Interaction Mode: Asleep Social Interaction: 6-Interacts appropriately with others with medication or extra time (anti-anxiety, antidepressant).  Problem Solving Problem Solving: 6-Solves complex problems: With extra time  Memory Memory: 6-Assistive device: No helper Medical Problem List and Plan:  1. 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 DVT Prophylaxis/Anticoagulation: Coumadin for DVT prophylaxis. Check Doppler studies. Latest INR 1.52.  Continue Lovenox until INR greater than 2.00  3. Pain Management: Ultram 50-100 mg every 6 hours as needed and Robaxin. Monitor with increased mobility  4. Neuropsych: This patient is capable of making decisions on his own behalf.  5. Acute blood loss anemia. Latest hemoglobin 7.5 orthostatic vitals ok, monitor for symptoms during therapy transfuse 2 U PRBCs if needed 6. Non-insulin-dependent diabetes mellitus. Latest hemoglobin A1c of 7.6. Amaryl 2 mg twice a day,Victoza 1.2 mg subcutaneous daily, Glucophage 1000 mg twice a day. Check blood sugars a.c. and at bedtime. Adjust regimen as needed  7. GERD. Prilosec  8.R ankle sprain swelling and pain R lateral ankle, hx of ankle sprain in past Uric acid is negative, check x ray, aircast, ice after therapy  LOS (Days) 3 A FACE TO FACE EVALUATION WAS PERFORMED  Jaquelynn Wanamaker E 08/04/2012, 7:24 AM

## 2012-08-04 NOTE — Discharge Summary (Signed)
  Discharge summary job [id number]

## 2012-08-04 NOTE — Progress Notes (Signed)
Occupational Therapy Session Note  Patient Details  Name: GAINES CARTMELL MRN: [id number] Date of Birth: 1955-06-06  Today's Date: 08/04/2012 Time: 1100-1155 Time Calculation (min): 55 min  Short Term Goals: Week 1:  OT Short Term Goal 1 (Week 1): STGs = LTGs   Skilled Therapeutic Interventions/Progress Updates:    1:1 self care retraining at shower level with focus on standing balance without UE support, activity tolerance, functional ambulation with safe use of RW, more practice with AE for LB dressing and bathing. Pt needed one verbal cue to use RW correctly.  Therapy Documentation Precautions:  Precautions Precautions: Knee;Fall Restrictions Weight Bearing Restrictions: Yes RLE Weight Bearing: Weight bearing as tolerated LLE Weight Bearing: Weight bearing as tolerated Pain: Pain Assessment Pain Assessment: No/denies pain Pain Score: 0-No pain  See FIM for current functional status  Therapy/Group: Individual Therapy  Roney Mans [facility] 08/04/2012, 11:57 AM

## 2012-08-04 NOTE — Discharge Summary (Signed)
Rickey Anderson, Rickey Anderson NO.:  [id number]  MEDICAL RECORD NO.:  [id number]  LOCATION:  4142                         FACILITY:  [facility]  PHYSICIAN:  Erick Colace, M.D.DATE OF BIRTH:  1956/02/22  DATE OF ADMISSION:  08/01/2012 DATE OF DISCHARGE:  08/05/2012                              DISCHARGE SUMMARY   DISCHARGE DIAGNOSES: 1. Bilateral total knee replacement secondary to end-stage     osteoarthritis. 2. Coumadin for deep vein thrombosis prophylaxis. 3. Pain management. 4. Acute blood loss anemia. 5. Non-insulin-dependent diabetes mellitus. 6. Gastroesophageal reflux disease. 7. Right lateral ankle pain-osteoarthritis.  HISTORY OF PRESENT ILLNESS:  This is a 57 year old right-handed male admitted on Jul 27, 2012, with end-stage changes of both knees secondary to osteoarthritis and no relief with conservative care, underwent bilateral total knee arthroplasty on Jul 27, 2012, per Dr. Lequita Halt. Placed on Coumadin for DVT prophylaxis.  Weightbearing as tolerated. Postoperative pain management as well as acute blood loss anemia 8.6 and monitored.  Physical and occupational therapy ongoing.  The patient was admitted for comprehensive rehab program.  PAST MEDICAL HISTORY:  See discharge diagnoses.  SOCIAL HISTORY:  Lives with spouse, one level home.  Functional history prior to admission independent working.  Functional status upon admission to Rehab Services was moderate assist to ambulate 5 feet with the rolling walker.  PHYSICAL EXAMINATION:  VITAL SIGNS:  Blood pressure 130/77, pulse 102, temperature 99, respirations 20. GENERAL:  This was an alert male, in no acute distress, oriented x3. HEENT:  Pupils are round and reactive to light. LUNGS:  Clear to auscultation. CARDIAC:  Regular rate and rhythm. ABDOMEN:  Soft, nontender.  Good bowel sounds. EXTREMITIES:  Bilateral knee incisions with Steri-Strips in place.  REHABILITATION HOSPITAL COURSE:  The  patient was admitted to Inpatient Rehab Services with therapies initiated on a 3-hour daily basis consisting of physical and occupational therapy.  The following issues were addressed during the patient's rehabilitation stay.  Pertaining to Mr. Emminger's bilateral total knee replacement on Jul 27, 2012, remained stable.  Surgical site is healing nicely.  Neurovascular sensation intact.  The patient was weightbearing as tolerated utilizing CPM machines and would follow up with Dr. Lequita Halt.  The patient remained on Coumadin for DVT prophylaxis as well as subcutaneous Lovenox to INR greater than 2.00.  Venous Doppler studies negative for DVT.  Pain management ongoing with the use of Ultram and good results.  Acute blood loss anemia.  Iron supplement added.  Patient asymptomatic.  Latest hemoglobin of 8.0 and monitored.  He did have a history of non-insulin- dependent diabetes mellitus with latest hemoglobin A1c of 7.6.  The patient remained on Glucophage 1000 mg twice daily as well as Amaryl 2 mg twice daily and Victoza subcutaneous daily with blood sugars within acceptable limits.  The patient with ongoing complaints of right ankle pain.  There was some consideration of gout which was ruled out.  X-ray showed small loose bodies in the ankle, felt to be arthritic changes and air cast was applied for conservative care.  The patient continued to progress nicely.  The patient received weekly collaborative interdisciplinary team conferences to discuss estimated length of stay, family teaching, and any barriers to  discharge.  He was ambulating 165 feet close supervision to minimal assist focusing on upright trunk and forward gait using a rolling walker.  He continued to progress quite nicely, occupational therapy, needing minimal assist for lower body socks, modified independent for toileting, bathing, and lower body dressing.  Ongoing therapies would be dictated as per [facility] and the  patient was discharged to home.  DISCHARGE MEDICATIONS:  Included Trinsicon 1 capsule b.i.d., Amaryl 2 mg p.o. b.i.d., Liraglutide 1.2 mg subcutaneously daily, Glucophage 1000 mg p.o. b.i.d., Robaxin 500 mg p.o. every 6 h. as needed muscle spasms, Prilosec 20 mg daily, Ultram 50-100 mg p.o. every 6 h. as needed for pain, Coumadin 7.5 mg daily adjusted accordingly for an INR of 2.0-3.0 with Coumadin to be completed on August 27, 2012, and home health nurse had been arranged.  FOLLOWUP:  [facility]  Dr. Ollen Gross, Orthopedic Services 2 weeks call for appointment.  Followup medical management as directed.     Rickey Anderson, P.A.   ______________________________ Erick Colace, M.D.    DA/MEDQ  D:  08/04/2012  T:  08/04/2012  Job:  [id number]  cc:   Ollen Gross, M.D. Stacie Glaze, MD

## 2012-08-04 NOTE — Progress Notes (Signed)
Physical Therapy Session Note  Patient Details  Name: Rickey Anderson MRN: [id number] Date of Birth: [birth year]  Today's Date: 08/04/2012 Time: 0800-0900 Time Calculation (min): 60 min  Short Term Goals: Week 1:  PT Short Term Goal 1 (Week 1): STG's = LTG's due to LOS  Skilled Therapeutic Interventions/Progress Updates:  Treatment focused on flexibility,therapeutic exercise performed with trunk and LEs to increase strength for functional mobility, advanced gait.   AROM for increasing bil knee ROM: R knee 78 degrees, L knee 78 degrees; L knee has improved.  Gait on level tile with rw x 150' and up/down ramp and curb with supervision.  Up/down flight of 10 steps with 2 rails, min guard assist, alternating when ascending, non-alternating when descending.  Sitting balance/trunk activation in sitting, reaching out of BOS in either directions; 8# Medicine ball for PNF diagonals in sitting.  Balance retraining with calf raises, toe raises; accepting external perturbations in standing, demonstrating ankle, hip and stepping strategies.   Family ed planned for tomorrow, primarily for steps.  After another therapist saw pt for afternoon therapy, pt made modified independent in room.    Therapy Documentation Precautions:  Precautions Precautions: Knee;Fall Restrictions Weight Bearing Restrictions: Yes RLE Weight Bearing: Weight bearing as tolerated LLE Weight Bearing: Weight bearing as tolerated   Pain: Pain Assessment Pain Assessment: 0-10 Pain Score:  5 Pain Type: Surgical pain Pain Location: Knee Pain Orientation: Right;Left Pain Descriptors: Aching Pain Frequency: Intermittent Pain Onset: On-going Patients Stated Pain Goal: 3 Pain Intervention(s): Medication (See eMAR)   Locomotion : Ambulation Ambulation/Gait Assistance: 5: Supervision     See FIM for current functional status  Therapy/Group: Individual Therapy  Rickey Anderson 08/04/2012, 2:53 PM

## 2012-08-05 ENCOUNTER — Inpatient Hospital Stay (HOSPITAL_COMMUNITY): Payer: PRIVATE HEALTH INSURANCE

## 2012-08-05 ENCOUNTER — Inpatient Hospital Stay (HOSPITAL_COMMUNITY): Payer: PRIVATE HEALTH INSURANCE | Admitting: Occupational Therapy

## 2012-08-05 DIAGNOSIS — D62 Acute posthemorrhagic anemia: Secondary | ICD-10-CM

## 2012-08-05 DIAGNOSIS — S78119A Complete traumatic amputation at level between unspecified hip and knee, initial encounter: Secondary | ICD-10-CM

## 2012-08-05 DIAGNOSIS — M169 Osteoarthritis of hip, unspecified: Secondary | ICD-10-CM

## 2012-08-05 LAB — PROTIME-INR
INR: 2.04 — ABNORMAL HIGH (ref 0.00–1.49)
Prothrombin Time: 22.2 seconds — ABNORMAL HIGH (ref 11.6–15.2)

## 2012-08-05 MED ORDER — FE FUMARATE-B12-VIT C-FA-IFC PO CAPS: 1.0000 | ORAL_CAPSULE | Freq: Three times a day (TID) | ORAL | Status: DC

## 2012-08-05 MED ORDER — METFORMIN HCL 1000 MG PO TABS: 1000.0000 mg | ORAL_TABLET | Freq: Two times a day (BID) | ORAL | Status: DC

## 2012-08-05 MED ORDER — METHOCARBAMOL 500 MG PO TABS: 500.0000 mg | ORAL_TABLET | Freq: Four times a day (QID) | ORAL | Status: DC | PRN

## 2012-08-05 MED ORDER — WARFARIN SODIUM 7.5 MG PO TABS: 7.5000 mg | ORAL_TABLET | Freq: Every day | ORAL | Status: DC

## 2012-08-05 MED ORDER — WARFARIN SODIUM 7.5 MG PO TABS
7.5000 mg | ORAL_TABLET | Freq: Every day | ORAL | Status: DC
  Administered 2012-08-05: 7.5 mg via ORAL
  Filled 2012-08-05: qty 1

## 2012-08-05 MED ORDER — WARFARIN - PHYSICIAN DOSING INPATIENT: Freq: Every day | Status: DC

## 2012-08-05 MED ORDER — TRAMADOL HCL 50 MG PO TABS: 50.0000 mg | ORAL_TABLET | Freq: Four times a day (QID) | ORAL | Status: DC | PRN

## 2012-08-05 MED ORDER — GLIMEPIRIDE 2 MG PO TABS: 2.0000 mg | ORAL_TABLET | Freq: Two times a day (BID) | ORAL | Status: DC

## 2012-08-05 MED ORDER — OMEPRAZOLE 20 MG PO CPDR: 20.0000 mg | DELAYED_RELEASE_CAPSULE | Freq: Every day | ORAL | Status: AC

## 2012-08-05 NOTE — Progress Notes (Signed)
Occupational Therapy Discharge Summary  Patient Details  Name: ZEUS MARQUIS MRN: [id number] Date of Birth: [birth year]  Today's Date: 08/05/2012    Patient has met 6 of 6 long term goals due to improved activity tolerance, improved balance and ability to compensate for deficits.  Patient to discharge at overall Modified Independent level.  Patient's care partner is independent to provide the necessary physical assistance at discharge.    Reasons goals not met: n/a  Recommendation: no further OT services needed  Equipment: long sponge provided, pt will need a sock aid and shoe horn.  Reasons for discharge: treatment goals met  Patient/family agrees with progress made and goals achieved: Yes  OT Discharge  ADL  mod I with BADL and simple meal prep  Vision/Perception  Vision - History Patient Visual Report: No change from baseline Vision - Assessment Eye Alignment: Within Functional Limits Perception Perception: Within Functional Limits Praxis Praxis: Intact  Cognition Overall Cognitive Status: Within Functional Limits for tasks assessed Sensation Sensation Light Touch: Appears Intact Stereognosis: Appears Intact Hot/Cold: Appears Intact Proprioception: Appears Intact Coordination Gross Motor Movements are Fluid and Coordinated: Yes Fine Motor Movements are Fluid and Coordinated: Yes Motor  Motor Motor: Within Functional Limits Mobility  Transfers Sit to Stand: 6: Modified independent (Device/Increase time) Stand to Sit: 6: Modified independent (Device/Increase time)  Trunk/Postural Assessment  Cervical Assessment Cervical Assessment: Within Functional Limits Thoracic Assessment Thoracic Assessment: Within Functional Limits Lumbar Assessment Lumbar Assessment: Within Functional Limits Postural Control Postural Control: Within Functional Limits  Balance Dynamic Standing Balance Dynamic Standing - Level of Assistance: 6: Modified independent  (Device/Increase time) Extremity/Trunk Assessment RUE Assessment RUE Assessment: Within Functional Limits LUE Assessment LUE Assessment: Within Functional Limits  See FIM for current functional status  Nel Stoneking 08/05/2012, 8:24 AM

## 2012-08-05 NOTE — Progress Notes (Signed)
Discharge to home accompanied by wife. Discharge instructions given by PA no questions noted. Rickey Anderson

## 2012-08-05 NOTE — Progress Notes (Signed)
Physical Therapy Discharge Summary  Patient Details  Name: Rickey Anderson MRN: [id number] Date of Birth: 1956-01-26  Today's Date: 08/05/2012 Time: 7829-5621 Time Calculation (min): 35 min  Patient has met 8 of 8 long term goals due to improved activity tolerance, improved balance, increased strength, increased range of motion and decreased pain.  Patient to discharge at an ambulatory level Modified Independent.   Patient's care partner observed patient demonstrate modified independence with mobility, car transfers, and stairs. Wife instructed to stand in front of patient going up and down stairs with standard walker for safety. Patient performed modified independent today which may be different in home setting.  Treatment session focused on patient/family education. Patient modified independent with ambulation 200+ feet. Patient performed car transfer with supervision. Patient ambulated up and down 2 steps with no rail and standard walker with supervision. Wife instructed to stand in front of patient for safety. Patient reviewed home exercise program. Discussed home safety and continued activity at home post discharge. Patient and wife report having no further questions.  Reasons goals not met: N/a all goals met except wheelchair mobility goals that were discontinued.  Recommendation:  Patient will benefit from ongoing skilled PT services in home health setting to continue to advance safe functional mobility, address ongoing impairments in bilateral knee ROM and strength, and minimize fall risk.  Equipment: standard walker; wheelchair and cushion for long distance in community setting  Reasons for discharge: treatment goals met and discharge from hospital  Patient/family agrees with progress made and goals achieved: Yes  PT Discharge Precautions/Restrictions Precautions Precautions: Knee Restrictions Weight Bearing Restrictions: No RLE Weight Bearing: Weight bearing as  tolerated LLE Weight Bearing: Weight bearing as tolerated Pain Pain Assessment Pain Assessment: 0-10 Pain Score:   5 Pain Type: Surgical pain Pain Location: Knee Pain Orientation: Right;Left Pain Descriptors: Aching Pain Onset: On-going Patients Stated Pain Goal: 4 Pain Intervention(s): Medication (See eMAR) Vision/Perception  Vision - History Baseline Vision: Wears glasses only for reading Patient Visual Report: No change from baseline Vision - Assessment Eye Alignment: Within Functional Limits Perception Perception: Within Functional Limits Praxis Praxis: Intact  Cognition Overall Cognitive Status: Within Functional Limits for tasks assessed Arousal/Alertness: Awake/alert Orientation Level: Oriented X4 Sensation Sensation Light Touch: Appears Intact Stereognosis: Appears Intact Hot/Cold: Appears Intact Proprioception: Appears Intact Coordination Gross Motor Movements are Fluid and Coordinated: Yes Fine Motor Movements are Fluid and Coordinated: Yes Motor  Motor Motor: Within Functional Limits  Mobility Bed Mobility Supine to Sit: 7: Independent Sit to Supine: 7: Independent Transfers Sit to Stand: 6: Modified independent (Device/Increase time) Stand to Sit: 6: Modified independent (Device/Increase time) Locomotion  Ambulation Ambulation/Gait Assistance: 6: Modified independent (Device/Increase time) Ambulation Distance (Feet): 200 Feet Assistive device: Rolling walker Gait Gait: Yes Gait Pattern: Step-through pattern;Trunk flexed;Wide base of support;Decreased hip/knee flexion - right;Decreased hip/knee flexion - left Stairs / Additional Locomotion Stairs: Yes Stairs Assistance: 6: Modified independent (Device/Increase time) Stair Management Technique: With walker;Step to pattern;Backwards;No rails Number of Stairs: 4 Height of Stairs: 4 Curb: 6: Modified independent (Device/increase time)  Trunk/Postural Assessment  Cervical Assessment Cervical  Assessment: Within Functional Limits Thoracic Assessment Thoracic Assessment: Within Functional Limits Lumbar Assessment Lumbar Assessment: Within Functional Limits Postural Control Postural Control: Within Functional Limits  Balance Dynamic Standing Balance Dynamic Standing - Level of Assistance: 6: Modified independent (Device/Increase time) Extremity Assessment  RUE Assessment RUE Assessment: Within Functional Limits LUE Assessment LUE Assessment: Within Functional Limits RLE AROM (degrees) RLE Overall AROM Comments: -10 - 80 degrees  ROM right knee RLE Strength RLE Overall Strength Comments: hip flexion 4/5; knee flexion/extension and ankle DF 4+/5 LLE AROM (degrees) LLE Overall AROM Comments: -15 - 77 degrees ROM left knee LLE Strength LLE Overall Strength Comments: grossly hip flexion  knee flexion/ext and ankle DF 4+/5  See FIM for current functional status  Arelia Longest M 08/05/2012, 9:44 AM

## 2012-08-05 NOTE — Progress Notes (Signed)
Occupational Therapy Session Note  Patient Details  Name: Rickey Anderson MRN: [id number] Date of Birth: 12-Jun-1955  Today's Date: 08/05/2012 Time: 4098-1191 Time Calculation (min): 40 min  Short Term Goals: No short term goals set  Skilled Therapeutic Interventions/Progress Updates:    Pt is going home today.  Pt's physical therapist cleared pt to be mod I in his room.  Pt had his room and supplies set up to initiate bathing and dressing. He was able to complete all toileting, bathing, dressing, and grooming at a mod I level. He was even able to don his Ted hose using the sock aid. Pt will purchase a hip kit today so he will have sock aide, shoe horn.  Pt has met his goals and is ready for discharge.  Therapy Documentation Precautions:  Precautions Precautions: Knee;Fall Restrictions Weight Bearing Restrictions: No RLE Weight Bearing: Weight bearing as tolerated LLE Weight Bearing: Weight bearing as tolerated  Pain: No pain in knees, pain in ankle - uses ankle brace, premedicated   ADL:  See FIM for current functional status  Therapy/Group: Individual Therapy  SAGUIER,JULIA 08/05/2012, 9:12 AM

## 2012-08-05 NOTE — Progress Notes (Signed)
Social Work Discharge Note Discharge Note  The overall goal for the admission was met for:   Discharge location: Yes-HOME WITH WIFE-ALONE AT TIMES  Length of Stay: Yes-5 DAYS  Discharge activity level: Yes-MOD/I LEVEL  Home/community participation: Yes  Services provided included: MD, RD, PT, OT, RN, Pharmacy and SW  Financial Services: Private Insurance: Personnel officer  Follow-up services arranged: Home Health: [facility], DME: [facility], WHEELCHAIR and Patient/Family request agency HH: [facility], DME: [facility]  Comments (or additional information):FAMILY EDUCATION COMPLETED TODAY AND BOTH COMFORTABLE WITH DISCHARGE [facility] TO ARRANGE HOME CPM'S  Patient/Family verbalized understanding of follow-up arrangements: Yes  Individual responsible for coordination of the follow-up plan: SELF & DEBBIE-WIFE  Confirmed correct DME delivered: Lucy Chris 08/05/2012    Lucy Chris

## 2012-08-05 NOTE — Progress Notes (Signed)
Patient ID: Rickey Anderson, male   DOB: [birth year], 57 y.o.   MRN: [id number] Subjective/Complaints: 57 y.o. right-handed male admitted 07/27/2012 with end stage changes bilateral knees secondary to osteoarthritis and no relief with conservative care. Patient with noted procedures on the knees include arthroscopy and meniscectomy. Underwent bilateral total knee arthroplasty 07/27/2012 per Dr. Despina Hick. Placed on Coumadin for DVT prophylaxis and weightbearing as tolerated bilateral lower extremities. Postoperative pain management. Acute blood loss anemia Physical and occupational therapy evaluations completed an ongoing  Right ankle tolerable.  Review of Systems  Musculoskeletal: Positive for joint pain.       Both knees and Right ankle  All other systems reviewed and are negative.    Objective: Vital Signs: Blood pressure 137/87, pulse 87, temperature 97.9 F (36.6 C), temperature source Oral, resp. rate 18, height 5\' 7"  (1.702 m), weight 106.459 kg (234 lb 11.2 oz), SpO2 97.00%. No results found. Results for orders placed during the hospital encounter of 08/01/12 (from the past 72 hour(s))  GLUCOSE, CAPILLARY     Status: Abnormal   Collection Time    08/02/12 11:24 AM      Result Value Range   Glucose-Capillary 141 (*) 70 - 99 mg/dL  GLUCOSE, CAPILLARY     Status: Abnormal   Collection Time    08/02/12  4:29 PM      Result Value Range   Glucose-Capillary 140 (*) 70 - 99 mg/dL  GLUCOSE, CAPILLARY     Status: None   Collection Time    08/02/12  8:45 PM      Result Value Range   Glucose-Capillary 92  70 - 99 mg/dL  PROTIME-INR     Status: Abnormal   Collection Time    08/03/12  5:51 AM      Result Value Range   Prothrombin Time 21.1 (*) 11.6 - 15.2 seconds   INR 1.90 (*) 0.00 - 1.49  GLUCOSE, CAPILLARY     Status: Abnormal   Collection Time    08/03/12  7:20 AM      Result Value Range   Glucose-Capillary 106 (*) 70 - 99 mg/dL  GLUCOSE, CAPILLARY     Status: Abnormal   Collection Time    08/03/12 11:29 AM      Result Value Range   Glucose-Capillary 128 (*) 70 - 99 mg/dL  CBC     Status: Abnormal   Collection Time    08/03/12 11:35 AM      Result Value Range   WBC 10.8 (*) 4.0 - 10.5 K/uL   RBC 2.64 (*) 4.22 - 5.81 MIL/uL   Hemoglobin 8.0 (*) 13.0 - 17.0 g/dL   HCT 56.2 (*) 13.0 - 86.5 %   MCV 88.3  78.0 - 100.0 fL   MCH 30.3  26.0 - 34.0 pg   MCHC 34.3  30.0 - 36.0 g/dL   RDW 78.4  69.6 - 29.5 %   Platelets 483 (*) 150 - 400 K/uL   Comment: REPEATED TO VERIFY  GLUCOSE, CAPILLARY     Status: Abnormal   Collection Time    08/03/12  5:08 PM      Result Value Range   Glucose-Capillary 133 (*) 70 - 99 mg/dL   Comment 1 Notify RN    GLUCOSE, CAPILLARY     Status: None   Collection Time    08/03/12  8:42 PM      Result Value Range   Glucose-Capillary 97  70 - 99 mg/dL  GLUCOSE, CAPILLARY  Status: Abnormal   Collection Time    08/04/12  6:24 AM      Result Value Range   Glucose-Capillary 105 (*) 70 - 99 mg/dL   Comment 1 Notify RN    PROTIME-INR     Status: Abnormal   Collection Time    08/04/12  7:39 AM      Result Value Range   Prothrombin Time 20.2 (*) 11.6 - 15.2 seconds   INR 1.79 (*) 0.00 - 1.49  GLUCOSE, CAPILLARY     Status: Abnormal   Collection Time    08/04/12 11:18 AM      Result Value Range   Glucose-Capillary 110 (*) 70 - 99 mg/dL   Comment 1 Notify RN    GLUCOSE, CAPILLARY     Status: None   Collection Time    08/04/12  4:45 PM      Result Value Range   Glucose-Capillary 84  70 - 99 mg/dL  GLUCOSE, CAPILLARY     Status: None   Collection Time    08/04/12  9:04 PM      Result Value Range   Glucose-Capillary 86  70 - 99 mg/dL  PROTIME-INR     Status: Abnormal   Collection Time    08/05/12  6:00 AM      Result Value Range   Prothrombin Time 22.2 (*) 11.6 - 15.2 seconds   INR 2.04 (*) 0.00 - 1.49  GLUCOSE, CAPILLARY     Status: Abnormal   Collection Time    08/05/12  7:08 AM      Result Value Range    Glucose-Capillary 116 (*) 70 - 99 mg/dL   Comment 1 Notify RN        HEENT: normal Cardio: RRR and no murmur Resp: CTA B/L and unlabored GI: BS positive and non distended Extremity:  Pulses positive and Edema pre tib Skin:   Wound C/D/I Neuro: Alert/Oriented, Cranial Nerve II-XII normal, Normal Sensory and Abnormal Motor 5/5 in BUE, 3-/5 B HF and KE, R ankle DF/PF, 4/5 L ankle DF/PF Musc/Skel:  Normal and Extremity tender Bilateral knees and R lateral ankle below malleolus Gen: NAD Knee ROM essentially 80 degrees on both sides  Assessment/Plan: 1. Functional deficits secondary to Bilateral Knee enstage OA, S/P B TKR which require 3+ hours per day of interdisciplinary therapy in a comprehensive inpatient rehab setting. Physiatrist is providing close team supervision and 24 hour management of active medical problems listed below. Physiatrist and rehab team continue to assess barriers to discharge/monitor patient progress toward functional and medical goals.  Dc home today. Needs home cpm  FIM: FIM - Bathing Bathing Steps Patient Completed: Chest;Right Arm;Left Arm;Abdomen;Left upper leg;Right upper leg;Buttocks;Front perineal area;Right lower leg (including foot);Left lower leg (including foot) (uses long sponge) Bathing: 6: Assistive device (Comment) (long sponge and shower chair)  FIM - Upper Body Dressing/Undressing Upper body dressing/undressing steps patient completed: Thread/unthread right sleeve of pullover shirt/dresss;Thread/unthread left sleeve of pullover shirt/dress;Put head through opening of pull over shirt/dress;Pull shirt over trunk Upper body dressing/undressing: 5: Set-up assist to: Obtain clothing/put away FIM - Lower Body Dressing/Undressing Lower body dressing/undressing steps patient completed: Thread/unthread right pants leg;Thread/unthread left pants leg;Pull pants up/down;Don/Doff right sock (pt reports that he does not wear underwear, uses sock aid) Lower body  dressing/undressing: 3: Mod-Patient completed 50-74% of tasks  FIM - Toileting Toileting steps completed by patient: Adjust clothing prior to toileting;Performs perineal hygiene;Adjust clothing after toileting Toileting Assistive Devices: Grab bar or rail for support  Toileting: 7: Independent: No helper, no device  FIM - Diplomatic Services operational officer Devices: Elevated toilet seat;Walker;Grab bars Toilet Transfers: 5-To toilet/BSC: Supervision (verbal cues/safety issues);5-From toilet/BSC: Supervision (verbal cues/safety issues)  FIM - Banker Devices: Therapist, occupational: 5: Bed > Chair or W/C: Supervision (verbal cues/safety issues);5: Chair or W/C > Bed: Supervision (verbal cues/safety issues)  FIM - Locomotion: Wheelchair Locomotion: Wheelchair: 0: Activity did not occur FIM - Locomotion: Ambulation Locomotion: Ambulation Assistive Devices: Walker - Rolling;Orthosis (R ankle air cast) Ambulation/Gait Assistance: 5: Supervision Locomotion: Ambulation: 5: Travels 150 ft or more with supervision/safety issues  Comprehension Comprehension Mode: Auditory Comprehension: 7-Follows complex conversation/direction: With no assist  Expression Expression Mode: Verbal Expression: 7-Expresses complex ideas: With no assist  Social Interaction Social Interaction Mode: Asleep Social Interaction: 6-Interacts appropriately with others with medication or extra time (anti-anxiety, antidepressant).  Problem Solving Problem Solving: 6-Solves complex problems: With extra time  Memory Memory: 6-Assistive device: No helper  Medical Problem List and Plan:  1. 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 DVT Prophylaxis/Anticoagulation: Coumadin for DVT prophylaxis. Check Doppler studies. Latest INR 1.52. Continue Lovenox until INR greater than 2.00  3. Pain Management: Ultram 50-100 mg every 6 hours as needed and  Robaxin. Monitor with increased mobility  4. Neuropsych: This patient is capable of making decisions on his own behalf.  5. Acute blood loss anemia. Latest hemoglobin 7.5 orthostatic vitals ok, monitor for symptoms during therapy transfuse 2 U PRBCs if needed 6. Non-insulin-dependent diabetes mellitus. Latest hemoglobin A1c of 7.6. Amaryl 2 mg twice a day,Victoza 1.2 mg subcutaneous daily, Glucophage 1000 mg twice a day. Check blood sugars a.c. and at bedtime. Adjust regimen as needed  7. GERD. Prilosec  8.R ankle sprain swelling and pain R lateral ankle, hx of ankle sprain in past Uric acid is negative, check x ray, aircast, ice after therapy  LOS (Days) 4 A FACE TO FACE EVALUATION WAS PERFORMED  Nadene Witherspoon T 08/05/2012, 9:05 AM

## 2012-08-18 ENCOUNTER — Telehealth: Payer: Self-pay | Admitting: Internal Medicine

## 2012-08-18 NOTE — Telephone Encounter (Signed)
Patient Information:  Caller Name: Rickey Anderson  Phone: [phone]  Patient: Rickey Anderson, Rickey Anderson  Gender: Male  DOB: January 04, 1956  Age: 57 Years  PCP: Darryll Capers (Adults only)  Office Follow Up:  Does the office need to follow up with this patient?: No  Instructions For The Office: N/A  RN Note:  States nurses have been coming to check him since he is post op, and diastolic BP has been registering high in the 90's, which is high for him, but has been slightly increasing, and today was 160/100.  Feels well.  Taking robaxin, oxycodone post op.  Per blood pressure elevation protocol, emergent symptoms currently denied; advised appt within 2 weeks.  No appts available within 24 hours with Dr. Lovell Sheehan; caller transferred to office to schedule appt.  krs/can  Symptoms  Reason For Call & Symptoms: Hypertension.  Had bilateral knee replacement 3 weeks ago.  Reviewed Health History In EMR: Yes  Reviewed Medications In EMR: Yes  Reviewed Allergies In EMR: Yes  Reviewed Surgeries / Procedures: Yes  Date of Onset of Symptoms: 08/18/2012  Guideline(s) Used:  High Blood Pressure  Disposition Per Guideline:   See Within 2 Weeks in Office  Reason For Disposition Reached:   BP > 160/100  Advice Given:  N/A  Patient Will Follow Care Advice:  YES

## 2012-08-18 NOTE — Telephone Encounter (Signed)
This says no follow up is needed with the pt and that he was transferred to make an appt.  I do not see where an appt has been made.  Let me know if I need to do anything.

## 2012-08-19 ENCOUNTER — Encounter: Payer: Self-pay | Admitting: Internal Medicine

## 2012-08-19 ENCOUNTER — Telehealth: Payer: Self-pay | Admitting: Internal Medicine

## 2012-08-19 NOTE — Telephone Encounter (Signed)
[facility] worker calling to state: pt had knee surgery and is home now but is on short term coumadin therapy for the surgery. He starts outpt PT Monday. Once that starts, [facility] has to pull out so they will not be providing services. Therefore, his coumadin therapy will have to be at our office - not through [facility]. Pt's pt/INR today: 20.1 and 1.7. He is on coumadin 11.25mg  daily. Pt states that pt is due for pt/INR check on Mon or Tues. Please call and advise. Oran Rein has never done his coumadin - since it is only for surgery.

## 2012-08-19 NOTE — Telephone Encounter (Signed)
Left message on machine For gentiva ,joyce, and for pt to call surgeon and ask if it is not time to stop taking coumadin since it is has been 3 weeks since surgery

## 2012-08-19 NOTE — Telephone Encounter (Signed)
Usually at this point, patient will discontinue Coumadin. I would suggest he call the surgeon just to be sure that is fine to discontinue.

## 2012-08-19 NOTE — Telephone Encounter (Signed)
He needs f/u and you can make with padonda--he need for bp being elevated plus his blood sugar check

## 2012-08-19 NOTE — Telephone Encounter (Signed)
appt made/kh 

## 2012-09-09 ENCOUNTER — Encounter: Payer: Self-pay | Admitting: Family

## 2012-09-09 ENCOUNTER — Ambulatory Visit (INDEPENDENT_AMBULATORY_CARE_PROVIDER_SITE_OTHER): Payer: PRIVATE HEALTH INSURANCE | Admitting: Family

## 2012-09-09 VITALS — BP 126/84 | HR 97 | Wt 222.0 lb

## 2012-09-09 DIAGNOSIS — E7800 Pure hypercholesterolemia, unspecified: Secondary | ICD-10-CM

## 2012-09-09 DIAGNOSIS — E1149 Type 2 diabetes mellitus with other diabetic neurological complication: Secondary | ICD-10-CM

## 2012-09-09 NOTE — Progress Notes (Signed)
Subjective:    Patient ID: Rickey Anderson, male    DOB: 04-11-1955, 57 y.o.   MRN: [id number]  HPI  57 year old white male, nonsmoker, patient of Dr. Lovell Sheehan status post bilateral knee replacement  Is in today with concerns of elevated blood pressure. Reports checking his blood pressure at home and it's been in the 150s over 90s. Denies any lightheadedness, dizziness, chest pain, palpitations, shortness of breath or edema.  Review of Systems  Constitutional: Negative.   HENT: Negative.   Respiratory: Negative.   Cardiovascular: Negative.   Gastrointestinal: Negative.   Genitourinary: Negative.   Musculoskeletal: Negative.   Skin: Negative.   Neurological: Negative.   Hematological: Negative.   Psychiatric/Behavioral: Negative.    Past Medical History  Diagnosis Date  . GERD (gastroesophageal reflux disease)   . Diabetes mellitus   . Hyperlipidemia   . Herpes   . Norovirus     1'14- no reoccurring issues    History   Social History  . Marital Status: Married    Spouse Name: N/A    Number of Children: N/A  . Years of Education: N/A   Occupational History  . Not on file.   Social History Main Topics  . Smoking status: Former Smoker    Quit date: 07/18/1997  . Smokeless tobacco: Not on file  . Alcohol Use: No  . Drug Use: No  . Sexually Active: Yes    Birth Control/ Protection: None   Other Topics Concern  . Not on file   Social History Narrative  . No narrative on file    Past Surgical History  Procedure Laterality Date  . Knee surgery      both knees-scopes  . Tonsillectomy    . Functional endoscopic sinus surgery      Deviated septum repair 10'13  . Vasectomy    . Eardrum      hx. perforated ear drum surgery right  . Total knee arthroplasty Bilateral 07/27/2012    Procedure: TOTAL KNEE BILATERAL;  Surgeon: Loanne Drilling, MD;  Location: [facility];  Service: Orthopedics;  Laterality: Bilateral;    Family History  Problem Relation Age of Onset   . Hypertension    . Heart disease    . Alcohol abuse    . Depression      No Known Allergies  Current Outpatient Prescriptions on File Prior to Visit  Medication Sig Dispense Refill  . acetaminophen (TYLENOL) 325 MG tablet Take 2 tablets (650 mg total) by mouth every 6 (six) hours as needed.  60 tablet  0  . glimepiride (AMARYL) 2 MG tablet Take 1 tablet (2 mg total) by mouth 2 (two) times daily.  180 tablet  3  . Liraglutide (VICTOZA) 18 MG/3ML SOLN injection Inject 0.2 mLs (1.2 mg total) into the skin daily.  36 mL  3  . metFORMIN (GLUCOPHAGE) 1000 MG tablet Take 1 tablet (1,000 mg total) by mouth 2 (two) times daily with a meal.  60 tablet  11  . methocarbamol (ROBAXIN) 500 MG tablet Take 1 tablet (500 mg total) by mouth every 6 (six) hours as needed.  90 tablet  0  . omeprazole (PRILOSEC) 20 MG capsule Take 1 capsule (20 mg total) by mouth daily.  30 capsule  1  . traMADol (ULTRAM) 50 MG tablet Take 1-2 tablets (50-100 mg total) by mouth every 6 (six) hours as needed.  90 tablet  0  . ferrous fumarate-b12-vitamic C-folic acid (TRINSICON / FOLTRIN) capsule Take 1 capsule  by mouth 3 (three) times daily after meals.  90 capsule  0  . warfarin (COUMADIN) 7.5 MG tablet Take 1 tablet (7.5 mg total) by mouth daily at 6 PM.  30 tablet  0   No current facility-administered medications on file prior to visit.    BP 126/84  Pulse 97  Wt 222 lb (100.699 kg)  BMI 34.76 kg/m2  SpO2 94%chart    Objective:   Physical Exam  Constitutional: He is oriented to person, place, and time. He appears well-developed and well-nourished.  HENT:  Right Ear: External ear normal.  Left Ear: External ear normal.  Nose: Nose normal.  Mouth/Throat: Oropharynx is clear and moist.  Neck: Normal range of motion. Neck supple.  Cardiovascular: Normal rate, regular rhythm and normal heart sounds.   Pulmonary/Chest: Effort normal and breath sounds normal.  Abdominal: Soft. Bowel sounds are normal.   Musculoskeletal: Normal range of motion.  Neurological: He is alert and oriented to person, place, and time.  Skin: Skin is warm and dry.  Psychiatric: He has a normal mood and affect.          Assessment & Plan:  Assessment:  1.  status post bilateral knee replacement 2. Type 2 diabetes 3. Hyperlipidemia  Plan: Blood pressure is stable today. Patient will return for fasting labs since he has not been here since October. Followup pending the results of his labs. No blood pressure medication necessary today.

## 2012-09-13 ENCOUNTER — Other Ambulatory Visit (INDEPENDENT_AMBULATORY_CARE_PROVIDER_SITE_OTHER): Payer: PRIVATE HEALTH INSURANCE

## 2012-09-13 DIAGNOSIS — E7800 Pure hypercholesterolemia, unspecified: Secondary | ICD-10-CM

## 2012-09-13 DIAGNOSIS — E1149 Type 2 diabetes mellitus with other diabetic neurological complication: Secondary | ICD-10-CM

## 2012-09-13 LAB — LIPID PANEL
Cholesterol: 156 mg/dL (ref 0–200)
Total CHOL/HDL Ratio: 4

## 2012-09-13 LAB — BASIC METABOLIC PANEL
BUN: 12 mg/dL (ref 6–23)
CO2: 31 mEq/L (ref 19–32)
Calcium: 10.1 mg/dL (ref 8.4–10.5)
Creatinine, Ser: 0.7 mg/dL (ref 0.4–1.5)
Glucose, Bld: 128 mg/dL — ABNORMAL HIGH (ref 70–99)

## 2012-09-13 LAB — HEPATIC FUNCTION PANEL
AST: 24 U/L (ref 0–37)
Alkaline Phosphatase: 94 U/L (ref 39–117)
Bilirubin, Direct: 0 mg/dL (ref 0.0–0.3)
Total Bilirubin: 0.4 mg/dL (ref 0.3–1.2)

## 2012-10-19 ENCOUNTER — Other Ambulatory Visit: Payer: Self-pay

## 2012-11-01 ENCOUNTER — Other Ambulatory Visit: Payer: Self-pay | Admitting: *Deleted

## 2012-11-01 MED ORDER — LIRAGLUTIDE 18 MG/3ML ~~LOC~~ SOPN: 0.2000 mL | PEN_INJECTOR | Freq: Every day | SUBCUTANEOUS | Status: DC

## 2012-11-28 ENCOUNTER — Telehealth: Payer: Self-pay | Admitting: Internal Medicine

## 2012-11-28 NOTE — Telephone Encounter (Signed)
Pt is sch for 12-06-12

## 2012-11-28 NOTE — Telephone Encounter (Signed)
Pt would to have A1C drawn next wk. Pt has a1c was in 09-2012

## 2012-11-28 NOTE — Telephone Encounter (Signed)
Does he need of after labs?

## 2012-11-28 NOTE — Telephone Encounter (Signed)
Per padonda- he may have aic, without ov

## 2012-12-06 ENCOUNTER — Other Ambulatory Visit (INDEPENDENT_AMBULATORY_CARE_PROVIDER_SITE_OTHER): Payer: PRIVATE HEALTH INSURANCE

## 2012-12-09 ENCOUNTER — Telehealth: Payer: Self-pay | Admitting: Internal Medicine

## 2012-12-09 NOTE — Telephone Encounter (Signed)
Pt would like blood work result  °

## 2012-12-13 NOTE — Telephone Encounter (Signed)
Pt informed and he states he will work on it

## 2013-01-19 ENCOUNTER — Other Ambulatory Visit: Payer: Self-pay

## 2013-02-18 ENCOUNTER — Other Ambulatory Visit: Payer: Self-pay | Admitting: Internal Medicine

## 2013-02-20 ENCOUNTER — Telehealth: Payer: Self-pay | Admitting: Internal Medicine

## 2013-02-20 ENCOUNTER — Other Ambulatory Visit: Payer: Self-pay | Admitting: *Deleted

## 2013-02-20 NOTE — Telephone Encounter (Signed)
Pt states he saw dr Artist Pais last year blood sugar issue. Pt states his fasting BS is around  160-200.  Pt would like an order for a1c and then appt to see dr Artist Pais for fu. Is this ok? Pt also ask for lipid panel and triglyceride.

## 2013-02-20 NOTE — Telephone Encounter (Signed)
Pt is going out to town today

## 2013-02-20 NOTE — Telephone Encounter (Signed)
lmom/kh 

## 2013-02-20 NOTE — Telephone Encounter (Signed)
Ok per Dr Lovell Sheehan and Dr Artist Pais.  Schedule lab appt and office visit

## 2013-02-21 NOTE — Telephone Encounter (Signed)
appt sch/kh 

## 2013-02-27 ENCOUNTER — Other Ambulatory Visit (INDEPENDENT_AMBULATORY_CARE_PROVIDER_SITE_OTHER): Payer: PRIVATE HEALTH INSURANCE

## 2013-02-27 DIAGNOSIS — E1310 Other specified diabetes mellitus with ketoacidosis without coma: Secondary | ICD-10-CM

## 2013-02-27 DIAGNOSIS — E785 Hyperlipidemia, unspecified: Secondary | ICD-10-CM

## 2013-02-27 DIAGNOSIS — E1110 Type 2 diabetes mellitus with ketoacidosis without coma: Secondary | ICD-10-CM

## 2013-02-27 LAB — LDL CHOLESTEROL, DIRECT: Direct LDL: 102.2 mg/dL

## 2013-02-27 LAB — LIPID PANEL
HDL: 45.3 mg/dL (ref >39.00)
Triglycerides: 209 mg/dL — ABNORMAL HIGH (ref 0.0–149.0)
VLDL: 41.8 mg/dL — ABNORMAL HIGH (ref 0.0–40.0)

## 2013-02-27 LAB — HEMOGLOBIN A1C: Hgb A1c MFr Bld: 6.7 % — ABNORMAL HIGH (ref 4.6–6.5)

## 2013-03-02 ENCOUNTER — Telehealth: Payer: Self-pay | Admitting: Internal Medicine

## 2013-03-02 NOTE — Telephone Encounter (Signed)
Pt has appt with Dr Artist Pais and MD is not avail tomorrow. Pt would like to have his labs posted on MY chart.

## 2013-03-03 ENCOUNTER — Ambulatory Visit: Payer: PRIVATE HEALTH INSURANCE | Admitting: Internal Medicine

## 2013-03-03 NOTE — Telephone Encounter (Signed)
Labs released.  

## 2013-03-20 ENCOUNTER — Ambulatory Visit: Payer: PRIVATE HEALTH INSURANCE | Admitting: Internal Medicine

## 2013-03-27 ENCOUNTER — Encounter: Payer: Self-pay | Admitting: Internal Medicine

## 2013-03-27 ENCOUNTER — Ambulatory Visit (INDEPENDENT_AMBULATORY_CARE_PROVIDER_SITE_OTHER): Payer: PRIVATE HEALTH INSURANCE | Admitting: Internal Medicine

## 2013-03-27 VITALS — BP 152/90 | HR 84 | Temp 98.3°F | Ht 67.0 in | Wt 233.0 lb

## 2013-03-27 DIAGNOSIS — E119 Type 2 diabetes mellitus without complications: Secondary | ICD-10-CM

## 2013-03-27 DIAGNOSIS — I10 Essential (primary) hypertension: Secondary | ICD-10-CM

## 2013-03-27 DIAGNOSIS — E1159 Type 2 diabetes mellitus with other circulatory complications: Secondary | ICD-10-CM | POA: Insufficient documentation

## 2013-03-27 DIAGNOSIS — E785 Hyperlipidemia, unspecified: Secondary | ICD-10-CM

## 2013-03-27 MED ORDER — ATORVASTATIN CALCIUM 20 MG PO TABS: 20.0000 mg | ORAL_TABLET | Freq: Every day | ORAL | Status: DC

## 2013-03-27 MED ORDER — GLIMEPIRIDE 2 MG PO TABS: 1.0000 mg | ORAL_TABLET | Freq: Two times a day (BID) | ORAL | Status: DC

## 2013-03-27 MED ORDER — METFORMIN HCL 1000 MG PO TABS: 1000.0000 mg | ORAL_TABLET | Freq: Two times a day (BID) | ORAL | Status: DC

## 2013-03-27 MED ORDER — LOSARTAN POTASSIUM 50 MG PO TABS: 50.0000 mg | ORAL_TABLET | Freq: Every day | ORAL | Status: DC

## 2013-03-27 NOTE — Progress Notes (Signed)
Subjective:    Patient ID: Rickey Anderson, male    DOB: 1956/01/25, 58 y.o.   MRN: [id number]  HPI  58 year old white male with history of uncontrolled type 2 diabetes, hyperlipidemia and hypertension for followup. Patient reports good medication compliance. He does not always follow diabetic diet. He exercises intermittently. He denies any significant hypoglycemic episodes.  He has not been on any statins for primary prevention.  His blood pressure has been sporadically elevated in the past. He is not taking any ACE inhibitors or ARB.  Review of Systems Negative for chest pain.  Negative for visual changes.  This his eye exam is unremarkable and was negative for diabetic retinopathy.    Past Medical History  Diagnosis Date  . GERD (gastroesophageal reflux disease)   . Diabetes mellitus   . Hyperlipidemia   . Herpes   . Norovirus     1'14- no reoccurring issues    History   Social History  . Marital Status: Married    Spouse Name: N/A    Number of Children: N/A  . Years of Education: N/A   Occupational History  . Not on file.   Social History Main Topics  . Smoking status: Former Smoker    Quit date: 07/18/1997  . Smokeless tobacco: Not on file  . Alcohol Use: No  . Drug Use: No  . Sexual Activity: Yes    Birth Control/ Protection: None   Other Topics Concern  . Not on file   Social History Narrative  . No narrative on file    Past Surgical History  Procedure Laterality Date  . Knee surgery      both knees-scopes  . Tonsillectomy    . Functional endoscopic sinus surgery      Deviated septum repair 10'13  . Vasectomy    . Eardrum      hx. perforated ear drum surgery right  . 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 Hypertension    . Heart disease    .  Alcohol abuse    . Depression      No Known Allergies  Current Outpatient Prescriptions on File Prior to Visit  Medication Sig Dispense Refill  . acetaminophen (TYLENOL) 325 MG tablet Take 2 tablets (650 mg total) by mouth every 6 (six) hours as needed.  60 tablet  0  . methocarbamol (ROBAXIN) 500 MG tablet Take 1 tablet (500 mg total) by mouth every 6 (six) hours as needed.  90 tablet  0  . omeprazole (PRILOSEC) 20 MG capsule Take 1 capsule (20 mg total) by mouth daily.  30 capsule  1  . traMADol (ULTRAM) 50 MG tablet Take 1-2 tablets (50-100 mg total) by mouth every 6 (six) hours as needed.  90 tablet  0  . VICTOZA 18 MG/3ML SOPN Inject 0.2 mls into the skin once a day  6 mL  11   No current facility-administered medications on file prior to visit.    BP 152/90  Pulse 84  Temp(Src) 98.3 F (36.8 C) (Oral)  Ht 5\' 7"  (1.702 m)  Wt 233 lb (105.688 kg)  BMI 36.48 kg/m2    Objective:   Physical Exam  Constitutional: He is oriented to person, place, and time. He appears well-developed and well-nourished. No distress.  HENT:  Head: Normocephalic and atraumatic.  Neck: Neck supple.  No carotid bruit  Cardiovascular: Normal rate, regular rhythm and normal heart sounds.   No murmur heard. Pulmonary/Chest: Effort normal and breath sounds normal. He has no wheezes.  Musculoskeletal: He exhibits no edema.  Lymphadenopathy:    He has no cervical adenopathy.  Neurological: He is alert and oriented to person, place, and time. No cranial nerve deficit.  Skin: Skin is warm and dry.  Psychiatric: He has a normal mood and affect. His behavior is normal.      Assessment & Plan:

## 2013-03-27 NOTE — Progress Notes (Signed)
[facility] did not print

## 2013-03-27 NOTE — Patient Instructions (Signed)
Please complete the following lab tests before your next follow up appointment: BMET - 401.9 Bring your blood sugar log to your next follow up appointment. Limit your carbohydrates to 30 grams per meal

## 2013-03-27 NOTE — Assessment & Plan Note (Signed)
We discussed decreasing/tapering Amaryl dose. Patient encouraged to more strictly follow diabetic diet. Start statin therapy for primary prevention. Continue same dose of Victoza.

## 2013-03-27 NOTE — Assessment & Plan Note (Signed)
Start losartan 50 mg once daily. Stressed importance of blood pressure control to avoid macrovascular complications.  Arrange followup electrolytes and kidney function. BP: 152/90 mmHg

## 2013-03-27 NOTE — Assessment & Plan Note (Signed)
Start atorvastatin 20 mg. Arrange followup fasting lipid panel and LFTs.

## 2013-03-28 ENCOUNTER — Telehealth: Payer: Self-pay | Admitting: Internal Medicine

## 2013-03-28 NOTE — Telephone Encounter (Signed)
Relevant patient education assigned to patient using Emmi. ° °

## 2013-04-20 ENCOUNTER — Other Ambulatory Visit: Payer: Self-pay | Admitting: *Deleted

## 2013-04-20 ENCOUNTER — Telehealth: Payer: Self-pay | Admitting: Internal Medicine

## 2013-04-20 MED ORDER — INSULIN PEN NEEDLE 32G X 4 MM MISC: 1.0000 "pen " | Status: DC

## 2013-04-20 NOTE — Telephone Encounter (Signed)
done

## 2013-04-20 NOTE — Telephone Encounter (Signed)
[facility] requesting new script for BD PEN NEEDLE NANO U/F MISCELLANEOUS [id number]

## 2013-04-26 ENCOUNTER — Encounter: Payer: Self-pay | Admitting: Internal Medicine

## 2013-04-26 ENCOUNTER — Ambulatory Visit (INDEPENDENT_AMBULATORY_CARE_PROVIDER_SITE_OTHER): Payer: PRIVATE HEALTH INSURANCE | Admitting: Internal Medicine

## 2013-04-26 VITALS — BP 134/84 | HR 72 | Temp 98.5°F | Ht 67.0 in | Wt 230.0 lb

## 2013-04-26 DIAGNOSIS — I10 Essential (primary) hypertension: Secondary | ICD-10-CM

## 2013-04-26 DIAGNOSIS — E785 Hyperlipidemia, unspecified: Secondary | ICD-10-CM

## 2013-04-26 DIAGNOSIS — E119 Type 2 diabetes mellitus without complications: Secondary | ICD-10-CM

## 2013-04-26 LAB — BASIC METABOLIC PANEL
BUN: 14 mg/dL (ref 6–23)
CALCIUM: 9.3 mg/dL (ref 8.4–10.5)
CO2: 28 mEq/L (ref 19–32)
CREATININE: 0.9 mg/dL (ref 0.4–1.5)
Chloride: 102 mEq/L (ref 96–112)
GFR: 92.26 mL/min (ref >60.00)
Glucose, Bld: 125 mg/dL — ABNORMAL HIGH (ref 70–99)
Potassium: 4.4 mEq/L (ref 3.5–5.1)
Sodium: 138 mEq/L (ref 135–145)

## 2013-04-26 NOTE — Progress Notes (Signed)
Pre visit review using our clinic review tool, if applicable. No additional management support is needed unless otherwise documented below in the visit note. 

## 2013-04-26 NOTE — Assessment & Plan Note (Signed)
Blood sugar stable.  Patient advised to continue tapering Amaryl dose. We discussed natural history of type 2 diabetes. He may have declining beta cell function.  I urged better dietary compliance.  Lab Results  Component Value Date   HGBA1C 6.7* 02/27/2013

## 2013-04-26 NOTE — Progress Notes (Signed)
Subjective:    Patient ID: Rickey Anderson, male    DOB: Jan 07, 1956, 58 y.o.   MRN: [id number]  HPI  58 year old white male with history of type 2 diabetes, obesity and hypertension for followup. Patient has been able to reduce his Amaryl dose to 2 mg once daily.  Patient also taking metformin 1000 mgtwice daily as well as Victoza 1.2 mg once daily.  His fasting blood sugars are still in the 130s. Patient reports dietary compliance is not always optimal. He works in [employer] and travels. He exercises sporadically.  Review of Systems Negative for chest pain, No paresthesias    Past Medical History  Diagnosis Date  . GERD (gastroesophageal reflux disease)   . Diabetes mellitus   . Hyperlipidemia   . Herpes   . Norovirus     1'14- no reoccurring issues    History   Social History  . Marital Status: Married    Spouse Name: N/A    Number of Children: N/A  . Years of Education: N/A   Occupational History  . Not on file.   Social History Main Topics  . Smoking status: Former Smoker    Quit date: 07/18/1997  . Smokeless tobacco: Not on file  . Alcohol Use: No  . Drug Use: No  . Sexual Activity: Yes    Birth Control/ Protection: None   Other Topics Concern  . Not on file   Social History Narrative  . No narrative on file    Past Surgical History  Procedure Laterality Date  . Knee surgery      both knees-scopes  . Tonsillectomy    . Functional endoscopic sinus surgery      Deviated septum repair 10'13  . Vasectomy    . Eardrum      hx. perforated ear drum surgery right  . 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 Hypertension    . Heart disease    . Alcohol abuse    . Depression      No Known Allergies  Current Outpatient Prescriptions on File Prior to Visit    Medication Sig Dispense Refill  . acetaminophen (TYLENOL) 325 MG tablet Take 2 tablets (650 mg total) by mouth every 6 (six) hours as needed.  60 tablet  0  . atorvastatin (LIPITOR) 20 MG tablet Take 1 tablet (20 mg total) by mouth daily.  90 tablet  1  . glimepiride (AMARYL) 2 MG tablet Take 0.5 tablets (1 mg total) by mouth 2 (two) times daily.  180 tablet  1  . Insulin Pen Needle 32G X 4 MM MISC 1 pen by Does not apply route 1 day or 1 dose.  100 each  1  . losartan (COZAAR) 50 MG tablet Take 1 tablet (50 mg total) by mouth daily.  90 tablet  1  . metFORMIN (GLUCOPHAGE) 1000 MG tablet Take 1 tablet (1,000 mg total) by mouth 2 (two) times daily with a meal.  180 tablet  1  . methocarbamol (ROBAXIN) 500 MG tablet Take 1 tablet (500 mg total) by mouth every 6 (six) hours as needed.  90 tablet  0  . Multiple Vitamin (MULTIVITAMIN) tablet Take 1 tablet by mouth daily.      Marland Kitchen omeprazole (PRILOSEC) 20 MG capsule Take 1 capsule (20 mg  total) by mouth daily.  30 capsule  1  . traMADol (ULTRAM) 50 MG tablet Take 1-2 tablets (50-100 mg total) by mouth every 6 (six) hours as needed.  90 tablet  0  . VICTOZA 18 MG/3ML SOPN Inject 0.2 mls into the skin once a day  6 mL  11   No current facility-administered medications on file prior to visit.    BP 134/84  Pulse 72  Temp(Src) 98.5 F (36.9 C) (Oral)  Ht 5\' 7"  (1.702 m)  Wt 230 lb (104.327 kg)  BMI 36.01 kg/m2    Objective:   Physical Exam  Constitutional: He is oriented to person, place, and time. He appears well-developed and well-nourished. No distress.  HENT:  Head: Normocephalic and atraumatic.  Cardiovascular: Normal rate, regular rhythm and normal heart sounds.   Pulmonary/Chest: Effort normal. He has no wheezes.  Musculoskeletal:  Trace lower extremity edema bilaterally  Neurological: He is alert and oriented to person, place, and time. No cranial nerve deficit.  Psychiatric: He has a normal mood and affect. His behavior is normal.           Assessment & Plan:

## 2013-04-26 NOTE — Patient Instructions (Addendum)
Try to taper of glimepiride as directed. Keep monitoring your blood pressure at home. Please complete the following lab tests before your next follow up appointment: BMET, A1c - 250.00 LFTs, TSH - 272.4 Please follow up with your eye doctor for diabetic eye exam

## 2013-04-26 NOTE — Assessment & Plan Note (Addendum)
Improved.  Continue losartan 50 mg.  Patient advised to monitor blood pressure at home. Patient understands importance of getting blood pressure to goal. BP: 134/84 mmHg  Monitor electrolytes and kidney function

## 2013-04-26 NOTE — Assessment & Plan Note (Signed)
Patient tolerating atorvastatin.  LFTs before next OV

## 2013-04-27 ENCOUNTER — Telehealth: Payer: Self-pay | Admitting: Internal Medicine

## 2013-04-27 NOTE — Telephone Encounter (Signed)
Relevant patient education assigned to patient using Emmi. ° °

## 2013-04-28 ENCOUNTER — Telehealth: Payer: Self-pay

## 2013-04-28 NOTE — Telephone Encounter (Signed)
Relevant patient education assigned to patient using Emmi. ° °

## 2013-05-01 ENCOUNTER — Ambulatory Visit: Payer: PRIVATE HEALTH INSURANCE | Admitting: Internal Medicine

## 2013-06-14 ENCOUNTER — Encounter: Payer: Self-pay | Admitting: Family

## 2013-06-14 ENCOUNTER — Ambulatory Visit (INDEPENDENT_AMBULATORY_CARE_PROVIDER_SITE_OTHER): Payer: PRIVATE HEALTH INSURANCE | Admitting: Family

## 2013-06-14 VITALS — BP 120/80 | HR 83 | Temp 99.0°F | Wt 234.0 lb

## 2013-06-14 DIAGNOSIS — E119 Type 2 diabetes mellitus without complications: Secondary | ICD-10-CM

## 2013-06-14 DIAGNOSIS — J069 Acute upper respiratory infection, unspecified: Secondary | ICD-10-CM

## 2013-06-14 MED ORDER — FLUTICASONE PROPIONATE 50 MCG/ACT NA SUSP: 2.0000 | Freq: Every day | NASAL | Status: DC

## 2013-06-14 MED ORDER — HYDROCOD POLST-CHLORPHEN POLST 10-8 MG/5ML PO LQCR: 5.0000 mL | Freq: Two times a day (BID) | ORAL | Status: DC | PRN

## 2013-06-14 NOTE — Patient Instructions (Signed)
Hay Fever Hay fever is an allergic reaction to particles in the air. It cannot be passed from person to person. It cannot be cured, but it can be controlled. CAUSES  Hay fever is caused by something that triggers an allergic reaction (allergens). The following are examples of allergens:  Ragweed.  Feathers.  Animal dander.  Grass and tree pollens.  Cigarette smoke.  House dust.  Pollution. SYMPTOMS   Sneezing.  Runny or stuffy nose.  Tearing eyes.  Itchy eyes, nose, mouth, throat, skin, or other area.  Sore throat.  Headache.  Decreased sense of smell or taste. DIAGNOSIS Your caregiver will perform a physical exam and ask questions about the symptoms you are having.Allergy testing may be done to determine exactly what triggers your hay fever.  TREATMENT   Over-the-counter medicines may help symptoms. These include:  Antihistamines.  Decongestants. These may help with nasal congestion.  Your caregiver may prescribe medicines if over-the-counter medicines do not work.  Some people benefit from allergy shots when other medicines are not helpful. HOME CARE INSTRUCTIONS   Avoid the allergen that is causing your symptoms, if possible.  Take all medicine as told by your caregiver. SEEK MEDICAL CARE IF:   You have severe allergy symptoms and your current medicines are not helping.  Your treatment was working at one time, but you are now experiencing symptoms.  You have sinus congestion and pressure.  You develop a fever or headache.  You have thick nasal discharge.  You have asthma and have a worsening cough and wheezing. SEEK IMMEDIATE MEDICAL CARE IF:   You have swelling of your tongue or lips.  You have trouble breathing.  You feel lightheaded or like you are going to faint.  You have cold sweats.  You have a fever. Document Released: 03/02/2005 Document Revised: 05/25/2011 Document Reviewed: 05/28/2010 [employer] Patient Information 2014  [employer]  

## 2013-06-14 NOTE — Progress Notes (Signed)
Pre visit review using our clinic review tool, if applicable. No additional management support is needed unless otherwise documented below in the visit note. 

## 2013-06-14 NOTE — Progress Notes (Signed)
Subjective:    Patient ID: Rickey Anderson, male    DOB: 01-07-1956, 58 y.o.   MRN: [id number]  HPI  58 year old white male, nonsmoker with a history of type 2 diabetes is in today with complaints of cough, congestion, sinus drainage x2 days and worsening. Reports that taking over-the-counter Mucinex to help some. Blood sugars fasting have been between 130-200. Has not been exercising as he typically does.   Review of Systems  Constitutional: Negative.  Negative for fever and fatigue.  HENT: Positive for congestion, postnasal drip and sore throat.   Respiratory: Positive for cough. Negative for wheezing.   Cardiovascular: Negative.   Skin: Negative.   Allergic/Immunologic: Positive for environmental allergies. Negative for food allergies and immunocompromised state.       Was in [place] last week.   Hematological: Negative.   Psychiatric/Behavioral: Negative.    Past Medical History  Diagnosis Date  . GERD (gastroesophageal reflux disease)   . Diabetes mellitus   . Hyperlipidemia   . Herpes   . Norovirus     1'14- no reoccurring issues    History   Social History  . Marital Status: Married    Spouse Name: N/A    Number of Children: N/A  . Years of Education: N/A   Occupational History  . Not on file.   Social History Main Topics  . Smoking status: Former Smoker    Quit date: 07/18/1997  . Smokeless tobacco: Not on file  . Alcohol Use: No  . Drug Use: No  . Sexual Activity: Yes    Birth Control/ Protection: None   Other Topics Concern  . Not on file   Social History Narrative  . No narrative on file    Past Surgical History  Procedure Laterality Date  . Knee surgery      both knees-scopes  . Tonsillectomy    . Functional endoscopic sinus surgery      Deviated septum repair 10'13  . Vasectomy    . Eardrum      hx. perforated ear drum surgery right  . 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 Hypertension    . Heart disease    . Alcohol abuse    . Depression      No Known Allergies  Current Outpatient Prescriptions on File Prior to Visit  Medication Sig Dispense Refill  . acetaminophen (TYLENOL) 325 MG tablet Take 2 tablets (650 mg total) by mouth every 6 (six) hours as needed.  60 tablet  0  . atorvastatin (LIPITOR) 20 MG tablet Take 1 tablet (20 mg total) by mouth daily.  90 tablet  1  . glimepiride (AMARYL) 2 MG tablet Take 0.5 tablets (1 mg total) by mouth 2 (two) times daily.  180 tablet  1  . Insulin Pen Needle 32G X 4 MM MISC 1 pen by Does not apply route 1 day or 1 dose.  100 each  1  . losartan (COZAAR) 50 MG tablet Take 1 tablet (50 mg total) by mouth daily.  90 tablet  1  . metFORMIN (GLUCOPHAGE) 1000 MG tablet Take 1 tablet (1,000 mg total) by mouth 2 (two) times daily with a meal.  180 tablet  1  . methocarbamol (ROBAXIN) 500 MG tablet Take 1 tablet (500 mg total) by  mouth every 6 (six) hours as needed.  90 tablet  0  . Multiple Vitamin (MULTIVITAMIN) tablet Take 1 tablet by mouth daily.      Marland Kitchen. omeprazole (PRILOSEC) 20 MG capsule Take 1 capsule (20 mg total) by mouth daily.  30 capsule  1  . traMADol (ULTRAM) 50 MG tablet Take 1-2 tablets (50-100 mg total) by mouth every 6 (six) hours as needed.  90 tablet  0  . VICTOZA 18 MG/3ML SOPN Inject 0.2 mls into the skin once a day  6 mL  11   No current facility-administered medications on file prior to visit.    BP 120/80  Pulse 83  Temp(Src) 99 F (37.2 C) (Oral)  Wt 234 lb (106.142 kg)chart     Objective:   Physical Exam  Constitutional: He is oriented to person, place, and time. He appears well-developed and well-nourished.  HENT:  Right Ear: External ear normal.  Left Ear: External ear normal.  Nose: Nose normal.  Mouth/Throat: Oropharynx is clear and moist.  Neck:  Normal range of motion. Neck supple.  Cardiovascular: Normal rate, regular rhythm and normal heart sounds.   Pulmonary/Chest: Effort normal and breath sounds normal.  Musculoskeletal: Normal range of motion.  Neurological: He is alert and oriented to person, place, and time.  Skin: Skin is warm and dry.  Psychiatric: He has a normal mood and affect.          Assessment & Plan:  Rickey SeniorStephen was seen today for cough, sore throat, sinusitis and headache.  Diagnoses and associated orders for this visit:  Acute upper respiratory infections of unspecified site  Type II or unspecified type diabetes mellitus without mention of complication, not stated as uncontrolled  Other Orders - chlorpheniramine-HYDROcodone (TUSSIONEX PENNKINETIC ER) 10-8 MG/5ML LQCR; Take 5 mLs by mouth every 12 (twelve) hours as needed for cough. - fluticasone (FLONASE) 50 MCG/ACT nasal spray; Place 2 sprays into both nostrils daily.   Call the office with any questions or concerns. Recheck as scheduled and as needed.

## 2013-06-22 ENCOUNTER — Other Ambulatory Visit: Payer: Self-pay

## 2013-07-03 ENCOUNTER — Telehealth: Payer: Self-pay | Admitting: Internal Medicine

## 2013-07-03 NOTE — Telephone Encounter (Signed)
Pt aware.

## 2013-07-03 NOTE — Telephone Encounter (Signed)
Pt would like to switch to Dr Artist PaisYoo from Dr Lovell SheehanJenkins, if that is ok with you both. Pt has been seeing Dr Artist PaisYoo in between and states Dr Artist PaisYoo is familiar w/ his issues and he feels comfortable w/ him. Is it ok to switch?  Pt will see Dr Lovell SheehanJenkins one last time for his cpe on 5/1

## 2013-07-03 NOTE — Telephone Encounter (Signed)
Thanks

## 2013-07-03 NOTE — Telephone Encounter (Signed)
Ok with me 

## 2013-07-10 ENCOUNTER — Other Ambulatory Visit (INDEPENDENT_AMBULATORY_CARE_PROVIDER_SITE_OTHER): Payer: PRIVATE HEALTH INSURANCE

## 2013-07-10 DIAGNOSIS — Z0000 Encounter for general adult medical examination without abnormal findings: Secondary | ICD-10-CM

## 2013-07-10 LAB — CBC WITH DIFFERENTIAL/PLATELET
BASOS PCT: 0.4 % (ref 0.0–3.0)
Basophils Absolute: 0 10*3/uL (ref 0.0–0.1)
EOS PCT: 3.4 % (ref 0.0–5.0)
Eosinophils Absolute: 0.3 10*3/uL (ref 0.0–0.7)
HEMATOCRIT: 40.4 % (ref 39.0–52.0)
HEMOGLOBIN: 13.6 g/dL (ref 13.0–17.0)
LYMPHS ABS: 2.1 10*3/uL (ref 0.7–4.0)
LYMPHS PCT: 22.1 % (ref 12.0–46.0)
MCHC: 33.7 g/dL (ref 30.0–36.0)
MCV: 90.2 fl (ref 78.0–100.0)
MONOS PCT: 6.8 % (ref 3.0–12.0)
Monocytes Absolute: 0.7 10*3/uL (ref 0.1–1.0)
NEUTROS ABS: 6.5 10*3/uL (ref 1.4–7.7)
Neutrophils Relative %: 67.3 % (ref 43.0–77.0)
Platelets: 341 10*3/uL (ref 150.0–400.0)
RBC: 4.48 Mil/uL (ref 4.22–5.81)
RDW: 13.8 % (ref 11.5–14.6)
WBC: 9.7 10*3/uL (ref 4.5–10.5)

## 2013-07-10 LAB — HEPATIC FUNCTION PANEL
ALBUMIN: 4.2 g/dL (ref 3.5–5.2)
ALT: 35 U/L (ref 0–53)
AST: 25 U/L (ref 0–37)
Alkaline Phosphatase: 82 U/L (ref 39–117)
Bilirubin, Direct: 0.1 mg/dL (ref 0.0–0.3)
Total Bilirubin: 0.6 mg/dL (ref 0.3–1.2)
Total Protein: 7.3 g/dL (ref 6.0–8.3)

## 2013-07-10 LAB — BASIC METABOLIC PANEL
BUN: 14 mg/dL (ref 6–23)
CALCIUM: 10.6 mg/dL — AB (ref 8.4–10.5)
CO2: 28 mEq/L (ref 19–32)
Chloride: 99 mEq/L (ref 96–112)
Creatinine, Ser: 0.8 mg/dL (ref 0.4–1.5)
GFR: 99.83 mL/min (ref >60.00)
Glucose, Bld: 200 mg/dL — ABNORMAL HIGH (ref 70–99)
POTASSIUM: 4.7 meq/L (ref 3.5–5.1)
SODIUM: 138 meq/L (ref 135–145)

## 2013-07-10 LAB — MICROALBUMIN / CREATININE URINE RATIO
CREATININE, U: 118.6 mg/dL
MICROALB UR: 3.6 mg/dL — AB (ref 0.0–1.9)
MICROALB/CREAT RATIO: 3 mg/g (ref 0.0–30.0)

## 2013-07-10 LAB — PSA: PSA: 0.57 ng/mL (ref 0.10–4.00)

## 2013-07-10 LAB — POCT URINALYSIS DIPSTICK
BILIRUBIN UA: NEGATIVE
Blood, UA: NEGATIVE
KETONES UA: NEGATIVE
Leukocytes, UA: NEGATIVE
NITRITE UA: NEGATIVE
PH UA: 5.5
Protein, UA: NEGATIVE
Spec Grav, UA: 1.025
Urobilinogen, UA: 0.2

## 2013-07-10 LAB — LIPID PANEL
CHOLESTEROL: 121 mg/dL (ref 0–200)
HDL: 43.5 mg/dL (ref >39.00)
LDL Cholesterol: 57 mg/dL (ref 0–99)
Total CHOL/HDL Ratio: 3
Triglycerides: 102 mg/dL (ref 0.0–149.0)
VLDL: 20.4 mg/dL (ref 0.0–40.0)

## 2013-07-10 LAB — HEMOGLOBIN A1C: HEMOGLOBIN A1C: 6.9 % — AB (ref 4.6–6.5)

## 2013-07-10 LAB — TSH: TSH: 0.93 u[IU]/mL (ref 0.35–5.50)

## 2013-07-11 ENCOUNTER — Other Ambulatory Visit: Payer: PRIVATE HEALTH INSURANCE

## 2013-07-12 ENCOUNTER — Ambulatory Visit (INDEPENDENT_AMBULATORY_CARE_PROVIDER_SITE_OTHER): Payer: PRIVATE HEALTH INSURANCE | Admitting: Internal Medicine

## 2013-07-12 ENCOUNTER — Encounter: Payer: Self-pay | Admitting: Internal Medicine

## 2013-07-12 VITALS — BP 102/70 | HR 76 | Temp 98.5°F | Ht 67.5 in | Wt 227.0 lb

## 2013-07-12 DIAGNOSIS — I10 Essential (primary) hypertension: Secondary | ICD-10-CM

## 2013-07-12 DIAGNOSIS — E785 Hyperlipidemia, unspecified: Secondary | ICD-10-CM

## 2013-07-12 DIAGNOSIS — E119 Type 2 diabetes mellitus without complications: Secondary | ICD-10-CM

## 2013-07-12 DIAGNOSIS — Z23 Encounter for immunization: Secondary | ICD-10-CM

## 2013-07-12 DIAGNOSIS — Z0000 Encounter for general adult medical examination without abnormal findings: Secondary | ICD-10-CM | POA: Insufficient documentation

## 2013-07-12 MED ORDER — LIRAGLUTIDE 18 MG/3ML ~~LOC~~ SOPN: 1.8000 mg | PEN_INJECTOR | Freq: Every day | SUBCUTANEOUS | Status: DC

## 2013-07-12 MED ORDER — ATORVASTATIN CALCIUM 20 MG PO TABS: 20.0000 mg | ORAL_TABLET | Freq: Every day | ORAL | Status: DC

## 2013-07-12 MED ORDER — LOSARTAN POTASSIUM 50 MG PO TABS: 50.0000 mg | ORAL_TABLET | Freq: Every day | ORAL | Status: DC

## 2013-07-12 MED ORDER — GLIMEPIRIDE 2 MG PO TABS: 1.0000 mg | ORAL_TABLET | Freq: Two times a day (BID) | ORAL | Status: DC

## 2013-07-12 MED ORDER — METFORMIN HCL 1000 MG PO TABS: 1000.0000 mg | ORAL_TABLET | Freq: Two times a day (BID) | ORAL | Status: DC

## 2013-07-12 NOTE — Patient Instructions (Addendum)
Follow up with your eye doctor for diabetic eye exam. Please complete the following lab tests before your next follow up appointment: BMET, A1c - 250.00

## 2013-07-12 NOTE — Assessment & Plan Note (Signed)
His A1c is stable. Increase Victoza dose to 1.8 mg once daily. We discussed strategies to avoid binge eating. Lab Results  Component Value Date   HGBA1C 6.9* 07/10/2013   Lab Results  Component Value Date   CREATININE 0.8 07/10/2013

## 2013-07-12 NOTE — Assessment & Plan Note (Signed)
Reviewed adult health maintenance protocols.  Patient will be due his next colonoscopy in June of 2019. Patient updated with Pneumovax.  I stressed importance of getting yearly influenza vaccine. Regular exercise and weight loss encouraged. Patient had normal digital rectal exam last year. His PSA is less than 1. He deferred digital rectal exam until next year.

## 2013-07-12 NOTE — Assessment & Plan Note (Signed)
Well controlled. No change in medication. BP: 102/70 mmHg

## 2013-07-12 NOTE — Progress Notes (Signed)
Subjective:    Patient ID: Rickey DerrickStephen C Anderson, male    DOB: Jul 20, 1955, 58 y.o.   MRN: [id number]  HPI  58 year old white male with history of type 2 diabetes, hyperlipidemia and hypertension for routine physical. Patient denies significant interval medical history. Preventative health care protocol reviewed. Patient has never had Pneumovax.  Patient's last colonoscopy was completed in 09/05/2007. Patient reports his colonoscopy was completely normal. His last diabetic eye exam was in August of 2013.  Patient exercising on a regular basis. He does admit to occasional binge eating.  Type 2 diabetes-A1c is stable.  Patient currently taking Victoza, metformin and Amaryl.  Patient denies chest pain. He denies paresthesias or lower extremities.  Hypertension - stable  Hyperlipidemia - stable  Pertinent social and family reviewed and updated.  Review of Systems  Constitutional: Negative for activity change, appetite change and unexpected weight change.  Eyes: Negative for visual disturbance.  Respiratory: Negative for cough, chest tightness and shortness of breath.   Cardiovascular: Negative for chest pain.  Genitourinary: Negative for difficulty urinating.  Neurological: Negative for headaches.  Gastrointestinal: Negative for abdominal pain, heartburn melena or hematochezia Psych: Negative for depression or anxiety Endo:  No polyuria or polydypsia        Past Medical History  Diagnosis Date  . GERD (gastroesophageal reflux disease)   . Diabetes mellitus   . Hyperlipidemia   . Herpes   . Norovirus     1'14- no reoccurring issues  . Alcohol abuse, in remission     History   Social History  . Marital Status: Married    Spouse Name: N/A    Number of Children: N/A  . Years of Education: N/A   Occupational History  . Technical sales     [employer]   Social History Main Topics  . Smoking status: Former Smoker    Quit date: 07/18/1997  . Smokeless tobacco: Not  on file  . Alcohol Use: No  . Drug Use: No  . Sexual Activity: Yes    Birth Control/ Protection: None   Other Topics Concern  . Not on file   Social History Narrative   Married 16 years   2 children - son, daugther   1 stepchild    Past Surgical History  Procedure Laterality Date  . Knee surgery      both knees-scopes  . Tonsillectomy    . Functional endoscopic sinus surgery      Deviated septum repair 10'13  . Vasectomy    . Eardrum      hx. perforated ear drum surgery right  . 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 Hypertension Father   . Heart disease Father     Amyloidosis  . Depression    . Prostate cancer Neg Hx   . Colon cancer Neg Hx     No Known Allergies  Current Outpatient Prescriptions on File Prior to Visit  Medication Sig Dispense Refill  . acetaminophen (TYLENOL) 325 MG tablet Take 2 tablets (650 mg total) by mouth every 6 (six) hours as needed.  60 tablet  0  . fluticasone (FLONASE) 50 MCG/ACT nasal spray Place 2 sprays into both nostrils daily.  16 g  1  . Insulin Pen Needle 32G X 4 MM MISC 1 pen by Does  not apply route 1 day or 1 dose.  100 each  1  . methocarbamol (ROBAXIN) 500 MG tablet Take 1 tablet (500 mg total) by mouth every 6 (six) hours as needed.  90 tablet  0  . Multiple Vitamin (MULTIVITAMIN) tablet Take 1 tablet by mouth daily.      Marland Kitchen. omeprazole (PRILOSEC) 20 MG capsule Take 1 capsule (20 mg total) by mouth daily.  30 capsule  1  . traMADol (ULTRAM) 50 MG tablet Take 1-2 tablets (50-100 mg total) by mouth every 6 (six) hours as needed.  90 tablet  0   No current facility-administered medications on file prior to visit.    BP 102/70  Pulse 76  Temp(Src) 98.5 F (36.9 C) (Oral)  Ht 5' 7.5" (1.715 m)  Wt 227 lb (102.967 kg)  BMI 35.01  kg/m2      Objective:   Physical Exam  Constitutional: He appears well-developed and well-nourished. No distress.  HENT:  Head: Normocephalic and atraumatic.  Right Ear: External ear normal.  Left Ear: External ear normal.  Mouth/Throat: Oropharynx is clear and moist.  Eyes: Conjunctivae and EOM are normal. Pupils are equal, round, and reactive to light.  Neck: Neck supple.  No carotid bruit  Cardiovascular: Normal rate, regular rhythm and normal heart sounds.   No murmur heard. Pulmonary/Chest: Effort normal and breath sounds normal. He has no wheezes.  Abdominal: Soft. Bowel sounds are normal. There is no tenderness.  Musculoskeletal: Normal range of motion. He exhibits no edema.  Lymphadenopathy:    He has no cervical adenopathy.  Neurological: He is alert. No cranial nerve deficit.  Skin: Skin is warm and dry.  Psychiatric: He has a normal mood and affect. His behavior is normal.          Assessment & Plan:

## 2013-07-12 NOTE — Progress Notes (Signed)
Pre visit review using our clinic review tool, if applicable. No additional management support is needed unless otherwise documented below in the visit note. 

## 2013-07-12 NOTE — Assessment & Plan Note (Signed)
LDL at goal.  Continue same dose of atorvastatin.

## 2013-07-14 ENCOUNTER — Encounter: Payer: PRIVATE HEALTH INSURANCE | Admitting: Internal Medicine

## 2013-07-20 ENCOUNTER — Other Ambulatory Visit: Payer: Self-pay | Admitting: Physical Medicine & Rehabilitation

## 2013-08-09 ENCOUNTER — Ambulatory Visit: Payer: PRIVATE HEALTH INSURANCE | Admitting: Internal Medicine

## 2013-08-15 ENCOUNTER — Telehealth: Payer: Self-pay | Admitting: Internal Medicine

## 2013-08-15 ENCOUNTER — Other Ambulatory Visit: Payer: Self-pay | Admitting: Physical Medicine & Rehabilitation

## 2013-08-15 MED ORDER — METFORMIN HCL 1000 MG PO TABS: 1000.0000 mg | ORAL_TABLET | Freq: Two times a day (BID) | ORAL | Status: DC

## 2013-08-15 NOTE — Addendum Note (Signed)
Addended by: Alfred Levins D on: 08/15/2013 01:46 PM   Modules accepted: Orders

## 2013-08-15 NOTE — Telephone Encounter (Signed)
rx sent in electronically 

## 2013-08-15 NOTE — Telephone Encounter (Signed)
Pt called to say that he need this rx refill metFORMIN (GLUCOPHAGE) 1000 MG tablet  He said he need the maxium refill supply because he travels .

## 2013-09-06 ENCOUNTER — Telehealth: Payer: Self-pay | Admitting: Internal Medicine

## 2013-09-06 NOTE — Telephone Encounter (Signed)
Pharm found the rx. Opened in eror

## 2013-10-20 ENCOUNTER — Telehealth: Payer: Self-pay | Admitting: Internal Medicine

## 2013-10-20 NOTE — Telephone Encounter (Signed)
Pt aware.

## 2013-10-20 NOTE — Telephone Encounter (Signed)
Pt is requesting to go back to 1 pill twice daily for his glimepiride (AMARYL) 2 MG tablet Dr. Artist Paisyoo had changed it to 1/2 pill, pt states his blood sugar is not coming down taking the 1/2 pill.

## 2013-10-20 NOTE — Telephone Encounter (Signed)
Pt states he is running out of his rx Liraglutide (VICTOZA) 18 MG/3ML SOPN, states pharmacy informed him that they have sent fax over 4 times but has not heard anything. Pt needs prior authorization for the rx.

## 2013-10-20 NOTE — Telephone Encounter (Signed)
Ok to go back to 1 tab bid

## 2013-10-20 NOTE — Telephone Encounter (Signed)
PA has been faxed to [facility].

## 2013-10-27 ENCOUNTER — Telehealth: Payer: Self-pay | Admitting: Internal Medicine

## 2013-10-27 MED ORDER — LIRAGLUTIDE 18 MG/3ML ~~LOC~~ SOPN: 1.8000 mg | PEN_INJECTOR | Freq: Every day | SUBCUTANEOUS | Status: DC

## 2013-10-27 NOTE — Telephone Encounter (Signed)
[facility] denied PA for Liraglutide (VICTOZA) 18 MG/3ML SOPN Pt must try and fail Bydureon or Byetta first

## 2013-10-30 MED ORDER — LIRAGLUTIDE 18 MG/3ML ~~LOC~~ SOPN: 1.8000 mg | PEN_INJECTOR | Freq: Every day | SUBCUTANEOUS | Status: DC

## 2013-10-30 NOTE — Telephone Encounter (Signed)
Pt will be leaving tomorrow. Pt needs more samples of victoza

## 2013-10-30 NOTE — Telephone Encounter (Signed)
Ok to provide samples of Victoza if available

## 2013-10-30 NOTE — Telephone Encounter (Signed)
Pt aware, samples are in refrigerator on IM side

## 2013-11-06 ENCOUNTER — Other Ambulatory Visit (INDEPENDENT_AMBULATORY_CARE_PROVIDER_SITE_OTHER): Payer: PRIVATE HEALTH INSURANCE

## 2013-11-06 DIAGNOSIS — IMO0001 Reserved for inherently not codable concepts without codable children: Secondary | ICD-10-CM

## 2013-11-06 DIAGNOSIS — E1165 Type 2 diabetes mellitus with hyperglycemia: Principal | ICD-10-CM

## 2013-11-06 LAB — BASIC METABOLIC PANEL
BUN: 19 mg/dL (ref 6–23)
CO2: 29 mEq/L (ref 19–32)
Calcium: 9.7 mg/dL (ref 8.4–10.5)
Chloride: 100 mEq/L (ref 96–112)
Creatinine, Ser: 1 mg/dL (ref 0.4–1.5)
GFR: 81.55 mL/min (ref >60.00)
Glucose, Bld: 214 mg/dL — ABNORMAL HIGH (ref 70–99)
POTASSIUM: 4.6 meq/L (ref 3.5–5.1)
Sodium: 138 mEq/L (ref 135–145)

## 2013-11-06 LAB — HEMOGLOBIN A1C: HEMOGLOBIN A1C: 7.7 % — AB (ref 4.6–6.5)

## 2013-11-13 ENCOUNTER — Ambulatory Visit (INDEPENDENT_AMBULATORY_CARE_PROVIDER_SITE_OTHER): Payer: PRIVATE HEALTH INSURANCE | Admitting: Internal Medicine

## 2013-11-13 ENCOUNTER — Encounter: Payer: Self-pay | Admitting: Internal Medicine

## 2013-11-13 VITALS — BP 140/80 | HR 83 | Temp 98.0°F | Ht 67.5 in | Wt 235.0 lb

## 2013-11-13 DIAGNOSIS — E785 Hyperlipidemia, unspecified: Secondary | ICD-10-CM

## 2013-11-13 DIAGNOSIS — I10 Essential (primary) hypertension: Secondary | ICD-10-CM

## 2013-11-13 DIAGNOSIS — E119 Type 2 diabetes mellitus without complications: Secondary | ICD-10-CM

## 2013-11-13 MED ORDER — EXENATIDE ER 2 MG ~~LOC~~ PEN: 2.0000 mg | PEN_INJECTOR | SUBCUTANEOUS | Status: DC

## 2013-11-13 MED ORDER — LOSARTAN POTASSIUM 100 MG PO TABS: 100.0000 mg | ORAL_TABLET | Freq: Every day | ORAL | Status: DC

## 2013-11-13 NOTE — Progress Notes (Signed)
Subjective:    Patient ID: Rickey Anderson, male    DOB: 12-10-1955, 58 y.o.   MRN: [id number]  HPI  58 year old white male with history of type 2 diabetes, hypertension and hyperlipidemia for routine followup. Patient denies any significant interval medical history.  Type 2 diabetes-patient admits to intermittent binge eating (sweets).    Hypertension-patient reports good medication compliance.  Review of Systems Negative for chest pain,  He exercises sporadically    Past Medical History  Diagnosis Date  . GERD (gastroesophageal reflux disease)   . Diabetes mellitus   . Hyperlipidemia   . Herpes   . Norovirus     1'14- no reoccurring issues  . Alcohol abuse, in remission     History   Social History  . Marital Status: Married    Spouse Name: N/A    Number of Children: N/A  . Years of Education: N/A   Occupational History  . Technical sales     [employer]   Social History Main Topics  . Smoking status: Former Smoker    Quit date: 07/18/1997  . Smokeless tobacco: Not on file  . Alcohol Use: No  . Drug Use: No  . Sexual Activity: Yes    Birth Control/ Protection: None   Other Topics Concern  . Not on file   Social History Narrative   Married 16 years   2 children - son, daugther   1 stepchild    Past Surgical History  Procedure Laterality Date  . Knee surgery      both knees-scopes  . Tonsillectomy    . Functional endoscopic sinus surgery      Deviated septum repair 10'13  . Vasectomy    . Eardrum      hx. perforated ear drum surgery right  . 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 Hypertension Father   . Heart disease Father     Amyloidosis  . Depression    . Prostate cancer Neg Hx   . Colon cancer Neg Hx     No Known Allergies  Current  Outpatient Prescriptions on File Prior to Visit  Medication Sig Dispense Refill  . acetaminophen (TYLENOL) 325 MG tablet Take 2 tablets (650 mg total) by mouth every 6 (six) hours as needed.  60 tablet  0  . atorvastatin (LIPITOR) 20 MG tablet Take 1 tablet (20 mg total) by mouth daily.  90 tablet  1  . glimepiride (AMARYL) 2 MG tablet Take 0.5 tablets (1 mg total) by mouth 2 (two) times daily.  180 tablet  1  . Insulin Pen Needle 32G X 4 MM MISC 1 pen by Does not apply route 1 day or 1 dose.  100 each  1  . metFORMIN (GLUCOPHAGE) 1000 MG tablet Take 1 tablet (1,000 mg total) by mouth 2 (two) times daily with a meal.  180 tablet  1  . methocarbamol (ROBAXIN) 500 MG tablet Take 1 tablet (500 mg total) by mouth every 6 (six) hours as needed.  90 tablet  0  . Multiple Vitamin (MULTIVITAMIN) tablet Take 1 tablet by mouth daily.      Marland Kitchen omeprazole (PRILOSEC) 20 MG capsule Take 1 capsule (20 mg total) by mouth daily.  30 capsule  1  . fluticasone (FLONASE) 50 MCG/ACT nasal spray  Place 2 sprays into both nostrils daily.  16 g  1  . traMADol (ULTRAM) 50 MG tablet Take 1-2 tablets (50-100 mg total) by mouth every 6 (six) hours as needed.  90 tablet  0   No current facility-administered medications on file prior to visit.    BP 140/80  Pulse 83  Temp(Src) 98 F (36.7 C) (Oral)  Ht 5' 7.5" (1.715 m)  Wt 235 lb (106.595 kg)  BMI 36.24 kg/m2  SpO2 97%    Objective:   Physical Exam  Constitutional: He is oriented to person, place, and time. He appears well-developed and well-nourished. No distress.  Cardiovascular: Normal rate, regular rhythm and normal heart sounds.   No murmur heard. Pulmonary/Chest: Effort normal and breath sounds normal. He has no wheezes.  Musculoskeletal: He exhibits no edema.  Neurological: He is alert and oriented to person, place, and time. No cranial nerve deficit.  Skin: Skin is warm and dry.  Psychiatric: He has a normal mood and affect. His behavior is normal.           Assessment & Plan:

## 2013-11-13 NOTE — Assessment & Plan Note (Addendum)
Worsening blood sugar control.  Victoza not covered by his insurance.  Change to Bydureon.  We discussed strategies to avoid binge eating.  Lab Results  Component Value Date   HGBA1C 7.7* 11/06/2013   HGBA1C 6.9* 07/10/2013   HGBA1C 6.7* 02/27/2013   Lab Results  Component Value Date   MICROALBUR 3.6* 07/10/2013   LDLCALC 57 07/10/2013   CREATININE 1.0 11/06/2013

## 2013-11-13 NOTE — Assessment & Plan Note (Signed)
Stable. Lab Results  Component Value Date   CHOL 121 07/10/2013   HDL 43.50 07/10/2013   LDLCALC 57 07/10/2013   LDLDIRECT 102.2 02/27/2013   TRIG 102.0 07/10/2013   CHOLHDL 3 07/10/2013

## 2013-11-13 NOTE — Progress Notes (Signed)
Pre visit review using our clinic review tool, if applicable. No additional management support is needed unless otherwise documented below in the visit note. 

## 2013-11-13 NOTE — Patient Instructions (Addendum)
Please complete the following lab tests before your next follow up appointment:  BMET, A1c - 250.02 

## 2013-11-13 NOTE — Assessment & Plan Note (Signed)
BP suboptimally controlled.  Increase losartan to 100 mg. BP: 140/80 mmHg

## 2013-12-29 ENCOUNTER — Other Ambulatory Visit: Payer: Self-pay

## 2014-01-22 ENCOUNTER — Other Ambulatory Visit (INDEPENDENT_AMBULATORY_CARE_PROVIDER_SITE_OTHER): Payer: PRIVATE HEALTH INSURANCE

## 2014-01-22 DIAGNOSIS — E1310 Other specified diabetes mellitus with ketoacidosis without coma: Secondary | ICD-10-CM

## 2014-01-22 DIAGNOSIS — E1110 Type 2 diabetes mellitus with ketoacidosis without coma: Secondary | ICD-10-CM

## 2014-01-22 LAB — BASIC METABOLIC PANEL
BUN: 20 mg/dL (ref 6–23)
CHLORIDE: 101 meq/L (ref 96–112)
CO2: 29 meq/L (ref 19–32)
Calcium: 10.1 mg/dL (ref 8.4–10.5)
Creatinine, Ser: 1 mg/dL (ref 0.4–1.5)
GFR: 77.88 mL/min (ref >60.00)
Glucose, Bld: 100 mg/dL — ABNORMAL HIGH (ref 70–99)
Potassium: 4.4 mEq/L (ref 3.5–5.1)
Sodium: 138 mEq/L (ref 135–145)

## 2014-01-22 LAB — HEMOGLOBIN A1C: Hgb A1c MFr Bld: 7.8 % — ABNORMAL HIGH (ref 4.6–6.5)

## 2014-01-29 ENCOUNTER — Ambulatory Visit (INDEPENDENT_AMBULATORY_CARE_PROVIDER_SITE_OTHER): Payer: PRIVATE HEALTH INSURANCE | Admitting: Internal Medicine

## 2014-01-29 ENCOUNTER — Ambulatory Visit (INDEPENDENT_AMBULATORY_CARE_PROVIDER_SITE_OTHER): Payer: PRIVATE HEALTH INSURANCE | Admitting: *Deleted

## 2014-01-29 ENCOUNTER — Encounter: Payer: Self-pay | Admitting: Internal Medicine

## 2014-01-29 VITALS — BP 114/64 | HR 84 | Temp 98.4°F | Ht 67.5 in | Wt 237.0 lb

## 2014-01-29 DIAGNOSIS — E1165 Type 2 diabetes mellitus with hyperglycemia: Secondary | ICD-10-CM

## 2014-01-29 DIAGNOSIS — IMO0002 Reserved for concepts with insufficient information to code with codable children: Secondary | ICD-10-CM

## 2014-01-29 DIAGNOSIS — E785 Hyperlipidemia, unspecified: Secondary | ICD-10-CM

## 2014-01-29 DIAGNOSIS — I10 Essential (primary) hypertension: Secondary | ICD-10-CM

## 2014-01-29 DIAGNOSIS — Z23 Encounter for immunization: Secondary | ICD-10-CM

## 2014-01-29 MED ORDER — ATORVASTATIN CALCIUM 20 MG PO TABS: 20.0000 mg | ORAL_TABLET | Freq: Every day | ORAL | Status: DC

## 2014-01-29 MED ORDER — GLIMEPIRIDE 2 MG PO TABS: 1.0000 mg | ORAL_TABLET | Freq: Two times a day (BID) | ORAL | Status: DC

## 2014-01-29 NOTE — Assessment & Plan Note (Signed)
Monitor FLP and LFTs before next office.  Low saturated fat diet encouraged.

## 2014-01-29 NOTE — Patient Instructions (Signed)
Please complete the following lab tests before your next follow up appointment:  BMET, A1c - 250.02 

## 2014-01-29 NOTE — Assessment & Plan Note (Signed)
Patient has slight worsening of hemoglobin A1c. It is now 7.8. Patient admits to periodic lapses in dietary adherence. Patient understands his diabetes control may worsen.  We discussed possibly starting insulin if patient not able to change his periodic binge eating sweets.  Patient updated with influenza vaccine.  Lab Results  Component Value Date   HGBA1C 7.8* 01/22/2014

## 2014-01-29 NOTE — Progress Notes (Signed)
Subjective:    Patient ID: Rickey Anderson, male    DOB: 08-05-1955, 58 y.o.   MRN: [id number]  HPI  58 year old white male with history of uncontrolled type 2 diabetes, hypertension and hyperlipidemia for routine follow-up. Patient denies any significant interval medical history.  Type 2 diabetes-his blood sugars have been somewhat erratic. When he is not traveling for business he follows fairly healthy diet. However his blood sugars can spike when he periodically indulges in sweets.  His exercise regimen is also episodic/irregular.  Hypertension - stable.  Review of Systems Negative for chest pain or shortness of breath    Past Medical History  Diagnosis Date  . GERD (gastroesophageal reflux disease)   . Diabetes mellitus   . Hyperlipidemia   . Herpes   . Norovirus     1'14- no reoccurring issues  . Alcohol abuse, in remission     History   Social History  . Marital Status: Married    Spouse Name: N/A    Number of Children: N/A  . Years of Education: N/A   Occupational History  . Technical sales     [employer]   Social History Main Topics  . Smoking status: Former Smoker    Quit date: 07/18/1997  . Smokeless tobacco: Not on file  . Alcohol Use: No  . Drug Use: No  . Sexual Activity: Yes    Birth Control/ Protection: None   Other Topics Concern  . Not on file   Social History Narrative   Married 16 years   2 children - son, daugther   1 stepchild    Past Surgical History  Procedure Laterality Date  . Knee surgery      both knees-scopes  . Tonsillectomy    . Functional endoscopic sinus surgery      Deviated septum repair 10'13  . Vasectomy    . Eardrum      hx. perforated ear drum surgery right  . 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  Hypertension Father   . Heart disease Father     Amyloidosis  . Depression    . Prostate cancer Neg Hx   . Colon cancer Neg Hx     No Known Allergies  Current Outpatient Prescriptions on File Prior to Visit  Medication Sig Dispense Refill  . acetaminophen (TYLENOL) 325 MG tablet Take 2 tablets (650 mg total) by mouth every 6 (six) hours as needed. 60 tablet 0  . Exenatide ER 2 MG PEN Inject 2 mg into the skin once a week. 4 each 5  . Insulin Pen Needle 32G X 4 MM MISC 1 pen by Does not apply route 1 day or 1 dose. 100 each 1  . losartan (COZAAR) 100 MG tablet Take 1 tablet (100 mg total) by mouth daily. 90 tablet 1  . metFORMIN (GLUCOPHAGE) 1000 MG tablet Take 1 tablet (1,000 mg total) by mouth 2 (two) times daily with a meal. 180 tablet 1  . methocarbamol (ROBAXIN) 500 MG tablet Take 1 tablet (500 mg total) by mouth every 6 (six) hours as needed. 90 tablet 0  . Multiple Vitamin (MULTIVITAMIN) tablet Take 1 tablet by mouth daily.    Marland Kitchen. omeprazole (PRILOSEC) 20 MG capsule Take 1 capsule (20 mg total) by mouth daily. 30 capsule  1  . traMADol (ULTRAM) 50 MG tablet Take 1-2 tablets (50-100 mg total) by mouth every 6 (six) hours as needed. 90 tablet 0   No current facility-administered medications on file prior to visit.    BP 114/64 mmHg  Pulse 84  Temp(Src) 98.4 F (36.9 C) (Oral)  Ht 5' 7.5" (1.715 m)  Wt 237 lb (107.502 kg)  BMI 36.55 kg/m2    Objective:   Physical Exam  Constitutional: He is oriented to person, place, and time. He appears well-developed and well-nourished.  Cardiovascular: Normal rate, regular rhythm and normal heart sounds.   No murmur heard. Pulmonary/Chest: Effort normal and breath sounds normal. He has no wheezes.  Neurological: He is alert and oriented to person, place, and time.  Psychiatric: He has a normal mood and affect. His behavior is normal.          Assessment & Plan:

## 2014-01-29 NOTE — Progress Notes (Signed)
Pre visit review using our clinic review tool, if applicable. No additional management support is needed unless otherwise documented below in the visit note. 

## 2014-01-29 NOTE — Assessment & Plan Note (Signed)
BP at goal.  BP: 114/64 mmHg  Lab Results  Component Value Date   NA 138 01/22/2014   K 4.4 01/22/2014   CL 101 01/22/2014   CO2 29 01/22/2014   Lab Results  Component Value Date   CREATININE 1.0 01/22/2014

## 2014-02-05 ENCOUNTER — Ambulatory Visit (INDEPENDENT_AMBULATORY_CARE_PROVIDER_SITE_OTHER): Payer: PRIVATE HEALTH INSURANCE | Admitting: Internal Medicine

## 2014-02-05 ENCOUNTER — Ambulatory Visit: Payer: PRIVATE HEALTH INSURANCE | Admitting: Family

## 2014-02-05 ENCOUNTER — Encounter: Payer: Self-pay | Admitting: Internal Medicine

## 2014-02-05 VITALS — BP 128/80 | Temp 98.9°F | Ht 66.5 in | Wt 230.3 lb

## 2014-02-05 DIAGNOSIS — M545 Low back pain, unspecified: Secondary | ICD-10-CM

## 2014-02-05 HISTORY — DX: Low back pain, unspecified: M54.50

## 2014-02-05 MED ORDER — CYCLOBENZAPRINE HCL 10 MG PO TABS: 5.0000 mg | ORAL_TABLET | Freq: Three times a day (TID) | ORAL | Status: DC | PRN

## 2014-02-05 NOTE — Progress Notes (Signed)
Pre visit review using our clinic review tool, if applicable. No additional management support is needed unless otherwise documented below in the visit note.   Chief Complaint  Patient presents with  . Back Pain    HPI: Patient Rickey Anderson  comes in today for SDA for  new problem evaluation. PCP  NA until this afternoon .  Onset 1 week  Insidious onset for pain . Right paralower back Then travels progressively worse and hard to lift suitcase cause of pain Took a tramadol and robaxin that am and then evening  Spasm a bit and worse with cough  . Kept taking  Left over  meds  Still hurt feels like a catch and hard to get socks on .  No pain pills  at this time  For 2 days  No uti sx . And no fevers.  No radiation. No weakness  Right groiun ? If worse.  ROS: See pertinent positives and negatives per HPI. Has had knee surgery  Skier and hopes to ski this holiday.   Past Medical History  Diagnosis Date  . GERD (gastroesophageal reflux disease)   . Diabetes mellitus   . Hyperlipidemia   . Herpes   . Norovirus     1'14- no reoccurring issues  . Alcohol abuse, in remission     Family History  Problem Relation Age of Onset  . Hypertension Father   . Heart disease Father     Amyloidosis  . Depression    . Prostate cancer Neg Hx   . Colon cancer Neg Hx     History   Social History  . Marital Status: Married    Spouse Name: N/A    Number of Children: N/A  . Years of Education: N/A   Occupational History  . Technical sales     [employer]   Social History Main Topics  . Smoking status: Former Smoker    Quit date: 07/18/1997  . Smokeless tobacco: None  . Alcohol Use: No  . Drug Use: No  . Sexual Activity: Yes    Birth Control/ Protection: None   Other Topics Concern  . None   Social History Narrative   Married 16 years   2 children - son, daugther   1 stepchild    Outpatient Encounter Prescriptions as of 02/05/2014  Medication Sig  .  acetaminophen (TYLENOL) 325 MG tablet Take 2 tablets (650 mg total) by mouth every 6 (six) hours as needed.  Marland Kitchen. atorvastatin (LIPITOR) 20 MG tablet Take 1 tablet (20 mg total) by mouth daily.  . Exenatide ER 2 MG PEN Inject 2 mg into the skin once a week.  Marland Kitchen. glimepiride (AMARYL) 2 MG tablet Take 0.5 tablets (1 mg total) by mouth 2 (two) times daily.  . Insulin Pen Needle 32G X 4 MM MISC 1 pen by Does not apply route 1 day or 1 dose.  . losartan (COZAAR) 100 MG tablet Take 1 tablet (100 mg total) by mouth daily.  . metFORMIN (GLUCOPHAGE) 1000 MG tablet Take 1 tablet (1,000 mg total) by mouth 2 (two) times daily with a meal.  . Multiple Vitamin (MULTIVITAMIN) tablet Take 1 tablet by mouth daily.  Marland Kitchen. omeprazole (PRILOSEC) 20 MG capsule Take 1 capsule (20 mg total) by mouth daily.  . cyclobenzaprine (FLEXERIL) 10 MG tablet Take 0.5-1 tablets (5-10 mg total) by mouth 3 (three) times daily as needed. Muscle spasm  . methocarbamol (ROBAXIN) 500 MG tablet Take 1 tablet (500 mg total)  by mouth every 6 (six) hours as needed. (Patient not taking: Reported on 02/05/2014)  . traMADol (ULTRAM) 50 MG tablet Take 1-2 tablets (50-100 mg total) by mouth every 6 (six) hours as needed. (Patient not taking: Reported on 02/05/2014)    EXAM:  BP 128/80 mmHg  Temp(Src) 98.9 F (37.2 C) (Oral)  Ht 5' 6.5" (1.689 m)  Wt 230 lb 4.8 oz (104.463 kg)  BMI 36.62 kg/m2  Body mass index is 36.62 kg/(m^2).  GENERAL: vitals reviewed and listed above, alert, oriented, appears well hydrated and in no acute distress HEENT: atraumatic, conjunctiva  clear, no obvious abnormalities on inspection of external nose and ears  NECK: no obvious masses on inspection palpation  LUNGS: clear to auscultation bilaterally, no wheezes, rales or rhonchi CV: HRRR, no clubbing cyanosisnl cap refill  MS: moves all extremities without noticeable focal  Abnormality Back no midline tender but tender right si area or just above  nnl toe help walk  neg slr  dtrs symmetrical hard to elicit no clonus  PSYCH: pleasant and cooperative, no obvious depression or anxiety Gait is normal otherwise   ASSESSMENT AND PLAN:  Discussed the following assessment and plan:  Right-sided low back pain without sciatica mechanical worse with bending  No systemic alarm sx   Disc limited use of oral med and physical modalities better  He requested different Muscle relaxants  Says ultram not very helpful advised against opiates for back pain of this type.  consider PT and or SM since he plans on skiing this season. -Patient advised to return or notify health care team  if symptoms worsen ,persist or new concerns arise.  Patient Instructions  aleve 2 twice a day for a week as needed. Caution with diabetes and ht .   Consider  Physical therapy   Sports  Medicine .    Muscle  Relaxants not that helpful but can try .Marland Kitchen.     Neta MendsWanda K. Panosh M.D.

## 2014-02-05 NOTE — Patient Instructions (Addendum)
aleve 2 twice a day for a week as needed. Caution with diabetes and ht .   Consider  Physical therapy   Sports  Medicine .    Muscle  Relaxants not that helpful but can try ..Marland Kitchen

## 2014-02-14 ENCOUNTER — Other Ambulatory Visit: Payer: Self-pay | Admitting: Internal Medicine

## 2014-03-13 ENCOUNTER — Encounter: Payer: Self-pay | Admitting: Family Medicine

## 2014-03-13 ENCOUNTER — Ambulatory Visit (INDEPENDENT_AMBULATORY_CARE_PROVIDER_SITE_OTHER): Payer: PRIVATE HEALTH INSURANCE | Admitting: Family Medicine

## 2014-03-13 VITALS — BP 126/64 | HR 95 | Temp 98.5°F | Ht 66.5 in | Wt 232.0 lb

## 2014-03-13 DIAGNOSIS — J209 Acute bronchitis, unspecified: Secondary | ICD-10-CM

## 2014-03-13 MED ORDER — AZITHROMYCIN 250 MG PO TABS: ORAL_TABLET | ORAL | Status: DC

## 2014-03-13 MED ORDER — HYDROCOD POLST-CHLORPHEN POLST 10-8 MG/5ML PO LQCR: 5.0000 mL | Freq: Two times a day (BID) | ORAL | Status: DC | PRN

## 2014-03-13 NOTE — Progress Notes (Signed)
Pre visit review using our clinic review tool, if applicable. No additional management support is needed unless otherwise documented below in the visit note. 

## 2014-03-13 NOTE — Progress Notes (Signed)
   Subjective:    Patient ID: Rickey DerrickStephen C Anderson, male    DOB: 1955-04-14, 58 y.o.   MRN: [id number]  HPI Here for 4 days of stuffy head, PND, chest tightness and coughing up green sputum.    Review of Systems  Constitutional: Negative.   HENT: Positive for congestion, postnasal drip and sinus pressure.   Eyes: Negative.   Respiratory: Positive for cough.        Objective:   Physical Exam  Constitutional: He appears well-developed and well-nourished.  HENT:  Right Ear: External ear normal.  Left Ear: External ear normal.  Nose: Nose normal.  Mouth/Throat: Oropharynx is clear and moist.  Eyes: Conjunctivae are normal.  Pulmonary/Chest: Effort normal and breath sounds normal.  Lymphadenopathy:    He has no cervical adenopathy.          Assessment & Plan:  Add Mucinex

## 2014-04-12 ENCOUNTER — Telehealth: Payer: Self-pay | Admitting: Internal Medicine

## 2014-04-12 NOTE — Telephone Encounter (Signed)
Pt said he was advised by the pharmacy to ask his doctor for a rx for One Touch Test Strips it would save him money.  He said he has just been buying them straight out    Pharmacy [facility]

## 2014-04-13 MED ORDER — GLUCOSE BLOOD VI STRP: 1.0000 | ORAL_STRIP | Freq: Every day | Status: DC

## 2014-04-13 NOTE — Telephone Encounter (Signed)
rx sent in electronically 

## 2014-04-14 ENCOUNTER — Other Ambulatory Visit: Payer: Self-pay | Admitting: Internal Medicine

## 2014-04-23 ENCOUNTER — Other Ambulatory Visit: Payer: PRIVATE HEALTH INSURANCE

## 2014-04-30 ENCOUNTER — Ambulatory Visit: Payer: PRIVATE HEALTH INSURANCE | Admitting: Internal Medicine

## 2014-05-14 ENCOUNTER — Other Ambulatory Visit (INDEPENDENT_AMBULATORY_CARE_PROVIDER_SITE_OTHER): Payer: PRIVATE HEALTH INSURANCE

## 2014-05-14 DIAGNOSIS — E119 Type 2 diabetes mellitus without complications: Secondary | ICD-10-CM

## 2014-05-14 DIAGNOSIS — I10 Essential (primary) hypertension: Secondary | ICD-10-CM

## 2014-05-14 LAB — BASIC METABOLIC PANEL
BUN: 22 mg/dL (ref 6–23)
CHLORIDE: 102 meq/L (ref 96–112)
CO2: 28 meq/L (ref 19–32)
Calcium: 10.6 mg/dL — ABNORMAL HIGH (ref 8.4–10.5)
Creatinine, Ser: 0.94 mg/dL (ref 0.40–1.50)
GFR: 87.42 mL/min (ref >60.00)
GLUCOSE: 131 mg/dL — AB (ref 70–99)
Potassium: 4.5 mEq/L (ref 3.5–5.1)
Sodium: 136 mEq/L (ref 135–145)

## 2014-05-14 LAB — HEMOGLOBIN A1C: HEMOGLOBIN A1C: 6.7 % — AB (ref 4.6–6.5)

## 2014-05-21 ENCOUNTER — Encounter: Payer: Self-pay | Admitting: Internal Medicine

## 2014-05-21 ENCOUNTER — Encounter: Payer: Self-pay | Admitting: *Deleted

## 2014-05-21 ENCOUNTER — Ambulatory Visit (INDEPENDENT_AMBULATORY_CARE_PROVIDER_SITE_OTHER): Payer: PRIVATE HEALTH INSURANCE | Admitting: Internal Medicine

## 2014-05-21 VITALS — BP 134/84 | HR 72 | Temp 98.6°F | Ht 66.5 in | Wt 225.0 lb

## 2014-05-21 DIAGNOSIS — R0602 Shortness of breath: Secondary | ICD-10-CM

## 2014-05-21 DIAGNOSIS — R0600 Dyspnea, unspecified: Secondary | ICD-10-CM | POA: Insufficient documentation

## 2014-05-21 DIAGNOSIS — I10 Essential (primary) hypertension: Secondary | ICD-10-CM

## 2014-05-21 DIAGNOSIS — IMO0002 Reserved for concepts with insufficient information to code with codable children: Secondary | ICD-10-CM

## 2014-05-21 DIAGNOSIS — E1165 Type 2 diabetes mellitus with hyperglycemia: Secondary | ICD-10-CM

## 2014-05-21 NOTE — Assessment & Plan Note (Signed)
Improved.  Good response to dietary changes and regular exercise.  No change in medication regimen.  A1c improved to 6.7  Lab Results  Component Value Date   HGBA1C 6.7* 05/14/2014   HGBA1C 7.8* 01/22/2014   HGBA1C 7.7* 11/06/2013   Lab Results  Component Value Date   MICROALBUR 3.6* 07/10/2013   LDLCALC 57 07/10/2013   CREATININE 0.94 05/14/2014

## 2014-05-21 NOTE — Progress Notes (Signed)
Subjective:    Patient ID: Rickey Anderson, male    DOB: 15-Apr-1955, 59 y.o.   MRN: [id number]  HPI  59 year old white male with history of uncontrolled type 2 diabetes, hypertension and hyperlipidemia for routine follow-up.  Type 2 diabetes-patient reports his morning blood sugars significantly improved he has made significant dietary changes and he is also exercising on a regular basis. Patient also was able to achieve lower blood sugars without using Amaryl.  Patient reports he has been skiing over the winter.  While he was [place] in [place], patient  noticed significant dyspnea associated with short runs down the mountain. Patient has not had such difficulty in the past.  Patient reports he was able to check his pulse ox at lift station. Resting pulse ox was 80%. After 15 minutes of oxygen therapy, it returned to normal (98%) but dropped again once he stopped oxygen supplementation.  Patient reports shortness of breath only associated with significant exertion. He denies any chest pain.  He denies any lower x-ray swelling or pain. He frequently travels for work.   Review of Systems Negative for cough, negative for chest pain    Past Medical History  Diagnosis Date  . GERD (gastroesophageal reflux disease)   . Diabetes mellitus   . Hyperlipidemia   . Herpes   . Norovirus     1'14- no reoccurring issues  . Alcohol abuse, in remission     History   Social History  . Marital Status: Married    Spouse Name: N/A  . Number of Children: N/A  . Years of Education: N/A   Occupational History  . Technical sales     [employer]   Social History Main Topics  . Smoking status: Former Smoker    Quit date: 07/18/1997  . Smokeless tobacco: Never Used  . Alcohol Use: No  . Drug Use: No  . Sexual Activity: Yes    Birth Control/ Protection: None   Other Topics Concern  . Not on file   Social History Narrative   Married 16 years   2 children - son, daugther    1 stepchild    Past Surgical History  Procedure Laterality Date  . Knee surgery      both knees-scopes  . Tonsillectomy    . Functional endoscopic sinus surgery      Deviated septum repair 10'13  . Vasectomy    . Eardrum      hx. perforated ear drum surgery right  . 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 Hypertension Father   . Heart disease Father     Amyloidosis  . Depression    . Prostate cancer Neg Hx   . Colon cancer Neg Hx     No Known Allergies  Current Outpatient Prescriptions on File Prior to Visit  Medication Sig Dispense Refill  . acetaminophen (TYLENOL) 325 MG tablet Take 2 tablets (650 mg total) by mouth every 6 (six) hours as needed. (Patient not taking: Reported on 03/13/2014) 60 tablet 0  . atorvastatin (LIPITOR) 20 MG tablet Take 1 tablet (20 mg total) by mouth daily. 90 tablet 1  . BYDUREON 2 MG PEN INJECT 2 MG INTO THE SKIN ONCE A WEEK. 4 each 5  . glimepiride (AMARYL) 2 MG tablet Take 0.5 tablets (1  mg total) by mouth 2 (two) times daily. 180 tablet 1  . Insulin Pen Needle 32G X 4 MM MISC 1 pen by Does not apply route 1 day or 1 dose. 100 each 1  . losartan (COZAAR) 100 MG tablet TAKE ONE TABLET BY MOUTH ONE TIME DAILY 30 tablet 5  . metFORMIN (GLUCOPHAGE) 1000 MG tablet Take one tablet by mouth twice daily with a meal 120 tablet 1  . Multiple Vitamin (MULTIVITAMIN) tablet Take 1 tablet by mouth daily.    Marland Kitchen omeprazole (PRILOSEC) 20 MG capsule Take 1 capsule (20 mg total) by mouth daily. 30 capsule 1   No current facility-administered medications on file prior to visit.    BP 134/84 mmHg  Pulse 72  Temp(Src) 98.6 F (37 C) (Oral)  Ht 5' 6.5" (1.689 m)  Wt 225 lb (102.059 kg)  BMI 35.78 kg/m2  SpO2 97%    Objective:   Physical Exam  Constitutional: He  is oriented to person, place, and time. He appears well-developed and well-nourished. No distress.  HENT:  Head: Normocephalic and atraumatic.  Right Ear: External ear normal.  Left Ear: External ear normal.  Mouth/Throat: Oropharynx is clear and moist.  Cardiovascular: Normal rate, regular rhythm and normal heart sounds.   No murmur heard. Pulmonary/Chest: Effort normal and breath sounds normal. He has no wheezes.  Musculoskeletal: He exhibits no edema.  No calf pain  Neurological: He is alert and oriented to person, place, and time. No cranial nerve deficit.  Skin: Skin is warm and dry.  Psychiatric: He has a normal mood and affect. His behavior is normal.          Assessment & Plan:

## 2014-05-21 NOTE — Assessment & Plan Note (Signed)
Stable.   BP: 134/84 mmHg

## 2014-05-21 NOTE — Assessment & Plan Note (Addendum)
59 year old white male with history of type 2 diabetes, hypertension and hyperlipidemia complains of dyspnea with exertion. He became very symptomatic during her recent ski trip. His pulse ox reported at 80% at rest. His pulse ox is normal today. Unclear whether dyspnea related to cardiac ischemia versus pulmonary abnormality. Obtain chest x-ray, CBCD and d-dimer. Arrange PFTs and  refer to cardiology for further testing.  Patient advised to call office if symptoms persist or worsen.  EKG negative for acute changes.

## 2014-05-21 NOTE — Progress Notes (Signed)
Pre visit review using our clinic review tool, if applicable. No additional management support is needed unless otherwise documented below in the visit note. 

## 2014-05-22 ENCOUNTER — Ambulatory Visit (INDEPENDENT_AMBULATORY_CARE_PROVIDER_SITE_OTHER)
Admission: RE | Admit: 2014-05-22 | Discharge: 2014-05-22 | Disposition: A | Discharge status: Home / Self Care | Payer: PRIVATE HEALTH INSURANCE | Source: Ambulatory Visit | Attending: Internal Medicine | Admitting: Internal Medicine

## 2014-05-22 DIAGNOSIS — R0602 Shortness of breath: Secondary | ICD-10-CM

## 2014-05-22 LAB — CBC WITH DIFFERENTIAL/PLATELET
BASOS ABS: 0.1 10*3/uL (ref 0.0–0.1)
Basophils Relative: 0.8 % (ref 0.0–3.0)
Eosinophils Absolute: 0.5 10*3/uL (ref 0.0–0.7)
Eosinophils Relative: 4.5 % (ref 0.0–5.0)
HCT: 39.2 % (ref 39.0–52.0)
Hemoglobin: 13.1 g/dL (ref 13.0–17.0)
LYMPHS ABS: 2.6 10*3/uL (ref 0.7–4.0)
LYMPHS PCT: 24.7 % (ref 12.0–46.0)
MCHC: 33.5 g/dL (ref 30.0–36.0)
MCV: 90.7 fl (ref 78.0–100.0)
MONOS PCT: 6.9 % (ref 3.0–12.0)
Monocytes Absolute: 0.7 10*3/uL (ref 0.1–1.0)
Neutro Abs: 6.6 10*3/uL (ref 1.4–7.7)
Neutrophils Relative %: 63.1 % (ref 43.0–77.0)
PLATELETS: 332 10*3/uL (ref 150.0–400.0)
RBC: 4.32 Mil/uL (ref 4.22–5.81)
RDW: 14.2 % (ref 11.5–15.5)
WBC: 10.4 10*3/uL (ref 4.0–10.5)

## 2014-05-22 LAB — D-DIMER, QUANTITATIVE (NOT AT ARMC): D DIMER QUANT: 0.61 ug{FEU}/mL — AB (ref 0.00–0.48)

## 2014-05-22 LAB — BRAIN NATRIURETIC PEPTIDE: Pro B Natriuretic peptide (BNP): 23 pg/mL (ref 0.0–100.0)

## 2014-05-28 ENCOUNTER — Telehealth: Payer: Self-pay | Admitting: Internal Medicine

## 2014-05-28 DIAGNOSIS — R0602 Shortness of breath: Secondary | ICD-10-CM

## 2014-05-28 NOTE — Telephone Encounter (Signed)
Patient came into the office to follow up on the conversation that he had with you last Thursday/Friday in regards to the CT scan/blood clot issue that was indicated in the blood work elevation. Please advise .Marland Kitchen.Marland Kitchen..Marland Kitchen

## 2014-05-28 NOTE — Telephone Encounter (Signed)
Pt wants to proceed with CT of chest.  Is this with or without contrast?

## 2014-05-29 NOTE — Telephone Encounter (Signed)
Is he having new or worsening symptoms?  If he wants CT of chest PE protocol, I suggest he hold metformin for 48 hrs before and after CT scan.

## 2014-06-01 ENCOUNTER — Other Ambulatory Visit: Payer: Self-pay | Admitting: *Deleted

## 2014-06-01 ENCOUNTER — Ambulatory Visit (INDEPENDENT_AMBULATORY_CARE_PROVIDER_SITE_OTHER)
Admission: RE | Admit: 2014-06-01 | Discharge: 2014-06-01 | Disposition: A | Discharge status: Home / Self Care | Payer: PRIVATE HEALTH INSURANCE | Source: Ambulatory Visit | Attending: Internal Medicine | Admitting: Internal Medicine

## 2014-06-01 ENCOUNTER — Encounter: Payer: Self-pay | Admitting: Internal Medicine

## 2014-06-01 ENCOUNTER — Ambulatory Visit (INDEPENDENT_AMBULATORY_CARE_PROVIDER_SITE_OTHER): Payer: PRIVATE HEALTH INSURANCE | Admitting: Internal Medicine

## 2014-06-01 VITALS — BP 114/82 | HR 88 | Ht 66.5 in | Wt 220.8 lb

## 2014-06-01 DIAGNOSIS — R0602 Shortness of breath: Secondary | ICD-10-CM

## 2014-06-01 DIAGNOSIS — R0600 Dyspnea, unspecified: Secondary | ICD-10-CM

## 2014-06-01 MED ORDER — IOHEXOL 350 MG/ML SOLN
75.0000 mL | Freq: Once | INTRAVENOUS | Status: AC | PRN
  Administered 2014-06-01: 75 mL via INTRAVENOUS

## 2014-06-01 NOTE — Progress Notes (Signed)
Cardiology Office Note   Date:  06/01/2014   ID:  Rickey Anderson, DOB 05/20/1955, MRN [id number]  PCP:  Thomos Lemonsobert Yoo, DO  Cardiologist:   Dietrich PatesPaula Chequita Mofield, MD   Chief Complaint  Patient presents with  . Shortness of Breath    with activity oxygen level drops      History of Present Illness: Rickey Anderson is a 59 y.o. 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    Patinet is trying to get back in shape Workng out but seems to be moving slower  Friends passing him  up  No significant edema  No syncope          Current Outpatient Prescriptions  Medication Sig Dispense Refill  . atorvastatin (LIPITOR) 20 MG tablet Take 1 tablet (20 mg total) by mouth daily. 90 tablet 1  . BYDUREON 2 MG PEN INJECT 2 MG INTO THE SKIN ONCE A WEEK. 4 each 5  . glimepiride (AMARYL) 2 MG tablet Take 2 mg by mouth continuous as needed (patient taling by mouth one 2 mg tablet when blood sugar is elevated).    . Insulin Pen Needle 32G X 4 MM MISC 1 pen by Does not apply route 1 day or 1 dose. 100 each 1  . losartan (COZAAR) 100 MG tablet TAKE ONE TABLET BY MOUTH ONE TIME DAILY 30 tablet 5  . metFORMIN (GLUCOPHAGE) 1000 MG tablet Take one tablet by mouth twice daily with a meal 120 tablet 1  . Multiple Vitamin (MULTIVITAMIN) tablet Take 1 tablet by mouth daily.    Marland Kitchen. omeprazole (PRILOSEC) 20 MG capsule Take 1 capsule (20 mg total) by mouth daily. 30 capsule 1   No current facility-administered medications for this visit.    Allergies:   Review of patient's allergies indicates no known allergies.   Past Medical History  Diagnosis Date  .  GERD (gastroesophageal reflux disease)   . Diabetes mellitus   . Hyperlipidemia   . Herpes   . Norovirus     1'14- no reoccurring issues  . Alcohol abuse, in remission     Past Surgical History  Procedure Laterality Date  . Knee surgery Bilateral     both knees-scopes  . Tonsillectomy    . Functional endoscopic sinus surgery      Deviated septum repair 10'13  . Vasectomy    . Eardrum      hx. perforated ear drum surgery right  . Total knee arthroplasty Bilateral 07/27/2012    Procedure: TOTAL KNEE BILATERAL;  Surgeon: Loanne DrillingFrank V Aluisio, MD;  Location: [facility];  Service: Orthopedics;  Laterality: Bilateral;     Social History:  The patient  reports that he quit smoking about 16 years ago. He has never used smokeless tobacco. He reports that he does not drink alcohol or use illicit drugs.   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 There is no history of Prostate cancer or Colon cancer.    ROS:  Please see the history of present illness. All other systems are reviewed and  Negative to the above problem except as noted.    PHYSICAL EXAM: VS:  BP 114/82 mmHg  Pulse 88  Ht 5' 6.5" (1.689 m)  Wt 220 lb 12.8 oz (100.154 kg)  BMI 35.11 kg/m2  GEN: Well nourished, well developed, in no acute distress HEENT: normal Neck: no JVD, carotid bruits, or masses Cardiac: RRR; no murmurs, rubs, or gallops,no edema  Respiratory:  clear to auscultation bilaterally, normal work of breathing GI: soft, nontender, nondistended, + BS  No hepatomegaly  MS: no deformity Moving all extremities   Skin: warm and dry, no rash Neuro:  Strength and sensation are intact Psych: euthymic mood, full affect   EKG:  EKG is ordered today.   Lipid Panel    Component Value Date/Time   CHOL 121 07/10/2013 1022   TRIG 102.0 07/10/2013 1022    HDL 43.50 07/10/2013 1022   CHOLHDL 3 07/10/2013 1022   VLDL 20.4 07/10/2013 1022   LDLCALC 57 07/10/2013 1022   LDLDIRECT 102.2 02/27/2013 1029      Wt Readings from Last 3 Encounters:  06/01/14 220 lb 12.8 oz (100.154 kg)  05/21/14 225 lb (102.059 kg)  03/13/14 232 lb (105.235 kg)      ASSESSMENT AND PLAN:  1  Dypsnea  Concerning  Sats dropping lead against cardiac unless he was in pulmonary edema at the time which he may have been Past med Hx signif for DM for 10 years and crack use. I would recomm CT to r/o chronic PE If neg given degree of symptoms I would recomm echo, poss R/L heart cath    2.  HL Continue statin  Good cntrol      Current medicines are reviewed at length with the patient today.  The patient does not have concerns regarding medicines.  The following changes have been made:   Labs/ tests ordered today include: No orders of the defined types were placed in this encounter.     Disposition:   FU with  in   Signed, Dietrich Pates, MD  06/01/2014 12:13 PM    [facility] [address] [place], [place], [place]  [address] Phone: [phone]; Fax: [phone]

## 2014-06-03 NOTE — Patient Instructions (Signed)
Will schedule echo

## 2014-06-05 ENCOUNTER — Ambulatory Visit (HOSPITAL_COMMUNITY): Payer: PRIVATE HEALTH INSURANCE | Attending: Internal Medicine

## 2014-06-05 DIAGNOSIS — I10 Essential (primary) hypertension: Secondary | ICD-10-CM | POA: Insufficient documentation

## 2014-06-05 DIAGNOSIS — R0600 Dyspnea, unspecified: Secondary | ICD-10-CM | POA: Insufficient documentation

## 2014-06-05 DIAGNOSIS — Z87891 Personal history of nicotine dependence: Secondary | ICD-10-CM | POA: Diagnosis not present

## 2014-06-05 NOTE — Telephone Encounter (Signed)
Cardiology did CT.  Nothing further is needed

## 2014-06-05 NOTE — Progress Notes (Signed)
2D Echo completed. 06/05/2014 

## 2014-06-06 ENCOUNTER — Encounter: Payer: Self-pay | Admitting: *Deleted

## 2014-06-11 ENCOUNTER — Telehealth: Payer: Self-pay | Admitting: Internal Medicine

## 2014-06-11 DIAGNOSIS — R0600 Dyspnea, unspecified: Secondary | ICD-10-CM

## 2014-06-11 NOTE — Telephone Encounter (Signed)
New problem    Pt stated he was returning a call maybe concerning maybe stress test. Pt want to speak to nurse to see if he is suppose to be having a stress test.

## 2014-06-12 ENCOUNTER — Other Ambulatory Visit: Payer: Self-pay | Admitting: Internal Medicine

## 2014-06-13 ENCOUNTER — Encounter: Payer: Self-pay | Admitting: *Deleted

## 2014-06-13 ENCOUNTER — Telehealth: Payer: Self-pay | Admitting: Internal Medicine

## 2014-06-13 NOTE — Telephone Encounter (Signed)
I spoke with Bonita QuinLinda in check out about the cpx. She stated the machine was down at the hospital & they had not received the message to schedule pt for this test.  I spoke with pt and he is aware our office will call him about appointment. His best days currently are Monday & Friday.  ( he is out of town next Wednesday & Thursday)  Explained Cardiopulmonary exercise test to patient. Explained restrictions of no food or drink 3 hours prior & dress to exercise on stationary bicycle. He is aware Michalene will call him back with further instructions   Mylo Redebbie Kale Rondeau RN

## 2014-06-13 NOTE — Telephone Encounter (Signed)
New message      Calling to talk to the nurse.  Pt travels and it is hard to get him.  He said he needs to schedule a test.  Monday and Friday are the best days to have test.  Please call

## 2014-06-15 ENCOUNTER — Ambulatory Visit (INDEPENDENT_AMBULATORY_CARE_PROVIDER_SITE_OTHER): Payer: PRIVATE HEALTH INSURANCE | Admitting: Family Medicine

## 2014-06-15 ENCOUNTER — Encounter: Payer: Self-pay | Admitting: Family Medicine

## 2014-06-15 VITALS — BP 112/78 | HR 79 | Temp 98.0°F | Wt 228.0 lb

## 2014-06-15 DIAGNOSIS — J069 Acute upper respiratory infection, unspecified: Secondary | ICD-10-CM

## 2014-06-15 MED ORDER — HYDROCOD POLST-CHLORPHEN POLST 10-8 MG/5ML PO LQCR: 5.0000 mL | Freq: Two times a day (BID) | ORAL | Status: DC | PRN

## 2014-06-15 NOTE — Patient Instructions (Signed)
Viral Upper respiratory infection  Treat mainly symptomatically   Return if fevers, persistence beyond 2 weeks, worsening shortness of breath.   Tussionex-do not drive at least 12 hours after taking  Rest and hydration are key

## 2014-06-15 NOTE — Progress Notes (Signed)
   Subjective:  Rickey Anderson is a 59 y.o. year old very pleasant male patient who presents with Upper Respiratory infection.   2 nights ago staying in hotel in [place] and started with scratchy throat and cough. Progressively worsening. Coughing all night. Tried robitussin and some other formulation and neither was helpful. Sore throat, dry cough, nasal discharge. Was raining when he was in [place]. No obvious sick contacts. No flu exposure. No body aches with this. Flu shot in November. No history of allergies.   ROS-denies fever, SOB, NVD, tooth pain. No sinus pressure.   Pertinent Past Medical History- DM with last a1c 6.7, HLD, hypertension  Medications- reviewed  Current Outpatient Prescriptions  Medication Sig Dispense Refill  . atorvastatin (LIPITOR) 20 MG tablet Take 1 tablet (20 mg total) by mouth daily. 90 tablet 1  . BYDUREON 2 MG PEN INJECT 2 MG INTO THE SKIN ONCE A WEEK. 4 each 5  . glimepiride (AMARYL) 2 MG tablet Take 2 mg by mouth continuous as needed (patient taling by mouth one 2 mg tablet when blood sugar is elevated).    . Insulin Pen Needle 32G X 4 MM MISC 1 pen by Does not apply route 1 day or 1 dose. 100 each 1  . losartan (COZAAR) 100 MG tablet TAKE ONE TABLET BY MOUTH ONE TIME DAILY 30 tablet 5  . metFORMIN (GLUCOPHAGE) 1000 MG tablet TAKE ONE TABLET BY MOUTH TWICE DAILY WITH A MEAL 120 tablet 0  . Multiple Vitamin (MULTIVITAMIN) tablet Take 1 tablet by mouth daily.    Marland Kitchen. omeprazole (PRILOSEC) 20 MG capsule Take 1 capsule (20 mg total) by mouth daily. 30 capsule 1   Objective: BP 112/78 mmHg  Pulse 79  Temp(Src) 98 F (36.7 C)  Wt 228 lb (103.42 kg)  SpO2 98% Gen: NAD, resting comfortably HEENT: Turbinates erythematous with yellow drainage, TM normal on L, chronic scarring on R, pharynx mildly erythematous with no tonsilar exudate or edema, no sinus tenderness CV: RRR no murmurs rubs or gallops Lungs: CTAB no crackles, wheeze, rhonchi Abdomen:  soft/nontender/nondistended/normal bowel sounds.  Ext: no edema Skin: warm, dry, no rash  Assessment/Plan:  Upper Respiratory infection -Tussionex for symptomatic care of cough and to help him rest  History and exam today are suggestive of viral URI.  We discussed that a serious infection or illness is unlikely. We also discussed other potential etiologies none suggestive of bacterial infection at this time. We discussed treatment side effects, likely course, antibiotic misuse, transmission, and signs of developing a serious illness.  Finally, we reviewed reasons to return to care including if symptoms worsen or persist or new concerns arise.  Meds ordered this encounter  Medications  . chlorpheniramine-HYDROcodone (TUSSIONEX) 10-8 MG/5ML LQCR    Sig: Take 5 mLs by mouth every 12 (twelve) hours as needed for cough.    Dispense:  115 mL    Refill:  0

## 2014-07-02 ENCOUNTER — Ambulatory Visit: Payer: PRIVATE HEALTH INSURANCE | Admitting: Internal Medicine

## 2014-07-06 ENCOUNTER — Ambulatory Visit (HOSPITAL_COMMUNITY): Payer: PRIVATE HEALTH INSURANCE | Attending: Internal Medicine

## 2014-07-06 DIAGNOSIS — R0600 Dyspnea, unspecified: Secondary | ICD-10-CM | POA: Insufficient documentation

## 2014-07-07 ENCOUNTER — Other Ambulatory Visit: Payer: Self-pay | Admitting: Internal Medicine

## 2014-07-09 ENCOUNTER — Ambulatory Visit: Payer: PRIVATE HEALTH INSURANCE | Admitting: Internal Medicine

## 2014-07-16 ENCOUNTER — Other Ambulatory Visit: Payer: Self-pay | Admitting: Internal Medicine

## 2014-07-17 ENCOUNTER — Other Ambulatory Visit: Payer: Self-pay | Admitting: Internal Medicine

## 2014-07-19 ENCOUNTER — Encounter: Payer: Self-pay | Admitting: Family Medicine

## 2014-07-19 ENCOUNTER — Ambulatory Visit (INDEPENDENT_AMBULATORY_CARE_PROVIDER_SITE_OTHER): Payer: PRIVATE HEALTH INSURANCE | Admitting: Family Medicine

## 2014-07-19 ENCOUNTER — Ambulatory Visit: Payer: PRIVATE HEALTH INSURANCE | Admitting: Family Medicine

## 2014-07-19 VITALS — BP 124/72 | HR 80 | Temp 98.1°F | Wt 230.0 lb

## 2014-07-19 DIAGNOSIS — I10 Essential (primary) hypertension: Secondary | ICD-10-CM

## 2014-07-19 DIAGNOSIS — E1165 Type 2 diabetes mellitus with hyperglycemia: Secondary | ICD-10-CM

## 2014-07-19 DIAGNOSIS — R0600 Dyspnea, unspecified: Secondary | ICD-10-CM | POA: Diagnosis not present

## 2014-07-19 DIAGNOSIS — E785 Hyperlipidemia, unspecified: Secondary | ICD-10-CM | POA: Diagnosis not present

## 2014-07-19 DIAGNOSIS — Z87891 Personal history of nicotine dependence: Secondary | ICD-10-CM | POA: Insufficient documentation

## 2014-07-19 DIAGNOSIS — IMO0002 Reserved for concepts with insufficient information to code with codable children: Secondary | ICD-10-CM

## 2014-07-19 NOTE — Assessment & Plan Note (Signed)
Controlled on atorvastatin 20mg  previously. Check direct LDL before next visit-ordered.

## 2014-07-19 NOTE — Progress Notes (Signed)
Tana ConchStephen Hunter, MD  Subjective:  Rickey Anderson is a 59 y.o. year old very pleasant male patient who presents with:  Dyspnea follow up  -Patient noted to have 80% at rest pulse ox on ski trip. He was very short of breath at the time and was having trouble keepign up which is abnormal for him. Extensive workup included normal cxr, CBC, ekg negative for acute change. D-dimer was elevated and had CT angio negative for PE. Referred to cardiology where he had a largely normal echo and cardiopulmonary exercise testing which pointed to obesity as central cause of dyspnea on exertion ROS- no chest pain, nausea, diaphoresis  DIABETES Type II-controlled on  Bydureon (glp 1) once weekly, metformin 1000mg  BID Lab Results  Component Value Date   HGBA1C 6.7* 05/14/2014   HGBA1C 7.8* 01/22/2014   HGBA1C 7.7* 11/06/2013   Medications taking and tolerating-yes Diet-admits to sweet binges Regular Exercise-2-3x a week   ROS- Denies hypoglycemia, blurry vision  Hypertension-controlled  BP Readings from Last 3 Encounters:  07/19/14 124/72  06/15/14 112/78  06/01/14 114/82  Compliant with medications-yes without side effects ROS-Denies any CP, HA, blurry vision, LE edema  Hyperlipidemia-controlled on last check  Lab Results  Component Value Date   LDLCALC 57 07/10/2013   On statin: atorvastatin 20mg  Regular exercise: 2-3x a week ROS- no chest pain. No myalgias  Past Medical History- GERD, OA knee s/p bilateral knee replacement, former smoker  Medications- reviewed and updated Current Outpatient Prescriptions  Medication Sig Dispense Refill  . atorvastatin (LIPITOR) 20 MG tablet TAKE ONE TABLET BY MOUTH ONE TIME DAILY 30 tablet 0  . BYDUREON 2 MG PEN INJECT 2 MG INTO THE SKIN ONCE A WEEK. 4 each 5  . glimepiride (AMARYL) 2 MG tablet TAKE ONE TABLET BY MOUTH TWICE DAILY 60 tablet 0  . Insulin Pen Needle 32G X 4 MM MISC 1 pen by Does not apply route 1 day or 1 dose. 100 each 1  . losartan  (COZAAR) 100 MG tablet TAKE ONE TABLET BY MOUTH ONE TIME DAILY 30 tablet 5  . metFORMIN (GLUCOPHAGE) 1000 MG tablet TAKE ONE TABLET BY MOUTH TWICE DAILY WITH A MEAL 120 tablet 0  . Multiple Vitamin (MULTIVITAMIN) tablet Take 1 tablet by mouth daily.    Marland Kitchen. omeprazole (PRILOSEC) 20 MG capsule Take 1 capsule (20 mg total) by mouth daily. 30 capsule 1   Objective: BP 124/72 mmHg  Pulse 80  Temp(Src) 98.1 F (36.7 C)  Wt 230 lb (104.327 kg)  SpO2 97% Gen: NAD, resting comfortably in chair, moves easily to table CV: RRR no murmurs rubs or gallops Lungs: CTAB no crackles, wheeze, rhonchi Abdomen: soft/nontender/nondistended/normal bowel sounds.  Ext: no edema Skin: warm, dry, no rash. Scars from bilateral knee replacements noted Neuro: grossly normal, moves all extremities   Assessment/Plan:  DM (diabetes mellitus), type 2, uncontrolled Continue Bydureon (glp 1) once weekly, metformin 1000mg  BID with last a1c 6.7 on this regimen. Follow up 3 months for repeat a1c.    Hyperlipidemia Controlled on atorvastatin 20mg  previously. Check direct LDL before next visit-ordered.    Hypertension Controlled on losartan 100mg  alone, continue   Dyspnea Extensive workup has been unrevealing (normal cxr, CBC, ekg negative for acute change. D-dimer was elevated and had CT angio negative for PE. Referred to cardiology where he had a largely normal echo and cardiopulmonary exercise testing which pointed to obesity as central cause of dyspnea on exertion). Appears main cause of DOE was obesity. Patient states  he has not heard from cardiology about results from cardiopulmonary testing. My preliminary review of this would suggest it would be safe to continue to pursue exercise/weight loss but will reach out to Dr. Tenny Crawoss who ordered test for final approval.    3 month follow up.   Future labs before 3 month visit.  Orders Placed This Encounter  Procedures  . Hemoglobin A1c    [place]    Standing Status:  Future     Number of Occurrences:      Standing Expiration Date: 07/19/2015  . LDL cholesterol, direct    [place]    Standing Status: Future     Number of Occurrences:      Standing Expiration Date: 07/19/2015

## 2014-07-19 NOTE — Patient Instructions (Signed)
I will reach out to Dr. Artist PaisYoo and let him know about our conversation today about switching care to me and the workup from your shortness of breath evaluation.   I will reach out to Dr. Tenny Crawoss. I assume from your evaluation you are safe to pursue continued exercise/weight loss but want to get her opinion.   Let's check in about 3 months from now for your weight progress and to check in on diabetes.

## 2014-07-19 NOTE — Assessment & Plan Note (Signed)
Controlled on losartan 100mg  alone, continue

## 2014-07-19 NOTE — Assessment & Plan Note (Signed)
Continue Bydureon (glp 1) once weekly, metformin 1000mg  BID with last a1c 6.7 on this regimen. Follow up 3 months for repeat a1c.

## 2014-07-19 NOTE — Assessment & Plan Note (Signed)
Extensive workup has been unrevealing (normal cxr, CBC, ekg negative for acute change. D-dimer was elevated and had CT angio negative for PE. Referred to cardiology where he had a largely normal echo and cardiopulmonary exercise testing which pointed to obesity as central cause of dyspnea on exertion). Appears main cause of DOE was obesity. Patient states he has not heard from cardiology about results from cardiopulmonary testing. My preliminary review of this would suggest it would be safe to continue to pursue exercise/weight loss but will reach out to Dr. Tenny Crawoss who ordered test for final approval.

## 2014-07-20 ENCOUNTER — Ambulatory Visit: Payer: PRIVATE HEALTH INSURANCE | Admitting: Internal Medicine

## 2014-08-05 ENCOUNTER — Other Ambulatory Visit: Payer: Self-pay | Admitting: Internal Medicine

## 2014-09-09 ENCOUNTER — Other Ambulatory Visit: Payer: Self-pay | Admitting: Internal Medicine

## 2014-09-10 ENCOUNTER — Other Ambulatory Visit: Payer: Self-pay

## 2014-09-29 ENCOUNTER — Other Ambulatory Visit: Payer: Self-pay | Admitting: Internal Medicine

## 2014-10-15 ENCOUNTER — Other Ambulatory Visit (INDEPENDENT_AMBULATORY_CARE_PROVIDER_SITE_OTHER): Payer: PRIVATE HEALTH INSURANCE

## 2014-10-15 DIAGNOSIS — E785 Hyperlipidemia, unspecified: Secondary | ICD-10-CM | POA: Diagnosis not present

## 2014-10-15 DIAGNOSIS — E1165 Type 2 diabetes mellitus with hyperglycemia: Secondary | ICD-10-CM | POA: Diagnosis not present

## 2014-10-15 DIAGNOSIS — IMO0002 Reserved for concepts with insufficient information to code with codable children: Secondary | ICD-10-CM

## 2014-10-15 LAB — LDL CHOLESTEROL, DIRECT: LDL DIRECT: 31 mg/dL

## 2014-10-15 LAB — HEMOGLOBIN A1C: Hgb A1c MFr Bld: 6.2 % (ref 4.6–6.5)

## 2014-10-22 ENCOUNTER — Ambulatory Visit (INDEPENDENT_AMBULATORY_CARE_PROVIDER_SITE_OTHER): Payer: PRIVATE HEALTH INSURANCE | Admitting: Family Medicine

## 2014-10-22 ENCOUNTER — Encounter: Payer: Self-pay | Admitting: Family Medicine

## 2014-10-22 VITALS — BP 126/74 | HR 80 | Temp 98.6°F | Wt 238.0 lb

## 2014-10-22 DIAGNOSIS — Z1159 Encounter for screening for other viral diseases: Secondary | ICD-10-CM

## 2014-10-22 DIAGNOSIS — E1165 Type 2 diabetes mellitus with hyperglycemia: Secondary | ICD-10-CM | POA: Diagnosis not present

## 2014-10-22 DIAGNOSIS — IMO0002 Reserved for concepts with insufficient information to code with codable children: Secondary | ICD-10-CM

## 2014-10-22 DIAGNOSIS — Z114 Encounter for screening for human immunodeficiency virus [HIV]: Secondary | ICD-10-CM

## 2014-10-22 DIAGNOSIS — E785 Hyperlipidemia, unspecified: Secondary | ICD-10-CM

## 2014-10-22 DIAGNOSIS — I10 Essential (primary) hypertension: Secondary | ICD-10-CM

## 2014-10-22 NOTE — Progress Notes (Signed)
[name], MD  Subjective:  Rickey Anderson is a 59 y.o. year old very pleasant male patient who presents with: See problem oriented charting ROS- admits to shakiness and anxiety with low CBG about 30-50% of time taking amaryl, no chest pain. Shortness of breath improving with exercise.   Past Medical History- obesity, OA, s/p total knee replacement, GERD, former smoker  Medications- reviewed and updated Current Outpatient Prescriptions  Medication Sig Dispense Refill  . atorvastatin (LIPITOR) 20 MG tablet TAKE ONE TABLET BY MOUTH ONE TIME DAILY 90 tablet 0  . BYDUREON 2 MG PEN INJECT 2 MG INTO THE SKIN ONCE A WEEK. 4 each 5  . glimepiride (AMARYL) 2 MG tablet TAKE ONE TABLET BY MOUTH TWICE DAILY 60 tablet 0  . Insulin Pen Needle 32G X 4 MM MISC 1 pen by Does not apply route 1 day or 1 dose. 100 each 1  . losartan (COZAAR) 100 MG tablet TAKE ONE TABLET BY MOUTH ONE TIME DAILY 30 tablet 5  . metFORMIN (GLUCOPHAGE) 1000 MG tablet TAKE ONE TABLET BY MOUTH TWICE DAILY WITH A MEAL 120 tablet 0  . Multiple Vitamin (MULTIVITAMIN) tablet Take 1 tablet by mouth daily.    Marland Kitchen omeprazole (PRILOSEC) 20 MG capsule Take 1 capsule (20 mg total) by mouth daily. 30 capsule 1   Objective: BP 126/74 mmHg  Pulse 80  Temp(Src) 98.6 F (37 C)  Wt 238 lb (107.956 kg) Gen: NAD, resting comfortably CV: RRR no murmurs rubs or gallops Lungs: CTAB no crackles, wheeze, rhonchi Abdomen: soft/nontender/nondistended/normal bowel sounds. obese Ext: no edema Skin: warm, dry Neuro: grossly normal, moves all extremities  Diabetic Foot Exam - Simple   Simple Foot Form  Diabetic Foot exam was performed with the following findings:  Yes 10/22/2014  2:50 PM  Visual Inspection  No deformities, no ulcerations, no other skin breakdown bilaterally:  Yes  Sensation Testing  Intact to touch and monofilament testing bilaterally:  Yes  Pulse Check  Posterior Tibialis and Dorsalis pulse intact bilaterally:  Yes   Comments      Assessment/Plan:  DM (diabetes mellitus), type 2, uncontrolled S: controlled by a1c, poor control off CBGs in 180s balanced with hypoglycemia (unmeasured but symptomatic) when on amaryl 2mg  BID.Exercising more- 3-4 x a week training and biking. Lowest 1x a week 1-2 hours.  A/P: stop amaryl and get baseline a1c in 3 months hopefully without any hypoglycemia. Continue bydureon and metformin. Hopeful a1c remains <7.    Hypertension S: controlled on losartan 100mg  alone A/P: continue current rx   Hyperlipidemia S: controlled with LDL of 31. On atorvastatin 20mg  Lab Results  Component Value Date   CHOL 121 07/10/2013   HDL 43.50 07/10/2013   LDLCALC 57 07/10/2013   LDLDIRECT 31.0 10/15/2014   TRIG 102.0 07/10/2013   CHOLHDL 3 07/10/2013  A/P: we will reduce dose to 10mg  (1/2 tab of 20mg ) and follow up LDL in 3 months- if remains <100 change pill to 10mg  atorvastatin   3 mo f/u with labs below before visit  Orders Placed This Encounter  Procedures  . LDL cholesterol, direct    [place]    Standing Status: Future     Number of Occurrences:      Standing Expiration Date: 10/22/2015  . Hemoglobin A1c        Standing Status: Future     Number of Occurrences:      Standing Expiration Date: 10/22/2015  . Hepatitis C antibody, reflex    [facility]  Standing Status: Future     Number of Occurrences:      Standing Expiration Date: 10/22/2015  . HIV antibody    [facility]    Standing Status: Future     Number of Occurrences:      Standing Expiration Date: 10/22/2015

## 2014-10-22 NOTE — Patient Instructions (Addendum)
Stop glimepiride. Lows from this may be making a1c artificially low. Get new meter from [facility]- check anytime you believe sugars are low and record. a1c before next visit  Take 1/2 an atorvastatin a day and recheck LDL in 3 months  See [place] in 3 months with labs a few days before  Get your eye exam and have them send [place] a copy

## 2014-10-23 NOTE — Assessment & Plan Note (Signed)
S: controlled with LDL of 31. On atorvastatin  Lab Results  Component Value Date   CHOL 121 07/10/2013   HDL 43.50 07/10/2013   LDLCALC 57 07/10/2013   LDLDIRECT 31.0 10/15/2014   TRIG 102.0 07/10/2013   CHOLHDL 3 07/10/2013  A/P: we will reduce dose to  (1/2 tab of ) and follow up LDL in 3 months- if remains <100 change pill to  atorvastatin

## 2014-10-23 NOTE — Assessment & Plan Note (Signed)
S: controlled on losartan  alone A/P: continue current rx

## 2014-10-23 NOTE — Assessment & Plan Note (Addendum)
S: controlled by a1c, poor control off CBGs in 180s balanced with hypoglycemia (unmeasured but symptomatic) when on amaryl  BID.Exercising more- 3-4 x a week training and biking. Lowest 1x a week 1-2 hours.  A/P: stop amaryl and get baseline a1c in 3 months hopefully without any hypoglycemia. Continue bydureon and metformin. Hopeful a1c remains <7.

## 2014-11-02 ENCOUNTER — Other Ambulatory Visit: Payer: Self-pay | Admitting: Internal Medicine

## 2014-11-04 ENCOUNTER — Other Ambulatory Visit: Payer: Self-pay | Admitting: Internal Medicine

## 2014-11-04 ENCOUNTER — Other Ambulatory Visit: Payer: Self-pay | Admitting: Family Medicine

## 2014-12-16 ENCOUNTER — Other Ambulatory Visit: Payer: Self-pay | Admitting: Family Medicine

## 2015-01-21 ENCOUNTER — Other Ambulatory Visit (INDEPENDENT_AMBULATORY_CARE_PROVIDER_SITE_OTHER): Payer: PRIVATE HEALTH INSURANCE

## 2015-01-21 ENCOUNTER — Other Ambulatory Visit: Payer: Self-pay | Admitting: Family Medicine

## 2015-01-21 DIAGNOSIS — E1165 Type 2 diabetes mellitus with hyperglycemia: Secondary | ICD-10-CM | POA: Diagnosis not present

## 2015-01-21 DIAGNOSIS — E785 Hyperlipidemia, unspecified: Secondary | ICD-10-CM | POA: Diagnosis not present

## 2015-01-21 DIAGNOSIS — Z1159 Encounter for screening for other viral diseases: Secondary | ICD-10-CM

## 2015-01-21 DIAGNOSIS — IMO0002 Reserved for concepts with insufficient information to code with codable children: Secondary | ICD-10-CM

## 2015-01-21 DIAGNOSIS — Z114 Encounter for screening for human immunodeficiency virus [HIV]: Secondary | ICD-10-CM

## 2015-01-21 LAB — HEMOGLOBIN A1C: HEMOGLOBIN A1C: 7.1 % — AB (ref 4.6–6.5)

## 2015-01-21 LAB — LDL CHOLESTEROL, DIRECT: Direct LDL: 30 mg/dL

## 2015-01-22 LAB — HEPATITIS C ANTIBODY: HCV Ab: NEGATIVE

## 2015-01-22 LAB — HIV ANTIBODY (ROUTINE TESTING W REFLEX): HIV: NONREACTIVE

## 2015-01-28 ENCOUNTER — Encounter: Payer: Self-pay | Admitting: Family Medicine

## 2015-01-28 ENCOUNTER — Ambulatory Visit (INDEPENDENT_AMBULATORY_CARE_PROVIDER_SITE_OTHER): Payer: PRIVATE HEALTH INSURANCE | Admitting: Family Medicine

## 2015-01-28 VITALS — BP 140/80 | HR 85 | Temp 98.8°F | Wt 241.0 lb

## 2015-01-28 DIAGNOSIS — IMO0001 Reserved for inherently not codable concepts without codable children: Secondary | ICD-10-CM

## 2015-01-28 DIAGNOSIS — E785 Hyperlipidemia, unspecified: Secondary | ICD-10-CM | POA: Diagnosis not present

## 2015-01-28 DIAGNOSIS — I10 Essential (primary) hypertension: Secondary | ICD-10-CM | POA: Diagnosis not present

## 2015-01-28 DIAGNOSIS — E1165 Type 2 diabetes mellitus with hyperglycemia: Secondary | ICD-10-CM

## 2015-01-28 NOTE — Patient Instructions (Addendum)
Usually booked but worth a try(Rickey Anderson. Rickey Anderson. Rickey Anderson.)  Rickey Anderson [phone] [email or web address] Wide variety. Probably best for eating disorder.  Rickey Anderson [phone] [email or web address]. Rickey Anderson [phone] [email or web address] Wide Variety, no specific eating disorder training Rickey Anderson [phone] [email or web address]  Consider [employer]   No changes today  a1c in 3 months and a day and see me a few days later  Please do this before next visit or have them fax us a copy if you already had this Health Maintenance Due  Topic Date Due  . OPHTHALMOLOGY EXAM  10/14/2014

## 2015-01-28 NOTE — Progress Notes (Signed)
Tana ConchStephen Bathsheba Durrett, MD  Subjective:  Rickey Anderson is a 59 y.o. year old very pleasant male patient who presents for/with See problem oriented charting ROS- No chest pain.  No headache or blurry vision. Denies hypoglycemia  Past Medical History-  Patient Active Problem List   Diagnosis Date Noted  . DM (diabetes mellitus), type 2, uncontrolled (HCC) 09/24/2006    Priority: High  . Dyspnea 05/21/2014    Priority: Medium  . Hypertension 03/27/2013    Priority: Medium  . Hyperlipidemia 08/22/2007    Priority: Medium  . Former smoker 07/19/2014    Priority: Low  . S/P TKR (total knee replacement)bilat 08/01/2012    Priority: Low  . OA (osteoarthritis) of knee 07/27/2012    Priority: Low  . GERD 09/24/2006    Priority: Low    Medications- reviewed and updated Current Outpatient Prescriptions  Medication Sig Dispense Refill  . atorvastatin (LIPITOR) 20 MG tablet TAKE ONE TABLET BY MOUTH ONE TIME DAILY 90 tablet 3  . BYDUREON 2 MG PEN INJECT 2 MG INTO THE SKIN ONCE A WEEK. 4 each 5  . glimepiride (AMARYL) 2 MG tablet TAKE ONE TABLET BY MOUTH TWICE DAILY 60 tablet 5  . Insulin Pen Needle 32G X 4 MM MISC 1 pen by Does not apply route 1 day or 1 dose. 100 each 1  . losartan (COZAAR) 100 MG tablet TAKE ONE TABLET BY MOUTH ONE TIME DAILY 30 tablet 5  . metFORMIN (GLUCOPHAGE) 1000 MG tablet TAKE ONE TABLET BY MOUTH TWICE DAILY WITH A MEAL 120 tablet 5  . Multiple Vitamin (MULTIVITAMIN) tablet Take 1 tablet by mouth daily.    Marland Kitchen. omeprazole (PRILOSEC) 20 MG capsule Take 1 capsule (20 mg total) by mouth daily. 30 capsule 1   No current facility-administered medications for this visit.    Objective: BP 140/80 mmHg  Pulse 85  Temp(Src) 98.8 F (37.1 C)  Wt 241 lb (109.317 kg) Gen: NAD, resting comfortably CV: RRR no murmurs rubs or gallops Lungs: CTAB no crackles, wheeze, rhonchi Abdomen: soft/nontender/nondistended/normal bowel sounds. No rebound or guarding. obese Ext: no  edema Skin: warm, dry Neuro: grossly normal, moves all extremities  Assessment/Plan:  DM (diabetes mellitus), type 2, uncontrolled (HCC) S: plan was to stop amaryl given some hypoglycemia episodes but eating has been poor as has exercise. Fasting sugars up to 200 at times. Felt like he had to take it. Sugars have remained high. Mild poor control by a1c.  Lab Results  Component Value Date   HGBA1C 7.1* 01/21/2015  A/P: on Bydureon (glp 1) once weekly, metformin 1000mg  BID, amaryl 2mg  BID. Weight up 11 lbs over 6 months and that is key issue- extended counseling on need for diet and weight loss. avs info provided   Hyperlipidemia S: controlled despite reduction to 10mg  atorv from 20mg  A/P: LDL remains <50. Could consider further reduction in future   Hypertension S: mildly poorly controlled On losartan 100mg   BP Readings from Last 3 Encounters:  01/28/15 140/80  10/22/14 126/74  07/19/14 124/72  A/P:Continue current meds. Has worsened with weight gain- 3 month repeat. We will focus on diet/exercise/weight loss   3 month with a1c before visit Sooner Return precautions advised.   Orders Placed This Encounter  Procedures  . Hemoglobin A1c    Royal    Standing Status: Future     Number of Occurrences:      Standing Expiration Date: 01/28/2016

## 2015-01-28 NOTE — Assessment & Plan Note (Signed)
S: mildly poorly controlled On losartan 100mg   BP Readings from Last 3 Encounters:  01/28/15 140/80  10/22/14 126/74  07/19/14 124/72  A/P:Continue current meds. Has worsened with weight gain- 3 month repeat. We will focus on diet/exercise/weight loss

## 2015-01-28 NOTE — Assessment & Plan Note (Signed)
S: plan was to stop amaryl given some hypoglycemia episodes but eating has been poor as has exercise. Fasting sugars up to 200 at times. Felt like he had to take it. Sugars have remained high. Mild poor control by a1c.  Lab Results  Component Value Date   HGBA1C 7.1* 01/21/2015  A/P: on Bydureon (glp 1) once weekly, metformin 1000mg  BID, amaryl 2mg  BID. Weight up 11 lbs over 6 months and that is key issue- extended counseling on need for diet and weight loss. avs info provided

## 2015-01-28 NOTE — Assessment & Plan Note (Signed)
S: controlled despite reduction to 10mg  atorv from 20mg  A/P: LDL remains <50. Could consider further reduction in future

## 2015-02-15 ENCOUNTER — Encounter: Payer: Self-pay | Admitting: Gastroenterology

## 2015-04-25 ENCOUNTER — Other Ambulatory Visit: Payer: Self-pay | Admitting: Family Medicine

## 2015-04-28 ENCOUNTER — Other Ambulatory Visit: Payer: Self-pay | Admitting: Family Medicine

## 2015-04-29 ENCOUNTER — Other Ambulatory Visit: Payer: Self-pay | Admitting: Family Medicine

## 2015-04-29 ENCOUNTER — Other Ambulatory Visit (INDEPENDENT_AMBULATORY_CARE_PROVIDER_SITE_OTHER): Payer: PRIVATE HEALTH INSURANCE

## 2015-04-29 DIAGNOSIS — IMO0001 Reserved for inherently not codable concepts without codable children: Secondary | ICD-10-CM

## 2015-04-29 DIAGNOSIS — E1165 Type 2 diabetes mellitus with hyperglycemia: Secondary | ICD-10-CM | POA: Diagnosis not present

## 2015-04-29 LAB — HEMOGLOBIN A1C: HEMOGLOBIN A1C: 7.1 % — AB (ref 4.6–6.5)

## 2015-05-03 IMAGING — CR DG CHEST 2V
2 series · 2 of 2 positions shown · non-contrast
Comparison: 07/17/2003

CLINICAL DATA: 58-year-old male with a history of dyspnea on
exertion for 2 years. Hypoxemia. Previous smoking.

EXAM:
CHEST - 2 VIEW

[view not recorded (1 of 2)]
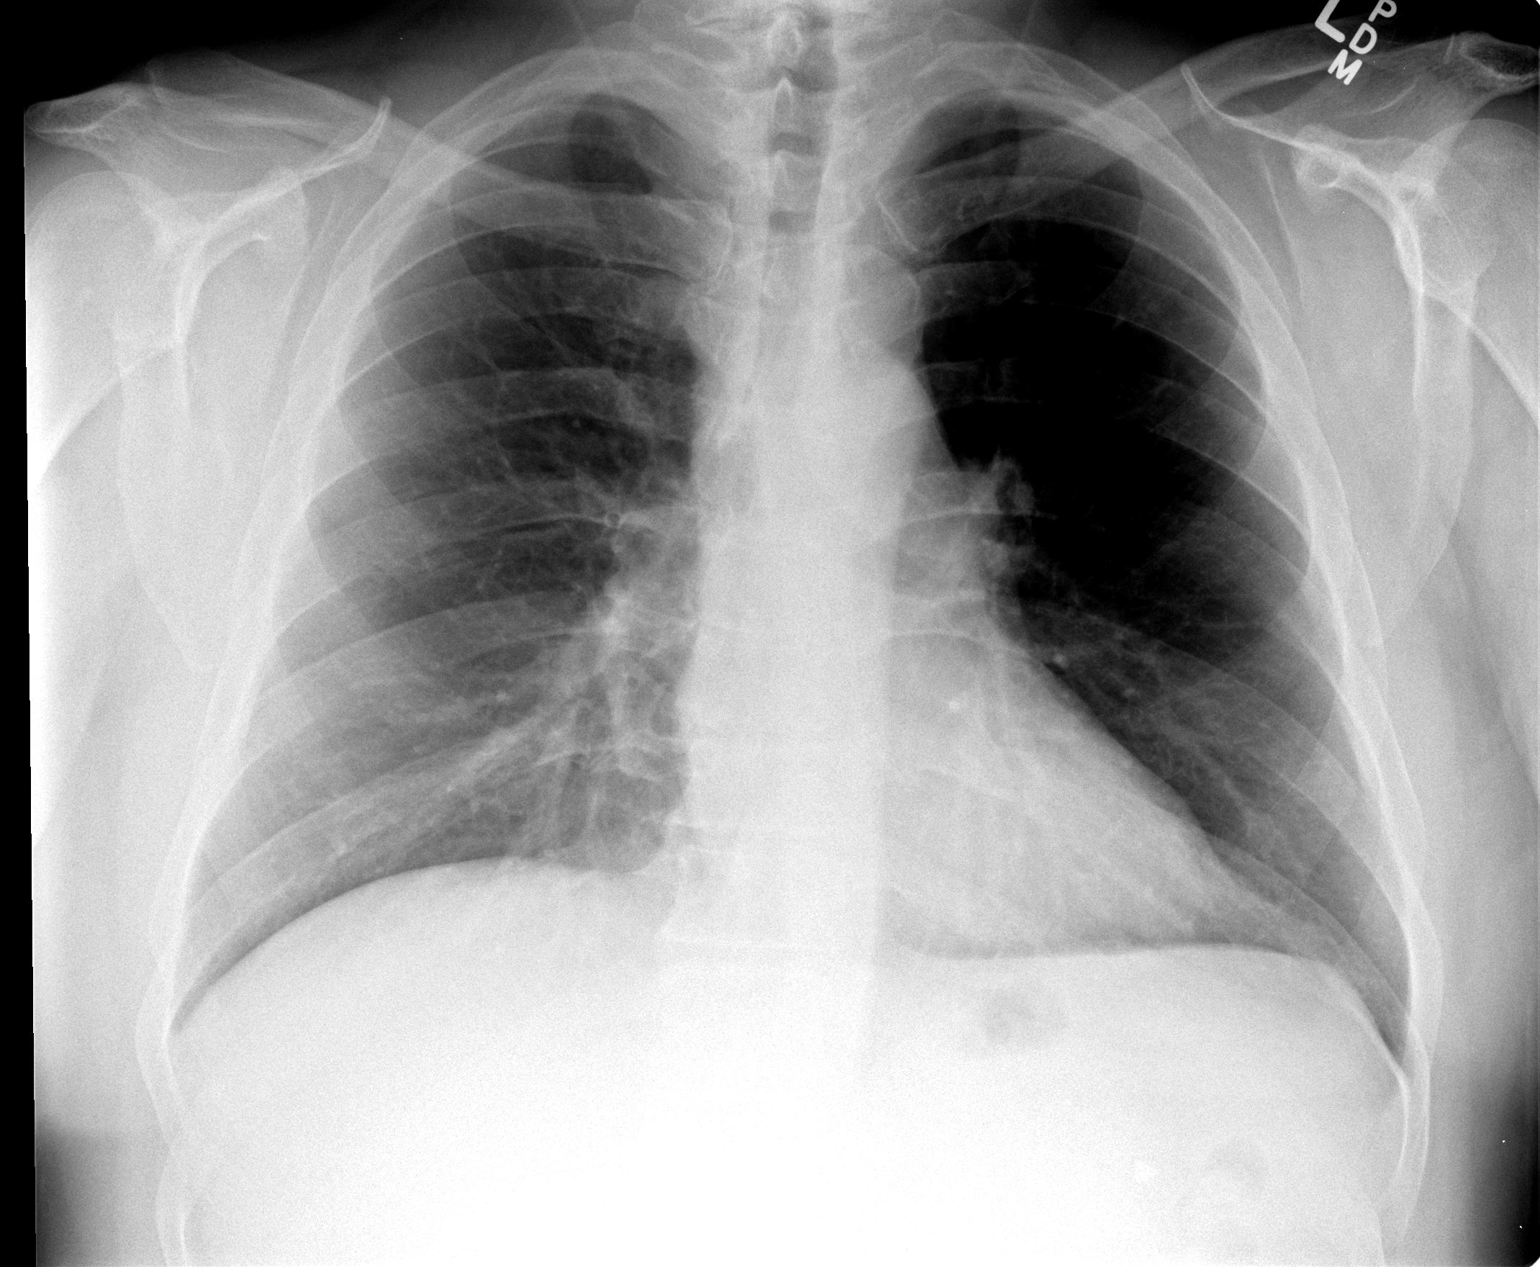

[view not recorded (2 of 2)]
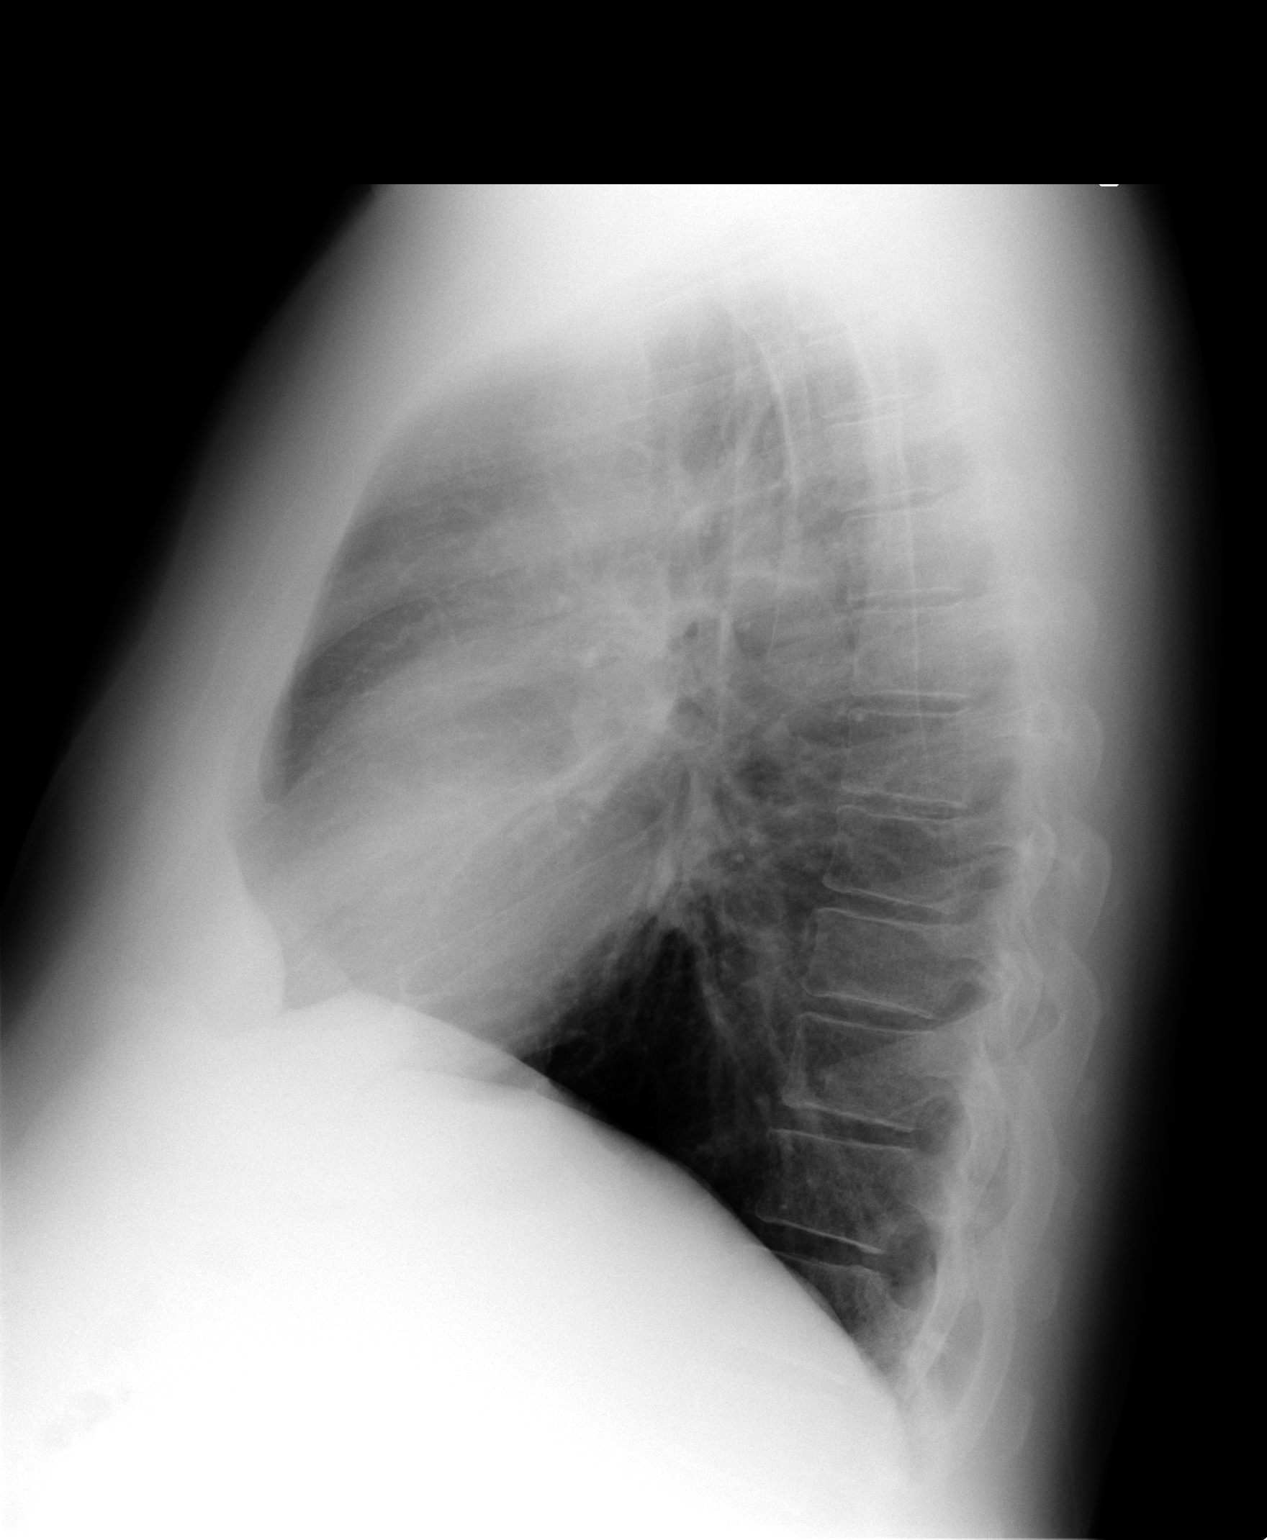

[2 of 2 positions shown; findings below may reference images not displayed]

FINDINGS: Cardiomediastinal silhouette unchanged in size and contour.

Atherosclerotic calcification of the aortic arch.

Lung volumes adequate with no confluent airspace disease. No
pneumothorax or pleural effusion. Interstitial prominence persists,
similar to prior.

No pleural effusion.

No displaced fracture.

Unremarkable appearance of the upper abdomen.
IMPRESSION: No radiographic evidence of acute cardiopulmonary disease, with
background of chronic changes.

Atherosclerosis.

## 2015-05-06 ENCOUNTER — Ambulatory Visit (INDEPENDENT_AMBULATORY_CARE_PROVIDER_SITE_OTHER): Payer: PRIVATE HEALTH INSURANCE | Admitting: Family Medicine

## 2015-05-06 ENCOUNTER — Encounter: Payer: Self-pay | Admitting: Family Medicine

## 2015-05-06 VITALS — BP 120/78 | HR 79 | Temp 98.4°F | Wt 233.0 lb

## 2015-05-06 DIAGNOSIS — E785 Hyperlipidemia, unspecified: Secondary | ICD-10-CM | POA: Diagnosis not present

## 2015-05-06 DIAGNOSIS — I10 Essential (primary) hypertension: Secondary | ICD-10-CM | POA: Diagnosis not present

## 2015-05-06 DIAGNOSIS — E1165 Type 2 diabetes mellitus with hyperglycemia: Secondary | ICD-10-CM | POA: Diagnosis not present

## 2015-05-06 DIAGNOSIS — IMO0001 Reserved for inherently not codable concepts without codable children: Secondary | ICD-10-CM

## 2015-05-06 NOTE — Progress Notes (Signed)
[name], MD  Subjective:  Rickey Anderson is a 60 y.o. year old very pleasant male patient who presents for/with See problem oriented charting ROS- No chest pain. shortness of breath improving as fitness improving. No headache or blurry vision.   Past Medical History-  Patient Active Problem List   Diagnosis Date Noted  . DM (diabetes mellitus), type 2, uncontrolled (HCC) 09/24/2006    Priority: High  . Dyspnea 05/21/2014    Priority: Medium  . Hypertension 03/27/2013    Priority: Medium  . Hyperlipidemia 08/22/2007    Priority: Medium  . Former smoker 07/19/2014    Priority: Low  . S/P TKR (total knee replacement)bilat 08/01/2012    Priority: Low  . OA (osteoarthritis) of knee 07/27/2012    Priority: Low  . GERD 09/24/2006    Priority: Low    Medications- reviewed and updated Current Outpatient Prescriptions  Medication Sig Dispense Refill  . atorvastatin (LIPITOR) 20 MG tablet TAKE ONE TABLET BY MOUTH ONE TIME DAILY 90 tablet 3  . BYDUREON 2 MG PEN INJECT 2 MG INTO THE SKIN ONCE A WEEK. 4 each 5  . glimepiride (AMARYL) 2 MG tablet TAKE ONE TABLET BY MOUTH TWICE DAILY 60 tablet 5  . Insulin Pen Needle 32G X 4 MM MISC 1 pen by Does not apply route 1 day or 1 dose. 100 each 1  . losartan (COZAAR) 100 MG tablet TAKE ONE TABLET BY MOUTH ONE TIME DAILY 30 tablet 5  . metFORMIN (GLUCOPHAGE) 1000 MG tablet TAKE ONE TABLET BY MOUTH TWICE DAILY WITH A MEAL 120 tablet 5  . Multiple Vitamin (MULTIVITAMIN) tablet Take 1 tablet by mouth daily.    Marland Kitchen omeprazole (PRILOSEC) 20 MG capsule Take 1 capsule (20 mg total) by mouth daily. 30 capsule 1   No current facility-administered medications for this visit.    Objective: BP 120/78 mmHg  Pulse 79  Temp(Src) 98.4 F (36.9 C)  Wt 233 lb (105.688 kg) Gen: NAD, resting comfortably CV: RRR no murmurs rubs or gallops Lungs: CTAB no crackles, wheeze, rhonchi Abdomen: soft/nontender/nondistended/normal bowel sounds. No rebound or  guarding. obese Ext: no edema Skin: warm, dry Neuro: grossly normal, moves all extremities  Assessment/Plan:  DM (diabetes mellitus), type 2, uncontrolled (HCC) S: reasonably controlled. On Bydureon once weekly, metformin  BID. Stopped amaryl CBGs- mainly 150s fasting Exercise and diet- much improved- skiing regularly, low carb diet . Down 8 lbs.  Lab Results  Component Value Date   HGBA1C 7.1* 04/29/2015   HGBA1C 7.1* 01/21/2015   HGBA1C 6.2 10/15/2014   A/P: continue without amaryl- follow up in 3 months. Goal another 8 lbs down.   Hypertension S: controlled. On losartan  now with weight loss and improved eating  BP Readings from Last 3 Encounters:  05/06/15 120/78  01/28/15 140/80  10/22/14 126/74  A/P:Continue current medications   Hyperlipidemia S: well controlled on atorvastatin - he decided to stop to see how #s would be. No myalgias.  Lab Results  Component Value Date   CHOL 121 07/10/2013   HDL 43.50 07/10/2013   LDLCALC 57 07/10/2013   LDLDIRECT 30.0 01/21/2015   TRIG 102.0 07/10/2013   CHOLHDL 3 07/10/2013   A/P: Last LDL was 30- we will repeat at physical and see if ok to remain off statin- would likely remain off if LDL <30 and consider if under 100    Return in about 3 months (around 08/03/2015) for see if they have a physical slot-follow up- with  labs a few days before (07/28/15 or later for labs). Return precautions advised.

## 2015-05-06 NOTE — Assessment & Plan Note (Signed)
S: well controlled on atorvastatin - he decided to stop to see how #s would be. No myalgias.  Lab Results  Component Value Date   CHOL 121 07/10/2013   HDL 43.50 07/10/2013   LDLCALC 57 07/10/2013   LDLDIRECT 30.0 01/21/2015   TRIG 102.0 07/10/2013   CHOLHDL 3 07/10/2013   A/P: Last LDL was 30- we will repeat at physical and see if ok to remain off statin- would likely remain off if LDL <30 and consider if under 100

## 2015-05-06 NOTE — Patient Instructions (Addendum)
Get eye exam scheduled and have record sent to [place] at [phone].  Diabetes looks great considering stopped [employer] job with 8 lbs weight loss- lets target another 8 by follow up  Blood pressure looks great as well

## 2015-05-06 NOTE — Assessment & Plan Note (Addendum)
S: reasonably controlled. On Bydureon once weekly, metformin  BID. Stopped amaryl CBGs- mainly 150s fasting Exercise and diet- much improved- skiing regularly, low carb diet . Down 8 lbs.  Lab Results  Component Value Date   HGBA1C 7.1* 04/29/2015   HGBA1C 7.1* 01/21/2015   HGBA1C 6.2 10/15/2014   A/P: continue without amaryl- follow up in 3 months. Goal another 8 lbs down.

## 2015-05-06 NOTE — Assessment & Plan Note (Signed)
S: controlled. On losartan  now with weight loss and improved eating  BP Readings from Last 3 Encounters:  05/06/15 120/78  01/28/15 140/80  10/22/14 126/74  A/P:Continue current medications

## 2015-07-19 ENCOUNTER — Encounter: Payer: Self-pay | Admitting: Family Medicine

## 2015-07-19 ENCOUNTER — Ambulatory Visit (INDEPENDENT_AMBULATORY_CARE_PROVIDER_SITE_OTHER): Payer: PRIVATE HEALTH INSURANCE | Admitting: Family Medicine

## 2015-07-19 VITALS — BP 138/80 | HR 87 | Temp 98.8°F | Wt 233.0 lb

## 2015-07-19 DIAGNOSIS — J069 Acute upper respiratory infection, unspecified: Secondary | ICD-10-CM

## 2015-07-19 MED ORDER — HYDROCOD POLST-CPM POLST ER 10-8 MG/5ML PO SUER: 5.0000 mL | Freq: Two times a day (BID) | ORAL | Status: DC | PRN

## 2015-07-19 NOTE — Patient Instructions (Signed)
Upper Respiratory infection History and exam today are suggestive of viral URI.  We discussed that a serious infection or illness is unlikely. We also discussed reasons why current illness does not meet criteria for bacterial illness and therefore no antibiotics indicated. Also educated on signs that bacterial infection may have developed.   Symptomatic treatment with:  Mucinex-DM in the daytime TUssionex at night (do not take if driving within 8 hours)  Finally, we reviewed reasons to return to care including if symptoms worsen or persist  (past 2 weeks) or new concerns arise (especially fever or shortness of breath)  Meds ordered this encounter  Medications  . chlorpheniramine-HYDROcodone (TUSSIONEX PENNKINETIC ER) 10-8 MG/5ML SUER    Sig: Take 5 mLs by mouth every 12 (twelve) hours as needed for cough.    Dispense:  115 mL    Refill:  0

## 2015-07-19 NOTE — Progress Notes (Signed)
Pre visit review using our clinic review tool, if applicable. No additional management support is needed unless otherwise documented below in the visit note. 

## 2015-07-19 NOTE — Progress Notes (Signed)
PCP: [name], MD  Subjective:  Rickey Anderson is a 60 y.o. year old very pleasant male patient who presents with Upper Respiratory infection symptoms including nasal congestion with some yellow drainage, sore throat (now improved), cough (dry), rhinorrhea clear. Got sick when traveling to [place]. No shortness of breath or leg swelling -started: Wednesday and symptoms  are worsening -previous treatments: some mucinex DM -sick contacts/travel/risks: denies flu exposure.   ROS-denies fever, SOB, NVD, tooth pain or sinus pain  Pertinent Past Medical History-  Patient Active Problem List   Diagnosis Date Noted  . DM (diabetes mellitus), type 2, uncontrolled (HCC) 09/24/2006    Priority: High  . Dyspnea 05/21/2014    Priority: Medium  . Hypertension 03/27/2013    Priority: Medium  . Hyperlipidemia 08/22/2007    Priority: Medium  . Former smoker 07/19/2014    Priority: Low  . S/P TKR (total knee replacement)bilat 08/01/2012    Priority: Low  . OA (osteoarthritis) of knee 07/27/2012    Priority: Low  . GERD 09/24/2006    Priority: Low    Medications- reviewed  Current Outpatient Prescriptions  Medication Sig Dispense Refill  . atorvastatin (LIPITOR) 20 MG tablet TAKE ONE TABLET BY MOUTH ONE TIME DAILY 90 tablet 3  . BYDUREON 2 MG PEN INJECT 2 MG INTO THE SKIN ONCE A WEEK. 4 each 5  . glimepiride (AMARYL) 2 MG tablet TAKE ONE TABLET BY MOUTH TWICE DAILY 60 tablet 5  . losartan (COZAAR) 100 MG tablet TAKE ONE TABLET BY MOUTH ONE TIME DAILY 30 tablet 5  . metFORMIN (GLUCOPHAGE) 1000 MG tablet TAKE ONE TABLET BY MOUTH TWICE DAILY WITH A MEAL 120 tablet 5  . Multiple Vitamin (MULTIVITAMIN) tablet Take 1 tablet by mouth daily.    Marland Kitchen omeprazole (PRILOSEC) 20 MG capsule Take 1 capsule (20 mg total) by mouth daily. 30 capsule 1  . chlorpheniramine-HYDROcodone (TUSSIONEX PENNKINETIC ER) 10-8 MG/5ML SUER Take 5 mLs by mouth every 12 (twelve) hours as needed for cough. 115 mL 0   No  current facility-administered medications for this visit.    Objective: BP 138/80 mmHg  Pulse 87  Temp(Src) 98.8 F (37.1 C) (Oral)  Wt 233 lb (105.688 kg)  SpO2 97% Gen: NAD, resting comfortably HEENT: Turbinates erythematous with some clear and yellow darinage, TM normal, pharynx mildly erythematous with no tonsilar exudate or edema, no sinus tenderness CV: RRR no murmurs rubs or gallops Lungs: CTAB no crackles, wheeze, rhonchi Abdomen: soft/nontender/nondistended/normal bowel sounds. No rebound or guarding.  Ext: no edema Skin: warm, dry, no rash Neuro: grossly normal, moves all extremities  Assessment/Plan:  Upper Respiratory infection History and exam today are suggestive of viral URI.  We discussed that a serious infection or illness is unlikely. We also discussed reasons why current illness does not meet criteria for bacterial illness and therefore no antibiotics indicated. Also educated on signs that bacterial infection may have developed.   Symptomatic treatment with:  Mucinex-DM in the daytime TUssionex at night (do not take if driving within 8 hours)  Finally, we reviewed reasons to return to care including if symptoms worsen or persist  (past 2 weeks) or new concerns arise (especially fever or shortness of breath)  Patient has had good success with Tussionex in past- unclear if covere.d would be willing to change to codeine with mucinex. Main issue is poor sleep due to the cough and hopefully this will help him rest so he can recover.  Meds ordered this encounter  Medications  .  chlorpheniramine-HYDROcodone (TUSSIONEX PENNKINETIC ER) 10-8 MG/5ML SUER    Sig: Take 5 mLs by mouth every 12 (twelve) hours as needed for cough.    Dispense:  115 mL    Refill:  0

## 2015-08-13 ENCOUNTER — Other Ambulatory Visit (INDEPENDENT_AMBULATORY_CARE_PROVIDER_SITE_OTHER): Payer: PRIVATE HEALTH INSURANCE

## 2015-08-13 DIAGNOSIS — Z0000 Encounter for general adult medical examination without abnormal findings: Secondary | ICD-10-CM | POA: Diagnosis not present

## 2015-08-13 DIAGNOSIS — R7989 Other specified abnormal findings of blood chemistry: Secondary | ICD-10-CM | POA: Diagnosis not present

## 2015-08-13 LAB — POC URINALSYSI DIPSTICK (AUTOMATED)
BILIRUBIN UA: NEGATIVE
GLUCOSE UA: NEGATIVE
Ketones, UA: NEGATIVE
Leukocytes, UA: NEGATIVE
NITRITE UA: NEGATIVE
RBC UA: NEGATIVE
SPEC GRAV UA: 1.025
Urobilinogen, UA: 0.2
pH, UA: 5

## 2015-08-13 LAB — LIPID PANEL
Cholesterol: 246 mg/dL — ABNORMAL HIGH (ref 0–200)
HDL: 36.4 mg/dL — AB (ref >39.00)
NONHDL: 210
Total CHOL/HDL Ratio: 7
Triglycerides: 327 mg/dL — ABNORMAL HIGH (ref 0.0–149.0)
VLDL: 65.4 mg/dL — AB (ref 0.0–40.0)

## 2015-08-13 LAB — BASIC METABOLIC PANEL
BUN: 18 mg/dL (ref 6–23)
CHLORIDE: 102 meq/L (ref 96–112)
CO2: 28 meq/L (ref 19–32)
CREATININE: 1.07 mg/dL (ref 0.40–1.50)
Calcium: 10.1 mg/dL (ref 8.4–10.5)
GFR: 74.96 mL/min (ref >60.00)
Glucose, Bld: 132 mg/dL — ABNORMAL HIGH (ref 70–99)
Potassium: 4.6 mEq/L (ref 3.5–5.1)
Sodium: 137 mEq/L (ref 135–145)

## 2015-08-13 LAB — CBC WITH DIFFERENTIAL/PLATELET
BASOS PCT: 0.5 % (ref 0.0–3.0)
Basophils Absolute: 0 10*3/uL (ref 0.0–0.1)
Eosinophils Absolute: 0.3 10*3/uL (ref 0.0–0.7)
Eosinophils Relative: 3.7 % (ref 0.0–5.0)
HCT: 38.4 % — ABNORMAL LOW (ref 39.0–52.0)
Hemoglobin: 13.1 g/dL (ref 13.0–17.0)
LYMPHS PCT: 22.2 % (ref 12.0–46.0)
Lymphs Abs: 2 10*3/uL (ref 0.7–4.0)
MCHC: 34.1 g/dL (ref 30.0–36.0)
MCV: 88.1 fl (ref 78.0–100.0)
Monocytes Absolute: 0.7 10*3/uL (ref 0.1–1.0)
Monocytes Relative: 7.8 % (ref 3.0–12.0)
Neutro Abs: 6 10*3/uL (ref 1.4–7.7)
Neutrophils Relative %: 65.8 % (ref 43.0–77.0)
Platelets: 331 10*3/uL (ref 150.0–400.0)
RBC: 4.36 Mil/uL (ref 4.22–5.81)
RDW: 14.3 % (ref 11.5–15.5)
WBC: 9.2 10*3/uL (ref 4.0–10.5)

## 2015-08-13 LAB — LDL CHOLESTEROL, DIRECT: Direct LDL: 65 mg/dL

## 2015-08-13 LAB — TSH: TSH: 1.33 u[IU]/mL (ref 0.35–4.50)

## 2015-08-13 LAB — PSA: PSA: 0.54 ng/mL (ref 0.10–4.00)

## 2015-08-13 LAB — HEPATIC FUNCTION PANEL
ALK PHOS: 57 U/L (ref 39–117)
ALT: 38 U/L (ref 0–53)
AST: 25 U/L (ref 0–37)
Albumin: 4.2 g/dL (ref 3.5–5.2)
BILIRUBIN DIRECT: 0.1 mg/dL (ref 0.0–0.3)
Total Bilirubin: 0.5 mg/dL (ref 0.2–1.2)
Total Protein: 6.6 g/dL (ref 6.0–8.3)

## 2015-08-13 LAB — MICROALBUMIN / CREATININE URINE RATIO
CREATININE, U: 188.5 mg/dL
MICROALB UR: 2.9 mg/dL — AB (ref 0.0–1.9)
Microalb Creat Ratio: 1.5 mg/g (ref 0.0–30.0)

## 2015-08-13 LAB — HEMOGLOBIN A1C: Hgb A1c MFr Bld: 7.6 % — ABNORMAL HIGH (ref 4.6–6.5)

## 2015-08-15 ENCOUNTER — Telehealth: Payer: Self-pay | Admitting: Family Medicine

## 2015-08-15 NOTE — Telephone Encounter (Signed)
Pt is out of town and had to cancelled his cpx on 08-19-15. Pt had cpx labs on 08-13-15. Can I create 30 min slot for physical with in 30 days ?.Marland Kitchen

## 2015-08-15 NOTE — Telephone Encounter (Signed)
If there is a 30 minute slot that's fine but please avoid merging any SDA or acutes. Thanks!

## 2015-08-16 NOTE — Telephone Encounter (Signed)
noted 

## 2015-08-16 NOTE — Telephone Encounter (Signed)
Pt has been sch for 08-26-15

## 2015-08-19 ENCOUNTER — Encounter: Payer: Self-pay | Admitting: Family Medicine

## 2015-08-19 ENCOUNTER — Ambulatory Visit (INDEPENDENT_AMBULATORY_CARE_PROVIDER_SITE_OTHER): Payer: PRIVATE HEALTH INSURANCE | Admitting: Family Medicine

## 2015-08-19 ENCOUNTER — Encounter: Payer: PRIVATE HEALTH INSURANCE | Admitting: Family Medicine

## 2015-08-19 VITALS — BP 130/90 | HR 84 | Temp 98.2°F | Ht 66.0 in | Wt 236.0 lb

## 2015-08-19 DIAGNOSIS — I10 Essential (primary) hypertension: Secondary | ICD-10-CM | POA: Diagnosis not present

## 2015-08-19 DIAGNOSIS — IMO0001 Reserved for inherently not codable concepts without codable children: Secondary | ICD-10-CM

## 2015-08-19 DIAGNOSIS — Z0001 Encounter for general adult medical examination with abnormal findings: Secondary | ICD-10-CM | POA: Diagnosis not present

## 2015-08-19 DIAGNOSIS — E785 Hyperlipidemia, unspecified: Secondary | ICD-10-CM

## 2015-08-19 DIAGNOSIS — E1165 Type 2 diabetes mellitus with hyperglycemia: Secondary | ICD-10-CM | POA: Diagnosis not present

## 2015-08-19 MED ORDER — CANAGLIFLOZIN 100 MG PO TABS: 100.0000 mg | ORAL_TABLET | Freq: Every day | ORAL | Status: DC

## 2015-08-19 NOTE — Assessment & Plan Note (Signed)
S: slightly worsened from 3 months ago up to 7.6 on bydureon once weekly, metformin 1000mg  BID. Had stopped amaryl previously and a1c remained at 7.1- had been down 8 lbs at that time. Today up 3 lbs. Reports he has taken amaryl 2mg  in daytime but if he is eating reasonably well almost always gets hypoglycemia into 60s and 70s and feels terrible  Lab Results  Component Value Date   HGBA1C 7.6* 08/13/2015  A/P: continue bydureon and metformin 1000mg  BID. Add invokana 100mg - hopeful this can get patient to goal. Considered [place] but evidence week in combo with bydureon and not approved fda combo.

## 2015-08-19 NOTE — Progress Notes (Signed)
Phone: [phone]  Subjective:  Patient presents today for their annual physical. Chief complaint-noted.   See problem oriented charting- ROS- full  review of systems was completed and negative except for hypoglycemia symptoms if eating well and takes glimepiride   The following were reviewed and entered/updated in epic: Past Medical History  Diagnosis Date  . GERD (gastroesophageal reflux disease)   . Diabetes mellitus   . Hyperlipidemia   . Herpes   . Norovirus     1'14- no reoccurring issues  . Alcohol abuse, in remission   . Nonspecific elevation of level of transaminase or lactic acid dehydrogenase (LDH) 11/04/2011    Former alcohol use. Some fatty liver. Resolved 2015 LFTs   . Right-sided low back pain without sciatica 02/05/2014    Prednisone from Dr. Shon Baton at [facility]. Resolved with prednisone.     Patient Active Problem List   Diagnosis Date Noted  . DM (diabetes mellitus), type 2, uncontrolled (HCC) 09/24/2006    Priority: High  . Dyspnea 05/21/2014    Priority: Medium  . Hypertension 03/27/2013    Priority: Medium  . Hyperlipidemia 08/22/2007    Priority: Medium  . Former smoker 07/19/2014    Priority: Low  . S/P TKR (total knee replacement)bilat 08/01/2012    Priority: Low  . OA (osteoarthritis) of knee 07/27/2012    Priority: Low  . GERD 09/24/2006    Priority: Low   Past Surgical History  Procedure Laterality Date  . Knee surgery Bilateral     both knees-scopes  . Tonsillectomy    . Functional endoscopic sinus surgery      Deviated septum repair 10'13  . Vasectomy    . Eardrum      hx. perforated ear drum surgery right  . 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 Hypertension Father   . Heart disease Father     Amyloidosis  . Depression    .  Prostate cancer Neg Hx   . Colon cancer Neg Hx     Medications- reviewed and updated Current Outpatient Prescriptions  Medication Sig Dispense Refill  . atorvastatin (LIPITOR) 20 MG tablet TAKE ONE TABLET BY MOUTH ONE TIME DAILY 90 tablet 3  . BYDUREON 2 MG PEN INJECT 2 MG INTO THE SKIN ONCE A WEEK. 4 each 5  . chlorpheniramine-HYDROcodone (TUSSIONEX PENNKINETIC ER) 10-8 MG/5ML SUER Take 5 mLs by mouth every 12 (twelve) hours as needed for cough. 115 mL 0  . glimepiride (AMARYL) 2 MG tablet TAKE ONE TABLET BY MOUTH TWICE DAILY 60 tablet 5  . losartan (COZAAR) 100 MG tablet TAKE ONE TABLET BY MOUTH ONE TIME DAILY 30 tablet 5  . metFORMIN (GLUCOPHAGE) 1000 MG tablet TAKE ONE TABLET BY MOUTH TWICE DAILY WITH A MEAL 120 tablet 5  . Multiple Vitamin (MULTIVITAMIN) tablet Take 1 tablet by mouth daily.    Marland Kitchen omeprazole (PRILOSEC) 20 MG capsule Take 1 capsule (20 mg total) by mouth daily. 30 capsule 1   No current facility-administered medications for this visit.    Allergies-reviewed and updated No Known Allergies  Social History   Social History  . Marital Status: Married    Spouse Name: N/A  . Number of Children: N/A  . Years of Education: N/A   Occupational History  . Leisure centre manager  [employer]   Social History Main Topics  . Smoking status: Former Smoker    Quit date: 07/18/1997  . Smokeless tobacco: Never Used  . Alcohol Use: No  . Drug Use: No  . Sexual Activity: Yes    Birth Control/ Protection: None   Other Topics Concern  . None   Social History Narrative   Married 17 years in 2017. 2 children - son, daugther. 1 stepchild. 1 stepgrandson 92 years old. 1 granddaughter 48 year old in 2017. Daughter to be getting married soon- 53 year old stepchild.     Objective: BP 130/90 mmHg  Pulse 84  Temp(Src) 98.2 F (36.8 C) (Oral)  Ht 5\' 6"  (1.676 m)  Wt 236 lb (107.049 kg)  BMI 38.11 kg/m2 Gen: NAD, resting comfortably HEENT: Mucous membranes are moist.  Oropharynx normal Neck: no thyromegaly CV: RRR no murmurs rubs or gallops Lungs: CTAB no crackles, wheeze, rhonchi Abdomen: soft/nontender/nondistended/normal bowel sounds. No rebound or guarding. obese Ext: no edema Skin: warm, dry Neuro: grossly normal, moves all extremities, PERRLA Rectal: declined- he agrees to this at next years visit  Assessment/Plan:  60 y.o. male presenting for annual physical.  Health Maintenance counseling: 1. Anticipatory guidance: Patient counseled regarding regular dental exams, eye exams, wearing seatbelts.  2. Risk factor reduction:  Advised patient of need for regular exercise (just restarted 2 weeks ago- had a few weeks off) and diet rich and fruits and vegetables to reduce risk of heart attack and stroke. Had lost more weight than previously noted then gained 10 lbs- knows how to reverse this.  3. Immunizations/screenings/ancillary studies Immunization History  Administered Date(s) Administered  . Influenza,inj,Quad PF,36+ Mos 01/29/2014  . Pneumococcal Polysaccharide-23 07/12/2013  . Td 04/16/1997, 08/22/2007   Health Maintenance Due  Topic Date Due  . OPHTHALMOLOGY EXAM - advised to update at last visit.  10/14/2014   4. Prostate cancer screening- low risk based off psa trend and rectal exam declined today- agrees to do this next year  Lab Results  Component Value Date   PSA 0.54 08/13/2015   PSA 0.57 07/10/2013   PSA 0.51 01/04/2012   5. Colon cancer screening - 09/05/07 normal with 10 year repeat (1 polyp- polypoid rectal mucosa without adenomatous change).  6. Skin cancer screening- yearly visits- no issues. 2 benign lesions removed.   Status of chronic or acute concerns   DM (diabetes mellitus), type 2, uncontrolled (HCC) S: slightly worsened from 3 months ago up to 7.6 on bydureon once weekly, metformin 1000mg  BID. Had stopped amaryl previously and a1c remained at 7.1- had been down 8 lbs at that time. Today up 3 lbs. Reports he has taken  amaryl 2mg  in daytime but if he is eating reasonably well almost always gets hypoglycemia into 60s and 70s and feels terrible  Lab Results  Component Value Date   HGBA1C 7.6* 08/13/2015  A/P: continue bydureon and metformin 1000mg  BID. Add invokana 100mg - hopeful this can get patient to goal. Considered [place] but evidence week in combo with bydureon and not approved fda combo.    Hypertension S: controlled on losartan 100mg  previously- up some with weight up.  BP Readings from Last 3 Encounters:  08/19/15 130/90  07/19/15 138/80  05/06/15 120/78  A/P: Trend at follow up- adding  invokana may help some. Also advised weight loss effort  Hyperlipidemia S:  LDL controlled off of atorvastatin 10mg  but triglycerides onver 200 indicating potential beneit from restarting statin-. . No myalgias.  Lab Results  Component Value Date   CHOL 246* 08/13/2015   HDL 36.40* 08/13/2015   LDLCALC 57 07/10/2013   LDLDIRECT 65.0 08/13/2015   TRIG 327.0* 08/13/2015   CHOLHDL 7 08/13/2015   A/P:  advised to restart statin- atorvastatin 10mg      3 months and a day. Return precautions advised.   Orders Placed This Encounter  Procedures  . Hemoglobin A1c    New Alluwe    Standing Status: Future     Number of Occurrences:      Standing Expiration Date: 08/18/2016  . Comprehensive metabolic panel        Standing Status: Future     Number of Occurrences:      Standing Expiration Date: 08/18/2016   Advised minimal mobic from outside provider Meds ordered this encounter  Medications  . meloxicam (MOBIC) 7.5 MG tablet    Sig: 7.5 mg daily as needed.    Refill:  0  . canagliflozin (INVOKANA) 100 MG TABS tablet    Sig: Take 1 tablet (100 mg total) by mouth daily before breakfast.    Dispense:  30 tablet    Refill:  11    Tana ConchStephen Hunter, MD

## 2015-08-19 NOTE — Assessment & Plan Note (Signed)
S:  LDL controlled off of atorvastatin 10mg  but triglycerides onver 200 indicating potential beneit from restarting statin-. . No myalgias.  Lab Results  Component Value Date   CHOL 246* 08/13/2015   HDL 36.40* 08/13/2015   LDLCALC 57 07/10/2013   LDLDIRECT 65.0 08/13/2015   TRIG 327.0* 08/13/2015   CHOLHDL 7 08/13/2015   A/P:  advised to restart statin- atorvastatin 10mg 

## 2015-08-19 NOTE — Patient Instructions (Addendum)
Health Maintenance Due  Topic Date Due  . OPHTHALMOLOGY EXAM - advised to update and send us a copy 10/14/2014   Have to continue weight loss efforts- will help with blood pressure, diabetes, cholesterol  Restart atorvastatin full pill of 10mg   Start invokana 100mg  (lower of 2 doses).   FOllow up 3 months and a day for labs then see me a few days later. Check kidneys and a1c. HAVE to stay well hydrated on this medicine.

## 2015-08-19 NOTE — Assessment & Plan Note (Signed)
S: controlled on losartan 100mg  previously- up some with weight up.  BP Readings from Last 3 Encounters:  08/19/15 130/90  07/19/15 138/80  05/06/15 120/78  A/P: Trend at follow up- adding  invokana may help some. Also advised weight loss effort

## 2015-08-26 ENCOUNTER — Encounter: Payer: PRIVATE HEALTH INSURANCE | Admitting: Family Medicine

## 2015-08-27 ENCOUNTER — Telehealth: Payer: Self-pay | Admitting: Family Medicine

## 2015-08-27 NOTE — Telephone Encounter (Signed)
Pt said he has been feeling to good so he has been checking his bp and it has been running low. He is asking if should reduce his bp medicine. Would like call back.    [phone]

## 2015-08-27 NOTE — Telephone Encounter (Signed)
Spoke with patient regarding tracking blood pressure and sending a My Chart message to Dr. Durene CalHunter with documented blood pressures. Advised after Dr. Durene CalHunter reviewed he could advise regarding blood pressure meds. Patient verbalized understanding.

## 2015-08-27 NOTE — Telephone Encounter (Signed)
Need concrete numbers- can he send us a list through mychart perhaps?

## 2015-10-08 ENCOUNTER — Other Ambulatory Visit: Payer: Self-pay | Admitting: Emergency Medicine

## 2015-10-08 MED ORDER — EXENATIDE ER 2 MG ~~LOC~~ PEN: PEN_INJECTOR | SUBCUTANEOUS | 5 refills | Status: DC

## 2015-10-15 ENCOUNTER — Other Ambulatory Visit: Payer: Self-pay | Admitting: Internal Medicine

## 2015-11-17 ENCOUNTER — Other Ambulatory Visit: Payer: Self-pay | Admitting: Family Medicine

## 2015-11-19 ENCOUNTER — Other Ambulatory Visit (INDEPENDENT_AMBULATORY_CARE_PROVIDER_SITE_OTHER): Payer: PRIVATE HEALTH INSURANCE

## 2015-11-19 ENCOUNTER — Encounter: Payer: Self-pay | Admitting: Family Medicine

## 2015-11-19 DIAGNOSIS — E1165 Type 2 diabetes mellitus with hyperglycemia: Secondary | ICD-10-CM | POA: Diagnosis not present

## 2015-11-19 DIAGNOSIS — IMO0001 Reserved for inherently not codable concepts without codable children: Secondary | ICD-10-CM

## 2015-11-19 LAB — COMPREHENSIVE METABOLIC PANEL
ALBUMIN: 4.3 g/dL (ref 3.5–5.2)
ALT: 30 U/L (ref 0–53)
AST: 26 U/L (ref 0–37)
Alkaline Phosphatase: 54 U/L (ref 39–117)
BUN: 16 mg/dL (ref 6–23)
CALCIUM: 10.1 mg/dL (ref 8.4–10.5)
CHLORIDE: 102 meq/L (ref 96–112)
CO2: 27 meq/L (ref 19–32)
CREATININE: 1.32 mg/dL (ref 0.40–1.50)
GFR: 58.78 mL/min — AB (ref >60.00)
Glucose, Bld: 190 mg/dL — ABNORMAL HIGH (ref 70–99)
POTASSIUM: 4.3 meq/L (ref 3.5–5.1)
Sodium: 139 mEq/L (ref 135–145)
Total Bilirubin: 0.4 mg/dL (ref 0.2–1.2)
Total Protein: 7.1 g/dL (ref 6.0–8.3)

## 2015-11-19 LAB — HEMOGLOBIN A1C: Hgb A1c MFr Bld: 7.1 % — ABNORMAL HIGH (ref 4.6–6.5)

## 2015-11-25 ENCOUNTER — Other Ambulatory Visit: Payer: PRIVATE HEALTH INSURANCE

## 2015-12-02 ENCOUNTER — Ambulatory Visit (INDEPENDENT_AMBULATORY_CARE_PROVIDER_SITE_OTHER): Payer: PRIVATE HEALTH INSURANCE | Admitting: Family Medicine

## 2015-12-02 ENCOUNTER — Encounter: Payer: Self-pay | Admitting: Family Medicine

## 2015-12-02 VITALS — BP 120/88 | HR 82 | Temp 98.4°F | Wt 238.2 lb

## 2015-12-02 DIAGNOSIS — IMO0001 Reserved for inherently not codable concepts without codable children: Secondary | ICD-10-CM

## 2015-12-02 DIAGNOSIS — E1165 Type 2 diabetes mellitus with hyperglycemia: Secondary | ICD-10-CM

## 2015-12-02 DIAGNOSIS — F411 Generalized anxiety disorder: Secondary | ICD-10-CM | POA: Insufficient documentation

## 2015-12-02 DIAGNOSIS — I10 Essential (primary) hypertension: Secondary | ICD-10-CM

## 2015-12-02 DIAGNOSIS — N529 Male erectile dysfunction, unspecified: Secondary | ICD-10-CM

## 2015-12-02 DIAGNOSIS — E785 Hyperlipidemia, unspecified: Secondary | ICD-10-CM

## 2015-12-02 MED ORDER — SILDENAFIL CITRATE 100 MG PO TABS: 100.0000 mg | ORAL_TABLET | Freq: Every day | ORAL | 11 refills | Status: DC | PRN

## 2015-12-02 MED ORDER — LORAZEPAM 0.5 MG PO TABS: 0.5000 mg | ORAL_TABLET | Freq: Every day | ORAL | 0 refills | Status: DC | PRN

## 2015-12-02 NOTE — Patient Instructions (Signed)
Try to do as little mobic as possible  See me in 3 months- schedule labs at least 3 months and a day past yours this itme  Weight loss is going to be key still- really need to tighten diet up. Want blood pressure better- between that and mobic are my guesses for whats hurting kidneys  Trial viagra  Ativan once a week maximum- enough for 16 weeks

## 2015-12-02 NOTE — Progress Notes (Signed)
Subjective:  Rickey Anderson is a 60 y.o. year old very pleasant male patient who presents for/with See problem oriented charting ROS- No chest pain or shortness of breath. No headache or blurry vision. see any ROS included in HPI as well.   Past Medical History-  Patient Active Problem List   Diagnosis Date Noted  . DM (diabetes mellitus), type 2, uncontrolled (HCC) 09/24/2006    Priority: High  . Anxiety state 12/02/2015    Priority: Medium  . Dyspnea 05/21/2014    Priority: Medium  . Hypertension 03/27/2013    Priority: Medium  . Hyperlipidemia 08/22/2007    Priority: Medium  . ED (erectile dysfunction) 12/02/2015    Priority: Low  . Former smoker 07/19/2014    Priority: Low  . S/P TKR (total knee replacement)bilat 08/01/2012    Priority: Low  . OA (osteoarthritis) of knee 07/27/2012    Priority: Low  . GERD 09/24/2006    Priority: Low    Medications- reviewed and updated Current Outpatient Prescriptions  Medication Sig Dispense Refill  . atorvastatin (LIPITOR) 20 MG tablet TAKE ONE TABLET BY MOUTH ONE TIME DAILY 90 tablet 2  . Exenatide ER (BYDUREON) 2 MG PEN INJECT 2 MG INTO THE SKIN ONCE A WEEK. 4 each 5  . losartan (COZAAR) 100 MG tablet TAKE ONE TABLET BY MOUTH ONE TIME DAILY 30 tablet 5  . meloxicam (MOBIC) 7.5 MG tablet 7.5 mg daily as needed.  0  . metFORMIN (GLUCOPHAGE) 1000 MG tablet TAKE ONE TABLET BY MOUTH TWICE DAILY WITH A MEAL 120 tablet 5  . Multiple Vitamin (MULTIVITAMIN) tablet Take 1 tablet by mouth daily.    Marland Kitchen omeprazole (PRILOSEC) 20 MG capsule Take 1 capsule (20 mg total) by mouth daily. 30 capsule 1  . LORazepam (ATIVAN) 0.5 MG tablet Take 1 tablet (0.5 mg total) by mouth daily as needed for anxiety (1 per week maximum). 16 tablet 0  . sildenafil (VIAGRA) 100 MG tablet Take 1 tablet (100 mg total) by mouth daily as needed for erectile dysfunction. 10 tablet 11   No current facility-administered medications for this visit.     Objective: BP  120/88 (BP Location: Left Arm, Patient Position: Sitting, Cuff Size: Large)   Pulse 82   Temp 98.4 F (36.9 C) (Oral)   Wt 238 lb 3.2 oz (108 kg)   SpO2 94%   BMI 38.45 kg/m  Gen: NAD, resting comfortably CV: RRR no murmurs rubs or gallops Lungs: CTAB no crackles, wheeze, rhonchi Abdomen: soft/nontender/nondistended/normal bowel sounds. Obese Ext: no edema Skin: warm, dry Neuro: grossly normal, moves all extremities  Assessment/Plan:  DM (diabetes mellitus), type 2, uncontrolled (HCC) S: reasonably controlled. On bydureon and metformin 1000mg  BID. invokana- did not tolerate. Takes 1/2 an amaryl if sugar >150 fasting. Full pill if going to have a bad meal or chest on foods.  Exercise and diet- has been doing [employer] Lab Results  Component Value Date   HGBA1C 7.1 (H) 11/19/2015   HGBA1C 7.6 (H) 08/13/2015   HGBA1C 7.1 (H) 04/29/2015   A/P: continue bydureon and metformin 1g BID. His amaryl regimen is actually reasonable and may continue for now. Stressed importance of cleaning up diet and weight loss.   Hypertension S: controlled on losartan 100mg  BP Readings from Last 3 Encounters:  12/02/15 120/88  08/19/15 130/90  07/19/15 138/80  A/P:Continue current meds:  Noted that diastolic is rather high though- we stressed weight loss given lack of progress to this point although fitness  level is reasonable  Wt Readings from Last 3 Encounters:  12/02/15 238 lb 3.2 oz (108 kg)  08/19/15 236 lb (107 kg)  07/19/15 233 lb (105.7 kg)     ED (erectile dysfunction) S: having some libido issues and hard time getting full erection.  A/P: trial viagra   Anxiety state S: patient states about once a week he gets particularly anxious at work especially with meetings. He requests xanax or similar to help him . Does not want to take daily meds for this such as SSRI.he is in corporate high stress world A/P: did provide #16 ativan to last at least 16  weeks   Hyperlipidemia S:compliant with statin atorvastatin 10mg  after last visit A/P:  LDL at goal <70- continue current meds  3 months and a day  Orders Placed This Encounter  Procedures  . Basic metabolic panel    Standing Status:   Future    Standing Expiration Date:   12/01/2016  . Hemoglobin A1c    [place]    Standing Status:   Future    Standing Expiration Date:   12/01/2016    Meds ordered this encounter  Medications  . sildenafil (VIAGRA) 100 MG tablet    Sig: Take 1 tablet (100 mg total) by mouth daily as needed for erectile dysfunction.    Dispense:  10 tablet    Refill:  11  . LORazepam (ATIVAN) 0.5 MG tablet    Sig: Take 1 tablet (0.5 mg total) by mouth daily as needed for anxiety (1 per week maximum).    Dispense:  16 tablet    Refill:  0    Return precautions advised.  Tana ConchStephen Adyson Vanburen, MD

## 2015-12-02 NOTE — Assessment & Plan Note (Signed)
S:compliant with statin atorvastatin 10mg  after last visit A/P:  LDL at goal <70- continue current meds

## 2015-12-02 NOTE — Assessment & Plan Note (Signed)
S: having some libido issues and hard time getting full erection.  A/P: trial viagra

## 2015-12-02 NOTE — Assessment & Plan Note (Signed)
S: patient states about once a week he gets particularly anxious at work especially with meetings. He requests xanax or similar to help him . Does not want to take daily meds for this such as SSRI.he is in corporate high stress world A/P: did provide #16 ativan to last at least 16 weeks

## 2015-12-02 NOTE — Progress Notes (Signed)
Pre visit review using our clinic review tool, if applicable. No additional management support is needed unless otherwise documented below in the visit note. 

## 2015-12-02 NOTE — Assessment & Plan Note (Addendum)
S: controlled on losartan 100mg  BP Readings from Last 3 Encounters:  12/02/15 120/88  08/19/15 130/90  07/19/15 138/80  A/P:Continue current meds:  Noted that diastolic is rather high though- we stressed weight loss given lack of progress to this point although fitness level is reasonable  Wt Readings from Last 3 Encounters:  12/02/15 238 lb 3.2 oz (108 kg)  08/19/15 236 lb (107 kg)  07/19/15 233 lb (105.7 kg)

## 2015-12-02 NOTE — Assessment & Plan Note (Signed)
S: reasonably controlled. On bydureon and metformin 1000mg  BID. invokana- did not tolerate. Takes 1/2 an amaryl if sugar >150 fasting. Full pill if going to have a bad meal or chest on foods.  Exercise and diet- has been doing [employer] Lab Results  Component Value Date   HGBA1C 7.1 (H) 11/19/2015   HGBA1C 7.6 (H) 08/13/2015   HGBA1C 7.1 (H) 04/29/2015   A/P: continue bydureon and metformin 1g BID. His amaryl regimen is actually reasonable and may continue for now. Stressed importance of cleaning up diet and weight loss.

## 2015-12-21 ENCOUNTER — Other Ambulatory Visit: Payer: Self-pay | Admitting: Family Medicine

## 2016-01-28 ENCOUNTER — Telehealth: Payer: Self-pay | Admitting: Family Medicine

## 2016-01-28 NOTE — Telephone Encounter (Signed)
Pt request refill  LORazepam (ATIVAN) 0.5 MG tablet  [facility] [address] / [place]

## 2016-01-28 NOTE — Telephone Encounter (Signed)
Yes thanks, may provide #16 but please tell patient that he will need an appointment if this does not last him at least 2 months.

## 2016-01-30 ENCOUNTER — Other Ambulatory Visit: Payer: Self-pay

## 2016-01-30 MED ORDER — LORAZEPAM 0.5 MG PO TABS: 0.5000 mg | ORAL_TABLET | Freq: Every day | ORAL | 0 refills | Status: DC | PRN

## 2016-01-30 NOTE — Telephone Encounter (Signed)
Patient handed prescription her in office. Instructed patient that this must last or he needs to make an appointment

## 2016-03-02 ENCOUNTER — Other Ambulatory Visit (INDEPENDENT_AMBULATORY_CARE_PROVIDER_SITE_OTHER): Payer: PRIVATE HEALTH INSURANCE

## 2016-03-02 DIAGNOSIS — E1165 Type 2 diabetes mellitus with hyperglycemia: Secondary | ICD-10-CM | POA: Diagnosis not present

## 2016-03-02 DIAGNOSIS — IMO0001 Reserved for inherently not codable concepts without codable children: Secondary | ICD-10-CM

## 2016-03-02 LAB — BASIC METABOLIC PANEL
BUN: 14 mg/dL (ref 6–23)
CHLORIDE: 98 meq/L (ref 96–112)
CO2: 31 meq/L (ref 19–32)
CREATININE: 1.04 mg/dL (ref 0.40–1.50)
Calcium: 10.2 mg/dL (ref 8.4–10.5)
GFR: 77.32 mL/min (ref >60.00)
Glucose, Bld: 199 mg/dL — ABNORMAL HIGH (ref 70–99)
POTASSIUM: 4.8 meq/L (ref 3.5–5.1)
Sodium: 136 mEq/L (ref 135–145)

## 2016-03-02 LAB — HEMOGLOBIN A1C: Hgb A1c MFr Bld: 7.5 % — ABNORMAL HIGH (ref 4.6–6.5)

## 2016-03-05 ENCOUNTER — Other Ambulatory Visit: Payer: Self-pay | Admitting: Family Medicine

## 2016-03-06 ENCOUNTER — Ambulatory Visit (INDEPENDENT_AMBULATORY_CARE_PROVIDER_SITE_OTHER): Payer: PRIVATE HEALTH INSURANCE | Admitting: Family Medicine

## 2016-03-06 ENCOUNTER — Encounter: Payer: Self-pay | Admitting: Family Medicine

## 2016-03-06 VITALS — BP 118/84 | HR 86 | Temp 97.7°F | Wt 243.6 lb

## 2016-03-06 DIAGNOSIS — IMO0001 Reserved for inherently not codable concepts without codable children: Secondary | ICD-10-CM

## 2016-03-06 DIAGNOSIS — I10 Essential (primary) hypertension: Secondary | ICD-10-CM

## 2016-03-06 DIAGNOSIS — E1165 Type 2 diabetes mellitus with hyperglycemia: Secondary | ICD-10-CM | POA: Diagnosis not present

## 2016-03-06 DIAGNOSIS — F411 Generalized anxiety disorder: Secondary | ICD-10-CM

## 2016-03-06 DIAGNOSIS — E785 Hyperlipidemia, unspecified: Secondary | ICD-10-CM | POA: Diagnosis not present

## 2016-03-06 MED ORDER — LORAZEPAM 1 MG PO TABS: 0.5000 mg | ORAL_TABLET | Freq: Every day | ORAL | 0 refills | Status: DC | PRN

## 2016-03-06 MED ORDER — LORAZEPAM 1 MG PO TABS: 1.0000 mg | ORAL_TABLET | Freq: Every day | ORAL | 0 refills | Status: DC | PRN

## 2016-03-06 NOTE — Assessment & Plan Note (Signed)
S: controlled on losartan 100mg - on repeat. .  BP Readings from Last 3 Encounters:  03/06/16 118/84  12/02/15 120/88  08/19/15 130/90  A/P:Continue current meds:  Though we discussed new guidelines- would push for lower weight

## 2016-03-06 NOTE — Assessment & Plan Note (Signed)
High stress meetings- 0.5mg  ativan not helpful- took 2 and helpful. Agreed to one per week 1mg  dosing- took off max once a week as they would only fill 4 at a time at pharmacy

## 2016-03-06 NOTE — Addendum Note (Signed)
Addended by: Shelva MajesticHUNTER, Anoop O on: 03/06/2016 09:23 AM   Modules accepted: Orders

## 2016-03-06 NOTE — Patient Instructions (Addendum)
If you have had your eye exam within the last year, please sign release of information at check out desk. If you have not had an eye exam within a year, please get one at this time as this is important for your diabetes care  We will wait on shingles vaccine until new one comes out because looks to be better vaccine  Can do fingerprick a1c next visit since kidneys look better

## 2016-03-06 NOTE — Progress Notes (Signed)
Subjective:  Abelino DerrickStephen C Wunder is a 60 y.o. year old very pleasant male patient who presents for/with See problem oriented charting ROS- No chest pain or shortness of breath. No headache or blurry vision. No hypoglycemia. Has been eating poorly   Past Medical History-  Patient Active Problem List   Diagnosis Date Noted  . DM (diabetes mellitus), type 2, uncontrolled (HCC) 09/24/2006    Priority: High  . Anxiety state 12/02/2015    Priority: Medium  . Dyspnea 05/21/2014    Priority: Medium  . Hypertension 03/27/2013    Priority: Medium  . Hyperlipidemia 08/22/2007    Priority: Medium  . ED (erectile dysfunction) 12/02/2015    Priority: Low  . Former smoker 07/19/2014    Priority: Low  . S/P TKR (total knee replacement)bilat 08/01/2012    Priority: Low  . OA (osteoarthritis) of knee 07/27/2012    Priority: Low  . GERD 09/24/2006    Priority: Low    Medications- reviewed and updated Current Outpatient Prescriptions  Medication Sig Dispense Refill  . atorvastatin (LIPITOR) 20 MG tablet TAKE ONE TABLET BY MOUTH ONE TIME DAILY 90 tablet 2  . BYDUREON 2 MG PEN INJECT 2 MG INTO THE SKIN ONCE A WEEK AS DIRECTED 4 each 5  . glimepiride (AMARYL) 2 MG tablet TAKE ONE TABLET BY MOUTH TWICE DAILY 60 tablet 4  . LORazepam (ATIVAN) 1 MG tablet Take 0.5 tablets (0.5 mg total) by mouth daily as needed for anxiety. 16 tablet 0  . losartan (COZAAR) 100 MG tablet TAKE ONE TABLET BY MOUTH ONE TIME DAILY 30 tablet 5  . meloxicam (MOBIC) 7.5 MG tablet 7.5 mg daily as needed.  0  . metFORMIN (GLUCOPHAGE) 1000 MG tablet TAKE ONE TABLET BY MOUTH TWICE DAILY WITH A MEAL 120 tablet 5  . Multiple Vitamin (MULTIVITAMIN) tablet Take 1 tablet by mouth daily.    Marland Kitchen. omeprazole (PRILOSEC) 20 MG capsule Take 1 capsule (20 mg total) by mouth daily. 30 capsule 1  . sildenafil (VIAGRA) 100 MG tablet Take 1 tablet (100 mg total) by mouth daily as needed for erectile dysfunction. 10 tablet 11   No current  facility-administered medications for this visit.     Objective: BP 118/84   Pulse 86   Temp 97.7 F (36.5 C) (Oral)   Wt 243 lb 9.6 oz (110.5 kg)   SpO2 96%   BMI 39.32 kg/m  Gen: NAD, resting comfortably CV: RRR no murmurs rubs or gallops Lungs: CTAB no crackles, wheeze, rhonchi Abdomen: obese Ext: no edema Skin: warm, dry  Diabetic Foot Exam - Simple   Simple Foot Form Diabetic Foot exam was performed with the following findings:  Yes 03/06/2016  9:19 AM  Visual Inspection No deformities, no ulcerations, no other skin breakdown bilaterally:  Yes Sensation Testing Intact to touch and monofilament testing bilaterally:  Yes Pulse Check Posterior Tibialis and Dorsalis pulse intact bilaterally:  Yes Comments Dry feet    Assessment/Plan:  Hypertension S: controlled on losartan 100mg - on repeat. .  BP Readings from Last 3 Encounters:  03/06/16 118/84  12/02/15 120/88  08/19/15 130/90  A/P:Continue current meds:  Though we discussed new guidelines- would push for lower weight  Hyperlipidemia S: well controlled on atorvastatin 20mg  (in regards to LDL, triglycerides poor control). No myalgias.  Lab Results  Component Value Date   CHOL 246 (H) 08/13/2015   HDL 36.40 (L) 08/13/2015   LDLCALC 57 07/10/2013   LDLDIRECT 65.0 08/13/2015   TRIG 327.0 (H) 08/13/2015  CHOLHDL 7 08/13/2015   A/P: discussed importance of weight loss for lipid, bp, dm control, continue statin  DM (diabetes mellitus), type 2, uncontrolled (HCC) S: worsening control. On bydureon and metformin 100mg  BID. Did not tolerate invokana. Takes 1/2 an amaryl 2mg  if sugar >150 fasting as of last visit- full pill if going to have a bad meal. Had been doing [employer] for physical activity.  Today, states having to do amaryl twice a day due to dietary indescretions- advised him to tighten this up. Also not exercising so has to restart.  Lab Results  Component Value Date   HGBA1C 7.5 (H) 03/02/2016    HGBA1C 7.1 (H) 11/19/2015   HGBA1C 7.6 (H) 08/13/2015   A/P: we opted to continue current regimen- once again commits to healthier eating and regular exercise. May have to schedule amaryl if a1c gets above 7.5 again.   Anxiety state High stress meetings- 0.5mg  ativan not helpful- took 2 and helpful. Agreed to one per week 1mg  dosing- took off max once a week as they would only fill 4 at a time at pharmacy  3-4 month follow up  Meds ordered this encounter  Medications  . LORazepam (ATIVAN) 1 MG tablet    Sig: Take 0.5 tablets (0.5 mg total) by mouth daily as needed for anxiety.    Dispense:  16 tablet    Refill:  0    Return precautions advised.  Tana ConchStephen Orianna Biskup, MD

## 2016-03-06 NOTE — Assessment & Plan Note (Signed)
S: worsening control. On bydureon and metformin 100mg  BID. Did not tolerate invokana. Takes 1/2 an amaryl 2mg  if sugar >150 fasting as of last visit- full pill if going to have a bad meal. Had been doing [employer] for physical activity.  Today, states having to do amaryl twice a day due to dietary indescretions- advised him to tighten this up. Also not exercising so has to restart.  Lab Results  Component Value Date   HGBA1C 7.5 (H) 03/02/2016   HGBA1C 7.1 (H) 11/19/2015   HGBA1C 7.6 (H) 08/13/2015   A/P: we opted to continue current regimen- once again commits to healthier eating and regular exercise. May have to schedule amaryl if a1c gets above 7.5 again.

## 2016-03-06 NOTE — Assessment & Plan Note (Signed)
S: well controlled on atorvastatin 20mg  (in regards to LDL, triglycerides poor control). No myalgias.  Lab Results  Component Value Date   CHOL 246 (H) 08/13/2015   HDL 36.40 (L) 08/13/2015   LDLCALC 57 07/10/2013   LDLDIRECT 65.0 08/13/2015   TRIG 327.0 (H) 08/13/2015   CHOLHDL 7 08/13/2015   A/P: discussed importance of weight loss for lipid, bp, dm control, continue statin

## 2016-03-06 NOTE — Progress Notes (Signed)
Pre visit review using our clinic review tool, if applicable. No additional management support is needed unless otherwise documented below in the visit note. 

## 2016-04-30 ENCOUNTER — Ambulatory Visit (INDEPENDENT_AMBULATORY_CARE_PROVIDER_SITE_OTHER): Payer: PRIVATE HEALTH INSURANCE | Admitting: Family Medicine

## 2016-04-30 ENCOUNTER — Encounter: Payer: Self-pay | Admitting: Family Medicine

## 2016-04-30 VITALS — BP 144/82 | HR 84 | Temp 97.9°F | Ht 66.0 in | Wt 244.2 lb

## 2016-04-30 DIAGNOSIS — J069 Acute upper respiratory infection, unspecified: Secondary | ICD-10-CM | POA: Diagnosis not present

## 2016-04-30 LAB — POCT INFLUENZA A/B
INFLUENZA B, POC: NEGATIVE
Influenza A, POC: NEGATIVE

## 2016-04-30 MED ORDER — HYDROCOD POLST-CPM POLST ER 10-8 MG/5ML PO SUER: 5.0000 mL | Freq: Two times a day (BID) | ORAL | 0 refills | Status: DC | PRN

## 2016-04-30 NOTE — Progress Notes (Signed)
PCP: [name], MD  Subjective:  Rickey Anderson is a 61 y.o. year old very pleasant male patient who presents with Upper Respiratory infection symptoms including scratchy sore throat, cough. Minimal runny nose. He was up coughing and having a hard time talking to clients on phone due to cough (working from home -started: yesterday, symptoms are worsening -previous treatments: rest, hydration, cough drops -sick contacts/travel/risks: denies known flu exposure.   ROS-denies fever, SOB, NVD, tooth pain  Pertinent Past Medical History-  Patient Active Problem List   Diagnosis Date Noted  . DM (diabetes mellitus), type 2, uncontrolled (HCC) 09/24/2006    Priority: High  . Anxiety state 12/02/2015    Priority: Medium  . Dyspnea 05/21/2014    Priority: Medium  . Hypertension 03/27/2013    Priority: Medium  . Hyperlipidemia 08/22/2007    Priority: Medium  . ED (erectile dysfunction) 12/02/2015    Priority: Low  . Former smoker 07/19/2014    Priority: Low  . S/P TKR (total knee replacement)bilat 08/01/2012    Priority: Low  . OA (osteoarthritis) of knee 07/27/2012    Priority: Low  . GERD 09/24/2006    Priority: Low     Medications- reviewed  Current Outpatient Prescriptions  Medication Sig Dispense Refill  . atorvastatin (LIPITOR) 20 MG tablet TAKE ONE TABLET BY MOUTH ONE TIME DAILY 90 tablet 2  . BYDUREON 2 MG PEN INJECT 2 MG INTO THE SKIN ONCE A WEEK AS DIRECTED 4 each 5  . glimepiride (AMARYL) 2 MG tablet TAKE ONE TABLET BY MOUTH TWICE DAILY 60 tablet 4  . LORazepam (ATIVAN) 1 MG tablet Take 1 tablet (1 mg total) by mouth daily as needed for anxiety. 16 tablet 0  . losartan (COZAAR) 100 MG tablet TAKE ONE TABLET BY MOUTH ONE TIME DAILY 30 tablet 5  . meloxicam (MOBIC) 7.5 MG tablet 7.5 mg daily as needed.  0  . metFORMIN (GLUCOPHAGE) 1000 MG tablet TAKE ONE TABLET BY MOUTH TWICE DAILY WITH A MEAL 120 tablet 5  . Multiple Vitamin (MULTIVITAMIN) tablet Take 1 tablet  by mouth daily.    Marland Kitchen omeprazole (PRILOSEC) 20 MG capsule Take 1 capsule (20 mg total) by mouth daily. 30 capsule 1  . sildenafil (VIAGRA) 100 MG tablet Take 1 tablet (100 mg total) by mouth daily as needed for erectile dysfunction. 10 tablet 11  . chlorpheniramine-HYDROcodone (TUSSIONEX PENNKINETIC ER) 10-8 MG/5ML SUER Take 5 mLs by mouth every 12 (twelve) hours as needed for cough. 115 mL 0   No current facility-administered medications for this visit.     Objective: BP (!) 144/82 (BP Location: Left Arm, Patient Position: Sitting, Cuff Size: Large)   Pulse 84   Temp 97.9 F (36.6 C) (Oral)   Ht 5\' 6"  (1.676 m)   Wt 244 lb 3.2 oz (110.8 kg)   SpO2 94%   BMI 39.41 kg/m  Gen: NAD, resting comfortably HEENT: Turbinates erythematous, TM normal, pharynx mildly erythematous with no tonsilar exudate or edema, no sinus tenderness CV: RRR no murmurs rubs or gallops Lungs: CTAB no crackles, wheeze, rhonchi Abdomen: soft/nontender/nondistended/normal bowel sounds. No rebound or guarding.  Ext: no edema Skin: warm, dry, no rash Neuro: grossly normal, moves all extremities  Assessment/Plan:  Upper Respiratory infection History and exam today are suggestive of viral infection most likely due to upper respiratory infection. Symptomatic treatment with: tussionex cough syrup  Mr. Shimabukuro primarily presented due to concern for the flu. Fortunately his symptoms did not line up with this  diagnosis and flu test was negative.   We discussed that we did not find any infection that had higher probability of being bacterial such as pneumonia or strep throat. We discussed signs that bacterial infection may have developed particularly fever or shortness of breath. Likely course of 1-2 weeks. Patient is contagious and advised good handwashing and consideration of mask If going to be in public places.   Finally, we reviewed reasons to return to care including if symptoms worsen or persist or new concerns  arise- once again particularly shortness of breath or fever.  Meds ordered this encounter  Medications  . chlorpheniramine-HYDROcodone (TUSSIONEX PENNKINETIC ER) 10-8 MG/5ML SUER    Sig: Take 5 mLs by mouth every 12 (twelve) hours as needed for cough.    Dispense:  115 mL    Refill:  0    Tana ConchStephen Hunter, MD

## 2016-04-30 NOTE — Progress Notes (Signed)
Pre visit review using our clinic review tool, if applicable. No additional management support is needed unless otherwise documented below in the visit note. 

## 2016-04-30 NOTE — Patient Instructions (Signed)
Upper Respiratory infection History and exam today are suggestive of viral infection most likely due to upper respiratory infection. Symptomatic treatment with: tussionex cough syrup  Mr. Rickey Anderson primarily presented due to concern for the flu. Fortunately his symptoms did not line up with this diagnosis and flu test was negative.   We discussed that we did not find any infection that had higher probability of being bacterial such as pneumonia or strep throat. We discussed signs that bacterial infection may have developed particularly fever or shortness of breath. Likely course of 1-2 weeks. Patient is contagious and advised good handwashing and consideration of mask If going to be in public places.   Finally, we reviewed reasons to return to care including if symptoms worsen or persist or new concerns arise- once again particularly shortness of breath or fever.  Meds ordered this encounter  Medications  . chlorpheniramine-HYDROcodone (TUSSIONEX PENNKINETIC ER) 10-8 MG/5ML SUER    Sig: Take 5 mLs by mouth every 12 (twelve) hours as needed for cough.    Dispense:  115 mL    Refill:  0

## 2016-04-30 NOTE — Addendum Note (Signed)
Addended by: Rivka SaferSOUTHERN HIZER, Asher MuirJAMIE M on: 04/30/2016 11:50 AM   Modules accepted: Orders

## 2016-05-12 ENCOUNTER — Other Ambulatory Visit: Payer: Self-pay | Admitting: Family Medicine

## 2016-05-20 LAB — HM DIABETES EYE EXAM

## 2016-05-21 ENCOUNTER — Encounter: Payer: Self-pay | Admitting: Family Medicine

## 2016-06-05 ENCOUNTER — Encounter: Payer: Self-pay | Admitting: Family Medicine

## 2016-06-08 ENCOUNTER — Encounter: Payer: Self-pay | Admitting: Family Medicine

## 2016-06-08 ENCOUNTER — Ambulatory Visit (INDEPENDENT_AMBULATORY_CARE_PROVIDER_SITE_OTHER): Payer: PRIVATE HEALTH INSURANCE | Admitting: Family Medicine

## 2016-06-08 DIAGNOSIS — IMO0001 Reserved for inherently not codable concepts without codable children: Secondary | ICD-10-CM

## 2016-06-08 DIAGNOSIS — E1165 Type 2 diabetes mellitus with hyperglycemia: Secondary | ICD-10-CM | POA: Diagnosis not present

## 2016-06-08 DIAGNOSIS — I10 Essential (primary) hypertension: Secondary | ICD-10-CM | POA: Diagnosis not present

## 2016-06-08 DIAGNOSIS — F411 Generalized anxiety disorder: Secondary | ICD-10-CM | POA: Diagnosis not present

## 2016-06-08 DIAGNOSIS — E785 Hyperlipidemia, unspecified: Secondary | ICD-10-CM

## 2016-06-08 LAB — COMPREHENSIVE METABOLIC PANEL
ALK PHOS: 48 U/L (ref 39–117)
ALT: 41 U/L (ref 0–53)
AST: 29 U/L (ref 0–37)
Albumin: 4.6 g/dL (ref 3.5–5.2)
BUN: 30 mg/dL — ABNORMAL HIGH (ref 6–23)
CO2: 27 mEq/L (ref 19–32)
CREATININE: 1.28 mg/dL (ref 0.40–1.50)
Calcium: 10.6 mg/dL — ABNORMAL HIGH (ref 8.4–10.5)
Chloride: 101 mEq/L (ref 96–112)
GFR: 60.79 mL/min (ref >60.00)
GLUCOSE: 180 mg/dL — AB (ref 70–99)
Potassium: 5.4 mEq/L — ABNORMAL HIGH (ref 3.5–5.1)
Sodium: 136 mEq/L (ref 135–145)
TOTAL PROTEIN: 6.9 g/dL (ref 6.0–8.3)
Total Bilirubin: 0.6 mg/dL (ref 0.2–1.2)

## 2016-06-08 LAB — HEMOGLOBIN A1C: Hgb A1c MFr Bld: 7.9 % — ABNORMAL HIGH (ref 4.6–6.5)

## 2016-06-08 LAB — LDL CHOLESTEROL, DIRECT: Direct LDL: 26 mg/dL

## 2016-06-08 LAB — TRIGLYCERIDES: TRIGLYCERIDES: 152 mg/dL — AB (ref 0.0–149.0)

## 2016-06-08 MED ORDER — LORAZEPAM 1 MG PO TABS: 1.0000 mg | ORAL_TABLET | Freq: Every day | ORAL | 0 refills | Status: DC | PRN

## 2016-06-08 MED ORDER — LIRAGLUTIDE 18 MG/3ML ~~LOC~~ SOPN: 1.2000 mg | PEN_INJECTOR | Freq: Every day | SUBCUTANEOUS | 5 refills | Status: DC

## 2016-06-08 NOTE — Patient Instructions (Signed)
Get labs then decide if we want to retrial victoza.   Wt Readings from Last 3 Encounters:  06/08/16 238 lb 3.2 oz (108 kg)  04/30/16 244 lb 3.2 oz (110.8 kg)  Keep up the great work!

## 2016-06-08 NOTE — Assessment & Plan Note (Signed)
Ativan once a week max avg for work meetings. #16 given 4 months ago. Refilled today

## 2016-06-08 NOTE — Assessment & Plan Note (Signed)
S: reasonably controlled on last check with LDL 65, triglycerides were high though- needs weight loss. Compliant with atorvastatin 20mg . No myalgias.   A/P: update ldl and triglycerides today

## 2016-06-08 NOTE — Assessment & Plan Note (Signed)
S: suspect improving controlled. On metformin 1g BID. stopped bydureon 2mg  a month ago. Also has amaryl on hand- taking on average once a week. Lows in the past with daily use.  CBGs- 119 lowest fasting , highest around 170. Did have a splurge one night with large sweet and got up to 288 as outlier.  Exercise and diet- skiing on weekends, [employer] during the week. Sometimes diet really clean and sometimes making poor choices Lab Results  Component Value Date   HGBA1C 7.5 (H) 03/02/2016   HGBA1C 7.1 (H) 11/19/2015   HGBA1C 7.6 (H) 08/13/2015   A/P: will update a1c today. Getting blood stains on shirts from the bydureon and takes him a while to stop the bleeding. Wants to retrial victoza if needed.

## 2016-06-08 NOTE — Assessment & Plan Note (Signed)
S: controlled on  losartan 100mg  BP Readings from Last 3 Encounters:  06/08/16 130/70  04/30/16 (!) 144/82  03/06/16 118/84  A/P:Continue current meds:  Doing well

## 2016-06-08 NOTE — Progress Notes (Signed)
Pre visit review using our clinic review tool, if applicable. No additional management support is needed unless otherwise documented below in the visit note. 

## 2016-06-08 NOTE — Progress Notes (Signed)
Subjective:  Rickey Anderson is a 61 y.o. year old very pleasant male patient who presents for/with See problem oriented charting ROS- no hypoglycemia. Has had some hyperglycemia. No chest pain or shortness of breath   Past Medical History-  Patient Active Problem List   Diagnosis Date Noted  . DM (diabetes mellitus), type 2, uncontrolled (HCC) 09/24/2006    Priority: High  . Anxiety state 12/02/2015    Priority: Medium  . Dyspnea 05/21/2014    Priority: Medium  . Hypertension 03/27/2013    Priority: Medium  . Hyperlipidemia 08/22/2007    Priority: Medium  . ED (erectile dysfunction) 12/02/2015    Priority: Low  . Former smoker 07/19/2014    Priority: Low  . S/P TKR (total knee replacement)bilat 08/01/2012    Priority: Low  . OA (osteoarthritis) of knee 07/27/2012    Priority: Low  . GERD 09/24/2006    Priority: Low    Medications- reviewed and updated Current Outpatient Prescriptions  Medication Sig Dispense Refill  . atorvastatin (LIPITOR) 20 MG tablet TAKE ONE TABLET BY MOUTH ONE TIME DAILY 90 tablet 2  . glimepiride (AMARYL) 2 MG tablet TAKE ONE TABLET BY MOUTH TWICE DAILY 60 tablet 2  . LORazepam (ATIVAN) 1 MG tablet Take 1 tablet (1 mg total) by mouth daily as needed for anxiety. 16 tablet 0  . losartan (COZAAR) 100 MG tablet TAKE 1 TABLET BY MOUTH DAILY 30 tablet 2  . meloxicam (MOBIC) 7.5 MG tablet 7.5 mg daily as needed.  0  . metFORMIN (GLUCOPHAGE) 1000 MG tablet TAKE ONE TABLET BY MOUTH TWICE DAILY WITH A MEAL 120 tablet 5  . Multiple Vitamin (MULTIVITAMIN) tablet Take 1 tablet by mouth daily.    Marland Kitchen. omeprazole (PRILOSEC) 20 MG capsule Take 1 capsule (20 mg total) by mouth daily. 30 capsule 1   No current facility-administered medications for this visit.     Objective: BP 130/70 (BP Location: Left Arm, Patient Position: Sitting, Cuff Size: Large)   Pulse 92   Temp 97.9 F (36.6 C) (Oral)   Ht 5\' 6"  (1.676 m)   Wt 238 lb 3.2 oz (108 kg)   SpO2 94%   BMI  38.45 kg/m  Gen: NAD, resting comfortably CV: RRR no murmurs rubs or gallops Lungs: CTAB no crackles, wheeze, rhonchi Abdomen: soft/nontender/nondistended/normal bowel sounds. obese Ext: no edema  Skin: warm, dry  Assessment/Plan:  DM (diabetes mellitus), type 2, uncontrolled (HCC) S: suspect improving controlled. On metformin 1g BID. stopped bydureon 2mg  a month ago. Also has amaryl on hand- taking on average once a week. Lows in the past with daily use.  CBGs- 119 lowest fasting , highest around 170. Did have a splurge one night with large sweet and got up to 288 as outlier.  Exercise and diet- skiing on weekends, [employer] during the week. Sometimes diet really clean and sometimes making poor choices Lab Results  Component Value Date   HGBA1C 7.5 (H) 03/02/2016   HGBA1C 7.1 (H) 11/19/2015   HGBA1C 7.6 (H) 08/13/2015   A/P: will update a1c today. Getting blood stains on shirts from the bydureon and takes him a while to stop the bleeding. Wants to retrial victoza if needed.   Hypertension S: controlled on  losartan 100mg  BP Readings from Last 3 Encounters:  06/08/16 130/70  04/30/16 (!) 144/82  03/06/16 118/84  A/P:Continue current meds:  Doing well  Hyperlipidemia S: reasonably controlled on last check with LDL 65, triglycerides were high though- needs weight  loss. Compliant with atorvastatin 20mg . No myalgias.   A/P: update ldl and triglycerides today  Anxiety state Ativan once a week max avg for work meetings. #16 given 4 months ago. Refilled today  14 weeks  Orders Placed This Encounter  Procedures  . Comprehensive metabolic panel    [name]  . LDL cholesterol, direct    [name]  . Hemoglobin A1c    [name]  . Triglycerides   Meds ordered this encounter  Medications  . LORazepam (ATIVAN) 1 MG tablet    Sig: Take 1 tablet (1 mg total) by mouth daily as needed for anxiety.    Dispense:  16 tablet    Refill:  0   Return precautions advised.   [name], MD

## 2016-06-08 NOTE — Addendum Note (Signed)
Addended by: Shelva MajesticHUNTER, Tank O on: 06/08/2016 08:28 PM   Modules accepted: Orders

## 2016-06-09 ENCOUNTER — Encounter: Payer: Self-pay | Admitting: Family Medicine

## 2016-06-09 ENCOUNTER — Other Ambulatory Visit: Payer: Self-pay

## 2016-06-09 DIAGNOSIS — E875 Hyperkalemia: Secondary | ICD-10-CM

## 2016-06-10 ENCOUNTER — Other Ambulatory Visit (INDEPENDENT_AMBULATORY_CARE_PROVIDER_SITE_OTHER): Payer: PRIVATE HEALTH INSURANCE

## 2016-06-10 DIAGNOSIS — E875 Hyperkalemia: Secondary | ICD-10-CM

## 2016-06-10 LAB — BASIC METABOLIC PANEL
BUN: 25 mg/dL — ABNORMAL HIGH (ref 6–23)
CALCIUM: 9.5 mg/dL (ref 8.4–10.5)
CO2: 27 mEq/L (ref 19–32)
Chloride: 102 mEq/L (ref 96–112)
Creatinine, Ser: 1.11 mg/dL (ref 0.40–1.50)
GFR: 71.66 mL/min (ref >60.00)
GLUCOSE: 172 mg/dL — AB (ref 70–99)
Potassium: 5 mEq/L (ref 3.5–5.1)
SODIUM: 137 meq/L (ref 135–145)

## 2016-06-16 ENCOUNTER — Other Ambulatory Visit: Payer: Self-pay

## 2016-06-16 MED ORDER — PEN NEEDLES 32G X 4 MM MISC: 1.0000 "pen " | 3 refills | Status: AC

## 2016-08-17 ENCOUNTER — Telehealth: Payer: Self-pay | Admitting: Family Medicine

## 2016-08-17 NOTE — Telephone Encounter (Signed)
Pt states he has had a problem getting his blood sugars under 200.  It has even gotten as high as 300 one time.  Pt was foggy headed and throwing up when it got that high. Pt wants to know if OK to increase   Liraglutide 1.2 mg  To 1.6 mg  Pt states he is traveling for the next 2 weeks , but has a follow up appt on 09/07/16  Please advise.

## 2016-08-17 NOTE — Telephone Encounter (Signed)
May increase dose to 1.8mg  victoza/liraglutide daily. Please send in new rx with this increase Asher MuirJamie

## 2016-08-18 ENCOUNTER — Other Ambulatory Visit: Payer: Self-pay

## 2016-08-18 MED ORDER — LIRAGLUTIDE 18 MG/3ML ~~LOC~~ SOPN: 1.8000 mg | PEN_INJECTOR | Freq: Every day | SUBCUTANEOUS | 5 refills | Status: DC

## 2016-08-18 NOTE — Telephone Encounter (Signed)
Prescription sent in to the pharmacy. I called the patient and reviewed over his new dosage and let him know that I had sent in the new dose to the pharmacy

## 2016-08-20 ENCOUNTER — Other Ambulatory Visit: Payer: Self-pay | Admitting: Family Medicine

## 2016-09-03 ENCOUNTER — Other Ambulatory Visit: Payer: Self-pay

## 2016-09-03 ENCOUNTER — Telehealth: Payer: Self-pay | Admitting: Family Medicine

## 2016-09-03 MED ORDER — LIRAGLUTIDE 18 MG/3ML ~~LOC~~ SOPN: 1.8000 mg | PEN_INJECTOR | Freq: Every day | SUBCUTANEOUS | 5 refills | Status: AC

## 2016-09-03 MED ORDER — LIRAGLUTIDE 18 MG/3ML ~~LOC~~ SOPN: 1.8000 mg | PEN_INJECTOR | Freq: Every day | SUBCUTANEOUS | 5 refills | Status: DC

## 2016-09-03 NOTE — Telephone Encounter (Signed)
Prescription sent to pharmacy as requested.

## 2016-09-03 NOTE — Telephone Encounter (Signed)
Pt calling need new Rx for liraglutide due to him being increased by Dr. Durene CalHunter.   Pharm:  [facility] on [address]

## 2016-09-05 ENCOUNTER — Other Ambulatory Visit: Payer: Self-pay | Admitting: Family Medicine

## 2016-09-07 ENCOUNTER — Other Ambulatory Visit: Payer: Self-pay | Admitting: Family Medicine

## 2016-09-07 ENCOUNTER — Ambulatory Visit: Payer: PRIVATE HEALTH INSURANCE | Admitting: Family Medicine

## 2016-09-07 NOTE — Telephone Encounter (Signed)
Received a fax from the pharmacy.  Needs to be changed to a 90 days supply.

## 2016-09-07 NOTE — Telephone Encounter (Signed)
Was changed to a 3 month supply on 09/03/16

## 2016-09-08 ENCOUNTER — Other Ambulatory Visit: Payer: Self-pay

## 2016-09-08 MED ORDER — LORAZEPAM 1 MG PO TABS: 1.0000 mg | ORAL_TABLET | Freq: Every day | ORAL | 0 refills | Status: DC | PRN

## 2016-09-09 ENCOUNTER — Ambulatory Visit: Payer: PRIVATE HEALTH INSURANCE | Admitting: Family Medicine

## 2016-09-11 ENCOUNTER — Ambulatory Visit (INDEPENDENT_AMBULATORY_CARE_PROVIDER_SITE_OTHER): Payer: PRIVATE HEALTH INSURANCE | Admitting: Family Medicine

## 2016-09-11 ENCOUNTER — Encounter: Payer: Self-pay | Admitting: Family Medicine

## 2016-09-11 VITALS — BP 110/76 | HR 78 | Temp 98.9°F | Ht 66.0 in | Wt 232.8 lb

## 2016-09-11 DIAGNOSIS — Z125 Encounter for screening for malignant neoplasm of prostate: Secondary | ICD-10-CM | POA: Diagnosis not present

## 2016-09-11 DIAGNOSIS — E1165 Type 2 diabetes mellitus with hyperglycemia: Secondary | ICD-10-CM

## 2016-09-11 DIAGNOSIS — E785 Hyperlipidemia, unspecified: Secondary | ICD-10-CM | POA: Diagnosis not present

## 2016-09-11 DIAGNOSIS — I10 Essential (primary) hypertension: Secondary | ICD-10-CM | POA: Diagnosis not present

## 2016-09-11 DIAGNOSIS — M170 Bilateral primary osteoarthritis of knee: Secondary | ICD-10-CM | POA: Diagnosis not present

## 2016-09-11 DIAGNOSIS — IMO0001 Reserved for inherently not codable concepts without codable children: Secondary | ICD-10-CM

## 2016-09-11 DIAGNOSIS — Z0000 Encounter for general adult medical examination without abnormal findings: Secondary | ICD-10-CM

## 2016-09-11 LAB — COMPREHENSIVE METABOLIC PANEL
ALBUMIN: 4.6 g/dL (ref 3.5–5.2)
ALT: 37 U/L (ref 0–53)
AST: 29 U/L (ref 0–37)
Alkaline Phosphatase: 55 U/L (ref 39–117)
BUN: 14 mg/dL (ref 6–23)
CALCIUM: 10.2 mg/dL (ref 8.4–10.5)
CHLORIDE: 100 meq/L (ref 96–112)
CO2: 27 mEq/L (ref 19–32)
CREATININE: 1.47 mg/dL (ref 0.40–1.50)
GFR: 51.77 mL/min — AB (ref >60.00)
Glucose, Bld: 117 mg/dL — ABNORMAL HIGH (ref 70–99)
POTASSIUM: 5 meq/L (ref 3.5–5.1)
Sodium: 137 mEq/L (ref 135–145)
Total Bilirubin: 0.8 mg/dL (ref 0.2–1.2)
Total Protein: 7.2 g/dL (ref 6.0–8.3)

## 2016-09-11 LAB — PSA: PSA: 0.63 ng/mL (ref 0.10–4.00)

## 2016-09-11 LAB — CBC
HEMATOCRIT: 38.6 % — AB (ref 39.0–52.0)
HEMOGLOBIN: 13.2 g/dL (ref 13.0–17.0)
MCHC: 34.1 g/dL (ref 30.0–36.0)
MCV: 92.7 fl (ref 78.0–100.0)
PLATELETS: 363 10*3/uL (ref 150.0–400.0)
RBC: 4.16 Mil/uL — AB (ref 4.22–5.81)
RDW: 14 % (ref 11.5–15.5)
WBC: 9.4 10*3/uL (ref 4.0–10.5)

## 2016-09-11 LAB — HEMOGLOBIN A1C: HEMOGLOBIN A1C: 8.2 % — AB (ref 4.6–6.5)

## 2016-09-11 LAB — LIPID PANEL
CHOLESTEROL: 99 mg/dL (ref 0–200)
HDL: 39.5 mg/dL (ref >39.00)
LDL Cholesterol: 31 mg/dL (ref 0–99)
NonHDL: 59.16
TRIGLYCERIDES: 141 mg/dL (ref 0.0–149.0)
Total CHOL/HDL Ratio: 2
VLDL: 28.2 mg/dL (ref 0.0–40.0)

## 2016-09-11 NOTE — Patient Instructions (Signed)
Great job with weight loss and changing the diet- have to stick with it  Please stop by lab before you go

## 2016-09-11 NOTE — Assessment & Plan Note (Signed)
BP at goal with above meds- losartan 100mg 

## 2016-09-11 NOTE — Assessment & Plan Note (Signed)
Lipids- update today but suspect at goal on atorvastation 20mg 

## 2016-09-11 NOTE — Assessment & Plan Note (Signed)
DM- hopeful improved control with weight down 6 lbs. Went back to victoza with blood stains on shirt with bydureon (had been off 2 months with last a1c of 7.9), taking metformin 1g BID. Had lows on amaryl in past with daily use- uses prn. Blood sugars 125 fasting this AM. About a month ago was not eating well had some sugars in 300s even with amaryl 4mg  (now off). Oatmeal in AM with fruit- much better than prior food choices. Eating healthier foods - increased veggies. Switching jobs and schedule will be better.

## 2016-09-11 NOTE — Assessment & Plan Note (Signed)
Arthritis- stopped mobic due to prior GFR decrease- also had been dehydrated that Am

## 2016-09-11 NOTE — Progress Notes (Signed)
Phone: [phone]  Subjective:  Patient presents today for their annual physical. Chief complaint-noted.   See problem oriented charting- ROS- full  review of systems was completed and negative including No chest pain. some shortness of breath but improving with regular exercise. No headache or blurry vision.   The following were reviewed and entered/updated in epic: Past Medical History:  Diagnosis Date  . Alcohol abuse, in remission   . Diabetes mellitus   . GERD (gastroesophageal reflux disease)   . Herpes   . Hyperlipidemia   . Nonspecific elevation of level of transaminase or lactic acid dehydrogenase (LDH) 11/04/2011   Former alcohol use. Some fatty liver. Resolved 2015 LFTs   . Norovirus    1'14- no reoccurring issues  . Right-sided low back pain without sciatica 02/05/2014   Prednisone from Dr. Shon Baton at [facility]. Resolved with prednisone.     Patient Active Problem List   Diagnosis Date Noted  . DM (diabetes mellitus), type 2, uncontrolled (HCC) 09/24/2006    Priority: High  . Anxiety state 12/02/2015    Priority: Medium  . Dyspnea 05/21/2014    Priority: Medium  . Hypertension 03/27/2013    Priority: Medium  . Hyperlipidemia 08/22/2007    Priority: Medium  . ED (erectile dysfunction) 12/02/2015    Priority: Low  . Former smoker 07/19/2014    Priority: Low  . S/P TKR (total knee replacement)bilat 08/01/2012    Priority: Low  . OA (osteoarthritis) of knee 07/27/2012    Priority: Low  . GERD 09/24/2006    Priority: Low   Past Surgical History:  Procedure Laterality Date  . eardrum     hx. perforated ear drum surgery right  . FUNCTIONAL ENDOSCOPIC SINUS SURGERY     Deviated septum repair 10'13  . KNEE SURGERY Bilateral    both knees-scopes  . TONSILLECTOMY    . TOTAL KNEE ARTHROPLASTY Bilateral 07/27/2012   Procedure: TOTAL KNEE BILATERAL;  Surgeon: Loanne Drilling, MD;  Location: [facility];  Service: Orthopedics;  Laterality: Bilateral;  .  VASECTOMY      Family History  Problem Relation Age of Onset  . Hypertension Father   . Heart disease Father        Amyloidosis  . Depression Unknown   . Prostate cancer Neg Hx   . Colon cancer Neg Hx     Medications- reviewed and updated Current Outpatient Prescriptions  Medication Sig Dispense Refill  . atorvastatin (LIPITOR) 20 MG tablet TAKE ONE TABLET BY MOUTH ONE TIME DAILY 90 tablet 1  . BD PEN NEEDLE NANO U/F 32G X 4 MM MISC 1 pen by Does not apply route daily.    Marland Kitchen glimepiride (AMARYL) 2 MG tablet TAKE ONE TABLET BY MOUTH TWICE DAILY 60 tablet 2  . liraglutide 18 MG/3ML SOPN Inject 0.3 mLs (1.8 mg total) into the skin daily. 9 mL 5  . LORazepam (ATIVAN) 1 MG tablet Take 1 tablet (1 mg total) by mouth daily as needed for anxiety. 16 tablet 0  . losartan (COZAAR) 100 MG tablet TAKE 1 TABLET BY MOUTH DAILY 30 tablet 2  . meloxicam (MOBIC) 7.5 MG tablet 7.5 mg daily as needed.  0  . metFORMIN (GLUCOPHAGE) 1000 MG tablet TAKE ONE TABLET BY MOUTH TWICE DAILY WITH A MEAL 120 tablet 5  . Multiple Vitamin (MULTIVITAMIN) tablet Take 1 tablet by mouth daily.    Marland Kitchen omeprazole (PRILOSEC) 20 MG capsule Take 1 capsule (20 mg total) by mouth daily. 30 capsule 1  No current facility-administered medications for this visit.     Allergies-reviewed and updated No Known Allergies  Social History   Social History  . Marital status: Married    Spouse name: N/A  . Number of children: N/A  . Years of education: N/A   Occupational History  . Technical sales [employer]   Social History Main Topics  . Smoking status: Former Smoker    Quit date: 07/18/1997  . Smokeless tobacco: Never Used  . Alcohol use No  . Drug use: No  . Sexual activity: Yes    Birth control/ protection: None   Other Topics Concern  . None   Social History Narrative   Married 17 years in 2017. 2 children - son, daugther. 1 stepchild. 1 stepgrandson 61 years old. 1 granddaughter 1 year  old in 2017. Daughter to be getting married soon- 61 year old stepchild.     Objective: BP 110/76 (BP Location: Left Arm, Patient Position: Sitting, Cuff Size: Large)   Pulse 78   Temp 98.9 F (37.2 C) (Oral)   Ht 5\' 6"  (1.676 m)   Wt 232 lb 12.8 oz (105.6 kg)   SpO2 95%   BMI 37.57 kg/m  Gen: NAD, resting comfortably HEENT: Mucous membranes are moist. Oropharynx normal Neck: no thyromegaly CV: RRR no murmurs rubs or gallops Lungs: CTAB no crackles, wheeze, rhonchi Abdomen: soft/nontender/nondistended/normal bowel sounds. No rebound or guarding.  Ext: no edema Skin: warm, dry Neuro: grossly normal, moves all extremities, PERRLA Rectal: normal tone, normal sized prostate- large normal, no masses or tenderness  Assessment/Plan:  61 y.o. male presenting for annual physical.  Health Maintenance counseling: 1. Anticipatory guidance: Patient counseled regarding regular dental exams q6 months advised- seeing yearly, eye exams - yearly, wearing seatbelts.  2. Risk factor reduction:  Advised patient of need for regular exercise and diet rich and fruits and vegetables to reduce risk of heart attack and stroke. Exercise- excellent at [employer] 5 days a week most days. Diet-drastic improvement in last 2 weeks- admits to some poor choices prior to that so a1c could be up.  Wt Readings from Last 3 Encounters:  09/11/16 232 lb 12.8 oz (105.6 kg)  06/08/16 238 lb 3.2 oz (108 kg)  04/30/16 244 lb 3.2 oz (110.8 kg)  3. Immunizations/screenings/ancillary studies- offered shingrix - had shingles in last 2 months- wants to wait until next year Immunization History  Administered Date(s) Administered  . Influenza,inj,Quad PF,36+ Mos 01/29/2014  . Pneumococcal Polysaccharide-23 07/12/2013  . Td 04/16/1997, 08/22/2007  4. Prostate cancer screening-  update psa. Low risk rectal exam and prior trend.  Lab Results  Component Value Date   PSA 0.54 08/13/2015   PSA 0.57 07/10/2013   PSA 0.51  01/04/2012   5. Colon cancer screening - 09/05/07 with 10 year repeat- no adenomatous change on last polyp 6. Skin cancer screening- dermatology a year ago- no new skin lesions since that time   Status of chronic or acute concerns   Gerd- on prilosec- using less with better diet  DM (diabetes mellitus), type 2, uncontrolled (HCC) DM- hopeful improved control with weight down 6 lbs. Went back to victoza with blood stains on shirt with bydureon (had been off 2 months with last a1c of 7.9), taking metformin 1g BID. Had lows on amaryl in past with daily use- uses prn. Blood sugars 125 fasting this AM. About a month ago was not eating well had some sugars in 300s even  with amaryl 4mg  (now off). Oatmeal in AM with fruit- much better than prior food choices. Eating healthier foods - increased veggies. Switching jobs and schedule will be better.   Hypertension BP at goal with above meds- losartan 100mg   Hyperlipidemia Lipids- update today but suspect at goal on atorvastation 20mg   OA (osteoarthritis) of knee Arthritis- stopped mobic due to prior GFR decrease- also had been dehydrated that Am  Return in about 14 weeks (around 12/18/2016) for follow up- or sooner if needed.  Orders Placed This Encounter  Procedures  . CBC    [place]  . Comprehensive metabolic panel    [place]    Order Specific Question:   Has the patient fasted?    Answer:   No  . PSA  . Lipid panel    [place]    Order Specific Question:   Has the patient fasted?    Answer:   No  . Hemoglobin A1c    [place]    Meds ordered this encounter  Medications  . BD PEN NEEDLE NANO U/F 32G X 4 MM MISC    Sig: 1 pen by Does not apply route daily.   Return precautions advised.  [name], MD

## 2016-09-28 ENCOUNTER — Encounter: Payer: PRIVATE HEALTH INSURANCE | Admitting: Family Medicine

## 2016-10-19 ENCOUNTER — Telehealth: Payer: Self-pay

## 2016-10-19 NOTE — Telephone Encounter (Signed)
May give #30 but needs to last at least 2 months so have him use accordingly. Will not refill before 12/19/16 and would prefer he use once weekly.

## 2016-10-19 NOTE — Telephone Encounter (Signed)
Patient came by the office and asked for a refill on his LORazepam (ATIVAN) 1 MG tablet. He states he just started a new job and he has been taking it regularly to help with the meetings and training he has had to do. He states he knows you only wanted him to take 1 a week. Please advise

## 2016-10-21 ENCOUNTER — Other Ambulatory Visit: Payer: Self-pay

## 2016-10-21 MED ORDER — LORAZEPAM 1 MG PO TABS: 1.0000 mg | ORAL_TABLET | ORAL | 0 refills | Status: DC

## 2016-10-21 NOTE — Telephone Encounter (Signed)
Spoke with patient who  Verbalized understanding

## 2016-10-22 ENCOUNTER — Telehealth: Payer: Self-pay | Admitting: Family Medicine

## 2016-10-22 NOTE — Telephone Encounter (Signed)
Pharm needs clarification on lorazepam

## 2016-10-23 NOTE — Telephone Encounter (Signed)
Called pharmacy and clarified order as requested

## 2016-11-07 ENCOUNTER — Other Ambulatory Visit: Payer: Self-pay | Admitting: Family Medicine

## 2016-11-09 ENCOUNTER — Other Ambulatory Visit: Payer: Self-pay | Admitting: Family Medicine

## 2016-12-10 ENCOUNTER — Other Ambulatory Visit: Payer: Self-pay | Admitting: Family Medicine

## 2016-12-21 ENCOUNTER — Encounter: Payer: Self-pay | Admitting: Family Medicine

## 2016-12-21 ENCOUNTER — Ambulatory Visit (INDEPENDENT_AMBULATORY_CARE_PROVIDER_SITE_OTHER): Payer: PRIVATE HEALTH INSURANCE | Admitting: Family Medicine

## 2016-12-21 VITALS — BP 114/80 | HR 79 | Temp 98.3°F | Ht 66.0 in | Wt 232.4 lb

## 2016-12-21 DIAGNOSIS — I10 Essential (primary) hypertension: Secondary | ICD-10-CM | POA: Diagnosis not present

## 2016-12-21 DIAGNOSIS — E1159 Type 2 diabetes mellitus with other circulatory complications: Secondary | ICD-10-CM | POA: Diagnosis not present

## 2016-12-21 DIAGNOSIS — Z79899 Other long term (current) drug therapy: Secondary | ICD-10-CM

## 2016-12-21 DIAGNOSIS — E1165 Type 2 diabetes mellitus with hyperglycemia: Secondary | ICD-10-CM | POA: Diagnosis not present

## 2016-12-21 DIAGNOSIS — I152 Hypertension secondary to endocrine disorders: Secondary | ICD-10-CM

## 2016-12-21 DIAGNOSIS — F411 Generalized anxiety disorder: Secondary | ICD-10-CM

## 2016-12-21 LAB — CBC
HEMATOCRIT: 38.6 % — AB (ref 39.0–52.0)
HEMOGLOBIN: 13.1 g/dL (ref 13.0–17.0)
MCHC: 33.8 g/dL (ref 30.0–36.0)
MCV: 93.1 fl (ref 78.0–100.0)
PLATELETS: 327 10*3/uL (ref 150.0–400.0)
RBC: 4.15 Mil/uL — AB (ref 4.22–5.81)
RDW: 14 % (ref 11.5–15.5)
WBC: 8.5 10*3/uL (ref 4.0–10.5)

## 2016-12-21 LAB — BASIC METABOLIC PANEL
BUN: 15 mg/dL (ref 6–23)
CHLORIDE: 99 meq/L (ref 96–112)
CO2: 26 meq/L (ref 19–32)
Calcium: 9.7 mg/dL (ref 8.4–10.5)
Creatinine, Ser: 1.04 mg/dL (ref 0.40–1.50)
GFR: 77.11 mL/min (ref >60.00)
Glucose, Bld: 153 mg/dL — ABNORMAL HIGH (ref 70–99)
POTASSIUM: 4.7 meq/L (ref 3.5–5.1)
Sodium: 135 mEq/L (ref 135–145)

## 2016-12-21 LAB — VITAMIN B12: VITAMIN B 12: 1410 pg/mL — AB (ref 211–911)

## 2016-12-21 LAB — HEMOGLOBIN A1C: HEMOGLOBIN A1C: 7.8 % — AB (ref 4.6–6.5)

## 2016-12-21 MED ORDER — LORAZEPAM 1 MG PO TABS: 1.0000 mg | ORAL_TABLET | Freq: Every day | ORAL | 1 refills | Status: AC | PRN

## 2016-12-21 NOTE — Assessment & Plan Note (Signed)
S: controlled on losartan .  BP Readings from Last 3 Encounters:  12/21/16 114/80  09/11/16 110/76  06/08/16 130/70  A/P: We discussed blood pressure goal of <140/90. Continue current meds:  Will consider trial off depending on creatinine trend

## 2016-12-21 NOTE — Assessment & Plan Note (Signed)
S:  Poorly controlled on last check - On victoza 1.8 mg, metformin 1 g BID, amaryl  when needed- if sugar above 150 fasting. If high in the evenings may take full  pill Exercise and diet-  stable weight from last visit. Doing [employer] and loving it- has new skis for the year and excited about that. Has periods on and off.  Lab Results  Component Value Date   HGBA1C 8.2 (H) 09/11/2016   HGBA1C 7.9 (H) 06/08/2016   HGBA1C 7.5 (H) 03/02/2016   A/P: update a1c- hopeful with his lifestyle changes.

## 2016-12-21 NOTE — Patient Instructions (Addendum)
Please stop by lab before you go  Goal average at least 4 days a week exercising. Continue healthy eating efforts. Goal 5 lbs weight loss by next visit.

## 2016-12-21 NOTE — Assessment & Plan Note (Signed)
S: high stress job with presentations. Back to old job so helping some. Up to 2 a week. Advised no driving for 8 hours after taking this.   Takes 5 hour energy each morning- works for him and cup of coffee- did not advise him to continue this. Apparently does have b12.  A/P:  Refilled #25 ativan and discussed avoiding 5 hour energy as can increase anxiety

## 2016-12-21 NOTE — Progress Notes (Signed)
Subjective:  Rickey Anderson is a 61 y.o. year old very pleasant male patient who presents for/with See problem oriented charting ROS- No chest pain or shortness of breath. No headache or blurry vision. Lightheaded- if takes Bp med before intense workout so has move tto after   Past Medical History-  Patient Active Problem List   Diagnosis Date Noted  . DM (diabetes mellitus), type 2, uncontrolled (HCC) 09/24/2006    Priority: High  . Anxiety state 12/02/2015    Priority: Medium  . Dyspnea 05/21/2014    Priority: Medium  . Hypertension associated with diabetes (HCC) 03/27/2013    Priority: Medium  . Hyperlipidemia 08/22/2007    Priority: Medium  . ED (erectile dysfunction) 12/02/2015    Priority: Low  . Former smoker 07/19/2014    Priority: Low  . S/P TKR (total knee replacement)bilat 08/01/2012    Priority: Low  . OA (osteoarthritis) of knee 07/27/2012    Priority: Low  . GERD 09/24/2006    Priority: Low    Medications- reviewed and updated Current Outpatient Prescriptions  Medication Sig Dispense Refill  . atorvastatin (LIPITOR) 20 MG tablet TAKE ONE TABLET BY MOUTH ONE TIME DAILY 90 tablet 1  . BD PEN NEEDLE NANO U/F 32G X 4 MM MISC 1 pen by Does not apply route daily.    Marland Kitchen glimepiride (AMARYL) 2 MG tablet TAKE ONE TABLET BY MOUTH TWICE DAILY 60 tablet 2  . liraglutide 18 MG/3ML SOPN Inject 0.3 mLs (1.8 mg total) into the skin daily. 9 mL 5  . LORazepam (ATIVAN) 1 MG tablet Take 1 tablet (1 mg total) by mouth daily as needed for anxiety (twice a week maximum). 1 refill per 3 months 25 tablet 1  . losartan (COZAAR) 100 MG tablet TAKE 1 TABLET BY MOUTH DAILY 30 tablet 2  . metFORMIN (GLUCOPHAGE) 1000 MG tablet TAKE ONE TABLET BY MOUTH TWICE DAILY WITH A MEAL 120 tablet 1  . Multiple Vitamin (MULTIVITAMIN) tablet Take 1 tablet by mouth daily.    Marland Kitchen omeprazole (PRILOSEC) 20 MG capsule Take 1 capsule (20 mg total) by mouth daily. 30 capsule 1   No current  facility-administered medications for this visit.     Objective: BP 114/80 (BP Location: Left Arm, Patient Position: Sitting, Cuff Size: Large)   Pulse 79   Temp 98.3 F (36.8 C) (Oral)   Ht  (1.676 m)   Wt 232 lb 6.4 oz (105.4 kg)   SpO2 96%   BMI 37.51 kg/m  Gen: NAD, resting comfortably CV: RRR no murmurs rubs or gallops Lungs: CTAB no crackles, wheeze, rhonchi Abdomen: soft/nontender/nondistended/normal bowel sounds. obese Ext: no edema  Assessment/Plan: DM (diabetes mellitus), type 2, uncontrolled (HCC) S:  Poorly controlled on last check - On victoza 1.8 mg, metformin 1 g BID, amaryl  when needed- if sugar above 150 fasting. If high in the evenings may take full  pill Exercise and diet-  stable weight from last visit. Doing [employer] and loving it- has new skis for the year and excited about that. Has periods on and off.  Lab Results  Component Value Date   HGBA1C 8.2 (H) 09/11/2016   HGBA1C 7.9 (H) 06/08/2016   HGBA1C 7.5 (H) 03/02/2016   A/P: update a1c- hopeful with his lifestyle changes.   Hypertension associated with diabetes (HCC) S: controlled on losartan .  BP Readings from Last 3 Encounters:  12/21/16 114/80  09/11/16 110/76  06/08/16 130/70  A/P: We discussed blood pressure goal  of <140/90. Continue current meds:  Will consider trial off depending on creatinine trend   Anxiety state S: high stress job with presentations. Back to old job so helping some. Up to 2 a week. Advised no driving for 8 hours after taking this.   Takes 5 hour energy each morning- works for him and cup of coffee- did not advise him to continue this. Apparently does have b12.  A/P:  Refilled #25 ativan and discussed avoiding 5 hour energy as can increase anxiety  Return in about 14 weeks (around 03/29/2017) for follow up- or sooner if needed.  Orders Placed This Encounter  Procedures  . CBC    [place]  . Basic metabolic panel    [place]  . Hemoglobin A1c     [place]  . Vitamin B12    Meds ordered this encounter  Medications  . LORazepam (ATIVAN) 1 MG tablet    Sig: Take 1 tablet (1 mg total) by mouth daily as needed for anxiety (twice a week maximum). 1 refill per 3 months    Dispense:  25 tablet    Refill:  1    Return precautions advised.  [name], MD

## 2017-01-21 ENCOUNTER — Other Ambulatory Visit: Payer: Self-pay

## 2017-01-21 MED ORDER — GLIMEPIRIDE 2 MG PO TABS: 2.0000 mg | ORAL_TABLET | Freq: Two times a day (BID) | ORAL | 5 refills | Status: AC

## 2017-02-10 ENCOUNTER — Other Ambulatory Visit: Payer: Self-pay | Admitting: *Deleted

## 2017-02-10 MED ORDER — LOSARTAN POTASSIUM 100 MG PO TABS: 100.0000 mg | ORAL_TABLET | Freq: Every day | ORAL | 1 refills | Status: AC

## 2017-03-29 ENCOUNTER — Ambulatory Visit (INDEPENDENT_AMBULATORY_CARE_PROVIDER_SITE_OTHER): Payer: PRIVATE HEALTH INSURANCE | Admitting: Family Medicine

## 2017-03-29 ENCOUNTER — Encounter: Payer: Self-pay | Admitting: Family Medicine

## 2017-03-29 VITALS — BP 114/70 | HR 82 | Temp 98.4°F | Ht 66.0 in | Wt 233.2 lb

## 2017-03-29 DIAGNOSIS — E1165 Type 2 diabetes mellitus with hyperglycemia: Secondary | ICD-10-CM | POA: Diagnosis not present

## 2017-03-29 DIAGNOSIS — F411 Generalized anxiety disorder: Secondary | ICD-10-CM

## 2017-03-29 DIAGNOSIS — E1159 Type 2 diabetes mellitus with other circulatory complications: Secondary | ICD-10-CM | POA: Diagnosis not present

## 2017-03-29 DIAGNOSIS — I152 Hypertension secondary to endocrine disorders: Secondary | ICD-10-CM

## 2017-03-29 DIAGNOSIS — I10 Essential (primary) hypertension: Secondary | ICD-10-CM | POA: Diagnosis not present

## 2017-03-29 LAB — HEMOGLOBIN A1C: Hgb A1c MFr Bld: 7.9 % — ABNORMAL HIGH (ref 4.6–6.5)

## 2017-03-29 LAB — BASIC METABOLIC PANEL
BUN: 17 mg/dL (ref 6–23)
CO2: 30 meq/L (ref 19–32)
Calcium: 9.8 mg/dL (ref 8.4–10.5)
Chloride: 97 mEq/L (ref 96–112)
Creatinine, Ser: 1.22 mg/dL (ref 0.40–1.50)
GFR: 64.08 mL/min (ref >60.00)
GLUCOSE: 273 mg/dL — AB (ref 70–99)
POTASSIUM: 4.8 meq/L (ref 3.5–5.1)
Sodium: 134 mEq/L — ABNORMAL LOW (ref 135–145)

## 2017-03-29 NOTE — Assessment & Plan Note (Signed)
S:  last visit refilled his ativan. We discussed using #25 maximum over 3 months- he had been using about twice a week- would really prefer once a week maximum. Had also advised avoiding 5 hour energy.  Today he states this worked well but he has run out.  A/P: we reviewed that he has 1 refill at pharmacy with hopes that would last another 3 months- so using about 8 a month. Willing to refill at time of next visit

## 2017-03-29 NOTE — Assessment & Plan Note (Signed)
S: controlled on losartan 100mg . Had been concerned about GFR trend but rebounded to 77 last visit- continue losartan A/P: blood pressure goal of <140/90. Continue current meds. Recheck bmet next visit. Stay hydrated with heavy exercise

## 2017-03-29 NOTE — Progress Notes (Addendum)
Subjective:  Rickey Anderson is a 62 y.o. year old very pleasant male patient who presents for/with See problem oriented charting ROS- has had some increased urination when cbgs high. No chest pain or shortness of breath. Has had some anxiety.    Past Medical History-  Patient Active Problem List   Diagnosis Date Noted  . DM (diabetes mellitus), type 2, uncontrolled (HCC) 09/24/2006    Priority: High  . Anxiety state 12/02/2015    Priority: Medium  . Dyspnea 05/21/2014    Priority: Medium  . Hypertension associated with diabetes (HCC) 03/27/2013    Priority: Medium  . Hyperlipidemia 08/22/2007    Priority: Medium  . ED (erectile dysfunction) 12/02/2015    Priority: Low  . Former smoker 07/19/2014    Priority: Low  . OA (osteoarthritis) of knee 07/27/2012    Priority: Low  . GERD 09/24/2006    Priority: Low   Medications- reviewed and updated Current Outpatient Medications  Medication Sig Dispense Refill  . atorvastatin (LIPITOR) 20 MG tablet TAKE ONE TABLET BY MOUTH ONE TIME DAILY 90 tablet 1  . BD PEN NEEDLE NANO U/F 32G X 4 MM MISC 1 pen by Does not apply route daily.    Marland Kitchen glimepiride (AMARYL) 2 MG tablet Take 1 tablet (2 mg total) 2 (two) times daily by mouth. 60 tablet 5  . liraglutide 18 MG/3ML SOPN Inject 0.3 mLs (1.8 mg total) into the skin daily. 9 mL 5  . LORazepam (ATIVAN) 1 MG tablet Take 1 tablet (1 mg total) by mouth daily as needed for anxiety (twice a week maximum). 1 refill per 3 months 25 tablet 1  . losartan (COZAAR) 100 MG tablet Take 1 tablet (100 mg total) by mouth daily. 90 tablet 1  . metFORMIN (GLUCOPHAGE) 1000 MG tablet TAKE ONE TABLET BY MOUTH TWICE DAILY WITH A MEAL 120 tablet 1  . Multiple Vitamin (MULTIVITAMIN) tablet Take 1 tablet by mouth daily.    Marland Kitchen omeprazole (PRILOSEC) 20 MG capsule Take 1 capsule (20 mg total) by mouth daily. 30 capsule 1   No current facility-administered medications for this visit.     Objective: BP 114/70 (BP  Location: Left Arm, Patient Position: Sitting, Cuff Size: Large)   Pulse 82   Temp 98.4 F (36.9 C) (Oral)   Ht 5\' 6"  (1.676 m)   Wt 233 lb 3.2 oz (105.8 kg)   SpO2 95%   BMI 37.64 kg/m  Gen: NAD, resting comfortably CV: RRR no murmurs rubs or gallops Lungs: CTAB no crackles, wheeze, rhonchi Abdomen: obese Ext: no edema Skin: warm, dry  Diabetic Foot Exam - Simple   Simple Foot Form Diabetic Foot exam was performed with the following findings:  Yes 03/29/2017 10:32 AM  Visual Inspection No deformities, no ulcerations, no other skin breakdown bilaterally:  Yes Sensation Testing Intact to touch and monofilament testing bilaterally:  Yes Pulse Check Posterior Tibialis and Dorsalis pulse intact bilaterally:  Yes Comments    Assessment/Plan:  Hypertension associated with diabetes (HCC) S: controlled on losartan 100mg . Had been concerned about GFR trend but rebounded to 77 last visit- continue losartan A/P: blood pressure goal of <140/90. Continue current meds. Recheck bmet next visit. Stay hydrated with heavy exercise  DM (diabetes mellitus), type 2, uncontrolled (HCC) S: poorly controlled. On last check but improving. Patient on victoza 1.8mg , metformin 1g BID, amaryl 1mg  prn if sugar fasting above 150 or if high in the evening may take 2mg - has pretty much had to do  twice a day lately CBGs- strongly suspects had some sugars in 300 Exercise and diet-  loving orange theory still- but struggled starting around December 20th and diet really worsened as well. Big time skiier- really enjoys this.   Last 2 days back to vegetarian diet and back to orange theory- sugars in AM around 200. Weight only up 1 lb from last visit despite struggles over holidays Lab Results  Component Value Date   HGBA1C 7.8 (H) 12/21/2016   HGBA1C 8.2 (H) 09/11/2016   HGBA1C 7.9 (H) 06/08/2016  A/P: update a1c- he does not want to add medication- wants to tighten up diet and improve exercise as he has over  last 2 days- agreed if over 9 then would need to add medication. foot exam today.   Dropped BP on invokana. May reduce losartan to 50mg  and add jardiance if needed.   Anxiety state S:  last visit refilled his ativan. We discussed using #25 maximum over 3 months- he had been using about twice a week- would really prefer once a week maximum. Had also advised avoiding 5 hour energy.  Today he states this worked well but he has run out.  A/P: we reviewed that he has 1 refill at pharmacy with hopes that would last another 3 months- so using about 8 a month. Willing to refill at time of next visit  Return in about 14 weeks (around 07/05/2017) for follow up- or sooner if needed. Will need CPE 09/11/17 or later  Order associations: Hypertension associated with diabetes (HCC) - Plan: Basic metabolic panel  Uncontrolled type 2 diabetes mellitus with hyperglycemia (HCC) - Plan: Basic metabolic panel, Hemoglobin A1c  Return precautions advised.  Tana ConchStephen Hunter, MD

## 2017-03-29 NOTE — Assessment & Plan Note (Signed)
S: poorly controlled. On last check but improving. Patient on victoza 1.8mg , metformin 1g BID, amaryl 1mg  prn if sugar fasting above 150 or if high in the evening may take 2mg - has pretty much had to do twice a day lately CBGs- strongly suspects had some sugars in 300 Exercise and diet-  loving orange theory still- but struggled starting around December 20th and diet really worsened as well. Big time skiier- really enjoys this.   Last 2 days back to vegetarian diet and back to orange theory- sugars in AM around 200. Weight only up 1 lb from last visit despite struggles over holidays Lab Results  Component Value Date   HGBA1C 7.8 (H) 12/21/2016   HGBA1C 8.2 (H) 09/11/2016   HGBA1C 7.9 (H) 06/08/2016  A/P: update a1c- he does not want to add medication- wants to tighten up diet and improve exercise as he has over last 2 days- agreed if over 9 then would need to add medication. foot exam today.   Dropped BP on invokana. May reduce losartan to 50mg  and add jardiance if needed.

## 2017-03-29 NOTE — Patient Instructions (Addendum)
Please stop by lab before you go  Goal weight loss at least 5 lbs by follow up. If a1c over 9- consider jardiance but would likely cut losartan in half.   You have a refill on the ativan at your pharmacy good for another 3 months then we can refill if continue to do well at next visit

## 2017-04-15 ENCOUNTER — Other Ambulatory Visit: Payer: Self-pay

## 2017-04-15 MED ORDER — METFORMIN HCL 1000 MG PO TABS: 1000.0000 mg | ORAL_TABLET | Freq: Two times a day (BID) | ORAL | 1 refills | Status: AC

## 2017-05-14 ENCOUNTER — Encounter: Payer: Self-pay | Admitting: Family Medicine

## 2017-05-14 ENCOUNTER — Telehealth: Payer: Self-pay

## 2017-05-15 NOTE — Telephone Encounter (Signed)
No foul play suspected. Spoke to Technical sales engineerofficer. Was common for patient to stay there and wife had been with him there before. There was work in the room that he had been doing. This sounds like he died of influenza and potential complications like pneumonia- certainly wish he had come in for an evaluation. Very kind man and I asked the officers to forward my condolences to family.   I told them they may move the body. I do not plan on autopsy unless family requests. I gave them contact # for wife and daughter  Asher MuirJamie- please change status to deceased.

## 2017-05-17 NOTE — Telephone Encounter (Signed)
Status changed as requested

## 2017-06-14 NOTE — Telephone Encounter (Signed)
error 

## 2017-06-14 NOTE — Telephone Encounter (Signed)
[facility], Ric[facility], To notify Dr. Tana ConchStephen Anderson that they had received a call to come out to [facility] [place] in [place] [address]. When they arrived they broke into the hotel room, which was latched from the inside, and found Rickey Anderson on the couch deceased. They did note a small amount of blood from the nose and felt like it was from a sneeze.   They did call [place] [employer] out who came and investigated. They do not suspect anything other than natural causes at this time.  Per Rickey's statement the [employer] interviewed a maid who stated they had spoken to Rickey Anderson yesterday evening as they were cleaning the room across the hall. The maid stated he stuck his head out and was complaining of "flu like symptoms". The maid noted sneezing, a bad sounding cough, and congestion. The maid tried to get Rickey Anderson to go to the hospital but he refused.  Rickey Anderson states they were able to determine Rickey Anderson was the patient's PCP as his name was on the majority of the prescription bottles in the room. They were calling to get permission to move the body.  I immediately called Rickey Anderson and informed him. I provided him with Rickey Anderson number [phone] and Rickey Anderson will call Rickey Anderson to provide further instructions.

## 2017-06-14 DEATH — deceased

## 2017-07-05 ENCOUNTER — Ambulatory Visit: Payer: PRIVATE HEALTH INSURANCE | Admitting: Family Medicine
